# Patient Record
Sex: Male | Born: 1935
Health system: Southern US, Community
[De-identification: ages and names within clinical notes are randomized; demographics above are authoritative.]

## PROBLEM LIST (undated history)

## (undated) DIAGNOSIS — N400 Enlarged prostate without lower urinary tract symptoms: Secondary | ICD-10-CM

## (undated) DIAGNOSIS — E119 Type 2 diabetes mellitus without complications: Secondary | ICD-10-CM

## (undated) DIAGNOSIS — M75101 Unspecified rotator cuff tear or rupture of right shoulder, not specified as traumatic: Secondary | ICD-10-CM

## (undated) DIAGNOSIS — E559 Vitamin D deficiency, unspecified: Secondary | ICD-10-CM

## (undated) DIAGNOSIS — I10 Essential (primary) hypertension: Secondary | ICD-10-CM

## (undated) DIAGNOSIS — E785 Hyperlipidemia, unspecified: Secondary | ICD-10-CM

## (undated) DIAGNOSIS — K219 Gastro-esophageal reflux disease without esophagitis: Secondary | ICD-10-CM

## (undated) HISTORY — DX: Type 2 diabetes mellitus without complications: E11.9

## (undated) HISTORY — DX: Vitamin D deficiency, unspecified: E55.9

---

## 1999-02-26 ENCOUNTER — Ambulatory Visit (HOSPITAL_COMMUNITY): Admission: RE | Admit: 1999-02-26 | Discharge: 1999-02-26 | Payer: Self-pay | Admitting: Orthopedic Surgery

## 1999-02-26 ENCOUNTER — Encounter: Payer: Self-pay | Admitting: Orthopedic Surgery

## 1999-04-03 ENCOUNTER — Encounter: Payer: Self-pay | Admitting: Orthopedic Surgery

## 1999-04-06 ENCOUNTER — Encounter: Payer: Self-pay | Admitting: Orthopedic Surgery

## 1999-04-06 ENCOUNTER — Inpatient Hospital Stay (HOSPITAL_COMMUNITY): Admission: RE | Admit: 1999-04-06 | Discharge: 1999-04-07 | Payer: Self-pay | Admitting: Orthopedic Surgery

## 1999-04-06 ENCOUNTER — Encounter (INDEPENDENT_AMBULATORY_CARE_PROVIDER_SITE_OTHER): Payer: Self-pay | Admitting: *Deleted

## 1999-04-06 HISTORY — PX: LUMBAR MICRODISCECTOMY: SHX99

## 2004-04-10 ENCOUNTER — Observation Stay (HOSPITAL_COMMUNITY): Admission: RE | Admit: 2004-04-10 | Discharge: 2004-04-11 | Payer: Self-pay | Admitting: Orthopedic Surgery

## 2004-04-10 HISTORY — PX: SHOULDER ARTHROSCOPY: SHX128

## 2008-10-09 ENCOUNTER — Ambulatory Visit (HOSPITAL_COMMUNITY): Admission: RE | Admit: 2008-10-09 | Discharge: 2008-10-09 | Payer: Self-pay | Admitting: Internal Medicine

## 2010-03-09 ENCOUNTER — Ambulatory Visit
Admission: RE | Admit: 2010-03-09 | Discharge: 2010-03-10 | Payer: Self-pay | Source: Home / Self Care | Attending: Orthopedic Surgery | Admitting: Orthopedic Surgery

## 2010-03-09 HISTORY — PX: OTHER SURGICAL HISTORY: SHX169

## 2010-03-11 LAB — GLUCOSE, CAPILLARY
Glucose-Capillary: 100 mg/dL — ABNORMAL HIGH (ref 70–99)
Glucose-Capillary: 229 mg/dL — ABNORMAL HIGH (ref 70–99)
Glucose-Capillary: 198 mg/dL — ABNORMAL HIGH (ref 70–99)
Glucose-Capillary: 129 mg/dL — ABNORMAL HIGH (ref 70–99)

## 2010-03-11 LAB — POCT I-STAT 4, (NA,K, GLUC, HGB,HCT)
Glucose, Bld: 149 mg/dL — ABNORMAL HIGH (ref 70–99)
HCT: 40 % (ref 39.0–52.0)
Hemoglobin: 13.6 g/dL (ref 13.0–17.0)
Potassium: 4.4 mEq/L (ref 3.5–5.1)
Sodium: 140 mEq/L (ref 135–145)

## 2010-07-10 NOTE — Op Note (Signed)
. [facility]  Patient:    Gerald [name], Gerald [name]                     MRN: [id number] Proc. Date: 04/06/99 Adm. Date:  [id number] Attending:  Marlowe Kays Page                           Operative Report  PREOPERATIVE DIAGNOSES: 1. Herniated nucleus pulposus with free fragment L4-5 left. 2. Chronic degenerative spondylolisthesis L5-S1.  POSTOPERATIVE DIAGNOSES: 1. Herniated nucleus pulposus with free fragment L4-5 left. 2. Chronic degenerative spondylolisthesis L5-S1.  OPERATION:  Microdiskectomy L4-5 left with excision of herniated nucleus pulposus and free fragments.  SURGEON:  Illene Labrador. [place]ton, M.D.  ASSISTANT:  Philips J. Montez Morita, M.D.  ANESTHESIA:  General.  INDICATIONS FOR PROCEDURE:  He has had chronic problems with his back since childhood but this past October developed severe back and left leg pain leading to an MRI which demonstrated substantial disk herniation of L4-5 to the left with n extruded fragment caudal.  He also had a degenerative spondylolisthesis of L5 and S1 which I felt was a chronic problem and probably not the major cause for his symptoms.  The MRI also demonstrated the possibility of some spinal stenosis at  L3-4 and because of this and the L5-S1 spondylolisthesis, I had a myelogram CT can performed which demonstrated no other operable-type problems and accordingly, he is here simply for the L4-5 disk problem.  DESCRIPTION OF PROCEDURE:  Under prophylactic antibiotic and satisfactory general anesthesia, knee-chest position on the [place] frame.  Back was prepped with DuraPrep.  Draped in a sterile field.  With three spinal needles and lateral x-ray, I was tentatively able to localize the L4-5 interspace.  I made a short midline  incision at this level and tagged the two spinous processes with Kocher clamps nd took a followup lateral x-ray which demonstrated the clamps were on the spinous  processes of L4  and L5, with the superior clamp pointed straight at the L4-5 disk space and the distal clamp at about the level of the free fragment.  Based on this, I continued to dissect the soft tissue off the spinous processes of L4 and L5 and placed a self-retaining McCollough retractor.  With small ______ , I undermined  the superior portion of L5 and then with 2-, 3- and 4-mm Kerrison rongeurs, moved bone and ligamental flavum until I had sufficient working room to bring in the microscope.  Additional decompression of the L5 nerve root in the foramen was done as well as a little bit more decompression laterally.  We then identified the L5 nerve root and moved it in the dura immediately.  The L4-5 disk was identified.  Overlying it was a conservative amount of vascular tissue which I cauterized with bipolar cautery.  This disk material was opened with a #15 knife blade.  The interspace actually was slightly superior to the area I first opened with fragments of disk material being removed from the initial opening which was overlying the L5 vertebral body as depicted on the MRI.  I then began removing additional free fragment disk material overlying the L5 body and beneath the L5 nerve root and working beneath the dura.  I also used a straightened angle up by pituitaries, nerve hook, and Epstein curette to remove additional disk material from beneath the posterior longitudinal ligament and from the interspace.  We kept working until  all disk material obtainable had been removed from the interspace.  On looking through the microscope, it appeared to be empty of any appreciable disk material.  We also appeared to have thoroughly decompressed the free fragment disk material from underneath the L5 nerve root and the posterior longitudinal ligament.  I checked with a hockey stick, and the neural foramen was widely patent, and there was nothing obstructing it beneath the dura posteriorly.  I  then irrigated the wound well with sterile saline.  There was no unusual bleeding.  Gel foam was placed ver the interspace and around the L5 nerve root and then over the dura.  The self-retaining retractors were removed.  Several minimal bleeders were coagulated. The wound was dry on closure.  We closed the fascia with interrupted #1 Vicryl.  Subcutaneous tissue was infiltrated with 0.5% plain Marcaine and closed with 2-0 Vicryl and the skin with staples.  Betadine and ______ dressings were applied. He tolerated the procedure well.  At the time of this dictation, he was on the way to the recovery room in satisfactory condition with no known complications. DD:  04/06/99 TD:  04/06/99 Job: [id number]

## 2010-07-10 NOTE — Op Note (Signed)
NAMEMAHMUD, KEITHLY              ACCOUNT NO.:  [id number]   MEDICAL RECORD NO.:  [id number]          PATIENT TYPE:  OBV   LOCATION:  0098                         FACILITY:  [facility]   PHYSICIAN:  Marlowe Kays, M.D.  DATE OF BIRTH:  Jan 25, 1935   DATE OF PROCEDURE:  04/10/2004  DATE OF DISCHARGE:                                 OPERATIVE REPORT   PREOPERATIVE DIAGNOSIS:  Chronic impingement syndrome with small rotator  cuff tear, left shoulder.   POSTOPERATIVE DIAGNOSIS:  Chronic impingement syndrome with small rotator  cuff tendinopathy, left shoulder.   OPERATION:  Left shoulder arthroscopy (normal examination), arthroscopic  subacromial decompression.   SURGEON:  Marlowe Kays, M.D.   ASSISTANTDruscilla Brownie. Idolina Primer, PA-C.   ANESTHESIA:  General.   INDICATIONS FOR PROCEDURE:  Chronic progressive pain, left shoulder, with  some mild-to-moderate AC joint degenerative changes,  but he was not tender  there.  It was felt that decompression on the underneath surface would  suffice without having to resect this clavicle.  On the MRI, he was felt to  have a small full-thickness rotator cuff tear in the anterior supraspinatus  tendon, but we were unable to note this at surgery.   PROCEDURE:  Satisfactory general anesthesia, beach-chair position on the  [place] frame.  Left shoulder girdle was prepped with Duraprep and draped in  the sterile field.  The anatomy of the shoulder joint was marked out, and  the subacromial space lateral and posterior portals were infiltrated with  0.5% Marcaine with adrenaline.  Through a posterior soft spot portal, I  atraumatically entered the glenohumeral joint.  On inspection, it was normal  other than a small amount of erosion of the humeral head.  I then redirected  the scope and subacromial space and through a lateral portal placed a blunt  trocar followed by a 4.2 shaver.  He had a large amount of bursal tissue as  well as a good bit of hang  of the bursal surface of the rotator cuff.  I was  unable to find any definite full-thickness rotator cuff tear, and there was  no leakage of arthroscopic fluid from the glenohumeral joint into the  subacromial space.  After cleaning up the bursal tissue with a 4.2 shaver, I  used the ArthroCare 90-degree vaporizer to begin removing soft tissue from  the under-surface of the acromion, working around its anterior leading edge  back beneath the [facility] joint.  I then used a 4-0 oval bur to begin removing  bone from the anterior acromion and the underneath surface of the [facility] joint,  decompressing the space.  I went back and forth between the three  instruments, the bur, the vaporizer, and the 4.2 shaver until we had a wide  decompression.  At the conclusion of the case, there was no bleeding.  I had  shaved the rotator cuff, and the decompression appeared to be complete.  Patient with his arm to his side and arm abducted.  All fluid possible was  removed from the subacromial space.  I reinjected the portals and the  subacromial space with  Marcaine and adrenaline.  The two portals were closed with 4-0 nylon  followed by Betadine, Adaptic, dry sterile dressing, and shoulder  immobilizer.  He tolerated the procedure well and was taken to the recovery  room in satisfactory condition with no known complications.      JA/MEDQ  D:  04/10/2004  T:  04/10/2004  Job:  [id number]

## 2010-07-20 ENCOUNTER — Emergency Department (HOSPITAL_BASED_OUTPATIENT_CLINIC_OR_DEPARTMENT_OTHER)
Admission: EM | Admit: 2010-07-20 | Discharge: 2010-07-21 | Disposition: A | Discharge status: Home / Self Care | Payer: Medicare Other | Attending: Emergency Medicine | Admitting: Emergency Medicine

## 2010-07-20 DIAGNOSIS — E785 Hyperlipidemia, unspecified: Secondary | ICD-10-CM | POA: Insufficient documentation

## 2010-07-20 DIAGNOSIS — I10 Essential (primary) hypertension: Secondary | ICD-10-CM | POA: Insufficient documentation

## 2010-07-20 DIAGNOSIS — Z79899 Other long term (current) drug therapy: Secondary | ICD-10-CM | POA: Insufficient documentation

## 2010-07-20 DIAGNOSIS — E119 Type 2 diabetes mellitus without complications: Secondary | ICD-10-CM | POA: Insufficient documentation

## 2010-07-20 DIAGNOSIS — L02419 Cutaneous abscess of limb, unspecified: Secondary | ICD-10-CM | POA: Insufficient documentation

## 2010-07-20 LAB — BASIC METABOLIC PANEL
BUN: 28 mg/dL — ABNORMAL HIGH (ref 6–23)
CO2: 24 mEq/L (ref 19–32)
Calcium: 9.4 mg/dL (ref 8.4–10.5)
Chloride: 104 mEq/L (ref 96–112)
Creatinine, Ser: 1.1 mg/dL (ref 0.4–1.5)
GFR calc Af Amer: >60 mL/min (ref >60)
GFR calc non Af Amer: >60 mL/min (ref >60)
Glucose, Bld: 114 mg/dL — ABNORMAL HIGH (ref 70–99)
Potassium: 3.9 mEq/L (ref 3.5–5.1)
Sodium: 141 mEq/L (ref 135–145)

## 2010-07-20 LAB — DIFFERENTIAL
Basophils Absolute: 0 10*3/uL (ref 0.0–0.1)
Basophils Relative: 1 % (ref 0–1)
Eosinophils Absolute: 0.2 10*3/uL (ref 0.0–0.7)
Eosinophils Relative: 3 % (ref 0–5)
Lymphocytes Relative: 32 % (ref 12–46)
Lymphs Abs: 2.1 10*3/uL (ref 0.7–4.0)
Monocytes Absolute: 0.7 10*3/uL (ref 0.1–1.0)
Monocytes Relative: 11 % (ref 3–12)
Neutro Abs: 3.6 10*3/uL (ref 1.7–7.7)
Neutrophils Relative %: 54 % (ref 43–77)

## 2010-07-20 LAB — CBC
HCT: 33.5 % — ABNORMAL LOW (ref 39.0–52.0)
Hemoglobin: 12.2 g/dL — ABNORMAL LOW (ref 13.0–17.0)
MCH: 31.2 pg (ref 26.0–34.0)
MCHC: 36.4 g/dL — ABNORMAL HIGH (ref 30.0–36.0)
MCV: 85.7 fL (ref 78.0–100.0)
Platelets: 155 10*3/uL (ref 150–400)
RBC: 3.91 MIL/uL — ABNORMAL LOW (ref 4.22–5.81)
RDW: 12.6 % (ref 11.5–15.5)
WBC: 6.7 10*3/uL (ref 4.0–10.5)

## 2010-07-22 ENCOUNTER — Emergency Department (HOSPITAL_BASED_OUTPATIENT_CLINIC_OR_DEPARTMENT_OTHER)
Admission: EM | Admit: 2010-07-22 | Discharge: 2010-07-22 | Disposition: A | Discharge status: Home / Self Care | Payer: Medicare Other | Attending: Emergency Medicine | Admitting: Emergency Medicine

## 2010-07-22 DIAGNOSIS — Z8739 Personal history of other diseases of the musculoskeletal system and connective tissue: Secondary | ICD-10-CM | POA: Insufficient documentation

## 2010-07-22 DIAGNOSIS — I10 Essential (primary) hypertension: Secondary | ICD-10-CM | POA: Insufficient documentation

## 2010-07-22 DIAGNOSIS — E119 Type 2 diabetes mellitus without complications: Secondary | ICD-10-CM | POA: Insufficient documentation

## 2010-07-22 DIAGNOSIS — Z09 Encounter for follow-up examination after completed treatment for conditions other than malignant neoplasm: Secondary | ICD-10-CM | POA: Insufficient documentation

## 2010-07-22 DIAGNOSIS — E785 Hyperlipidemia, unspecified: Secondary | ICD-10-CM | POA: Insufficient documentation

## 2011-04-05 DIAGNOSIS — J029 Acute pharyngitis, unspecified: Secondary | ICD-10-CM | POA: Diagnosis not present

## 2011-04-16 DIAGNOSIS — J029 Acute pharyngitis, unspecified: Secondary | ICD-10-CM | POA: Diagnosis not present

## 2011-05-04 ENCOUNTER — Other Ambulatory Visit: Payer: Self-pay | Admitting: Orthopedic Surgery

## 2011-05-04 DIAGNOSIS — M25511 Pain in right shoulder: Secondary | ICD-10-CM

## 2011-05-07 ENCOUNTER — Ambulatory Visit
Admission: RE | Admit: 2011-05-07 | Discharge: 2011-05-07 | Disposition: A | Discharge status: Home / Self Care | Payer: Medicare Other | Source: Ambulatory Visit | Attending: Orthopedic Surgery | Admitting: Orthopedic Surgery

## 2011-05-07 DIAGNOSIS — S43429A Sprain of unspecified rotator cuff capsule, initial encounter: Secondary | ICD-10-CM | POA: Diagnosis not present

## 2011-05-07 DIAGNOSIS — M25519 Pain in unspecified shoulder: Secondary | ICD-10-CM | POA: Diagnosis not present

## 2011-05-07 DIAGNOSIS — M25511 Pain in right shoulder: Secondary | ICD-10-CM

## 2011-05-07 MED ORDER — IOHEXOL 300 MG/ML  SOLN
5.0000 mL | Freq: Once | INTRAMUSCULAR | Status: AC | PRN
  Administered 2011-05-07: 5 mL via INTRA_ARTICULAR

## 2011-05-17 DIAGNOSIS — E559 Vitamin D deficiency, unspecified: Secondary | ICD-10-CM | POA: Diagnosis not present

## 2011-05-17 DIAGNOSIS — E782 Mixed hyperlipidemia: Secondary | ICD-10-CM | POA: Diagnosis not present

## 2011-05-17 DIAGNOSIS — I10 Essential (primary) hypertension: Secondary | ICD-10-CM | POA: Diagnosis not present

## 2011-05-17 DIAGNOSIS — Z79899 Other long term (current) drug therapy: Secondary | ICD-10-CM | POA: Diagnosis not present

## 2011-05-17 DIAGNOSIS — E109 Type 1 diabetes mellitus without complications: Secondary | ICD-10-CM | POA: Diagnosis not present

## 2011-05-18 DIAGNOSIS — M75120 Complete rotator cuff tear or rupture of unspecified shoulder, not specified as traumatic: Secondary | ICD-10-CM | POA: Diagnosis not present

## 2011-05-20 DIAGNOSIS — M542 Cervicalgia: Secondary | ICD-10-CM | POA: Diagnosis not present

## 2011-05-20 DIAGNOSIS — M999 Biomechanical lesion, unspecified: Secondary | ICD-10-CM | POA: Diagnosis not present

## 2011-05-20 DIAGNOSIS — M9981 Other biomechanical lesions of cervical region: Secondary | ICD-10-CM | POA: Diagnosis not present

## 2011-05-27 ENCOUNTER — Other Ambulatory Visit: Payer: Self-pay | Admitting: Orthopedic Surgery

## 2011-06-03 ENCOUNTER — Encounter (HOSPITAL_BASED_OUTPATIENT_CLINIC_OR_DEPARTMENT_OTHER): Payer: Self-pay | Admitting: *Deleted

## 2011-06-04 ENCOUNTER — Encounter (HOSPITAL_BASED_OUTPATIENT_CLINIC_OR_DEPARTMENT_OTHER): Payer: Self-pay | Admitting: *Deleted

## 2011-06-04 NOTE — Progress Notes (Signed)
NPO AFTER MN. ARRIVES AT 0700. NEEDS ISTAT AND EKG. WILL TAKE LABETALOL AND LEVOTHYROID AM OF SURG W/ SIP OF WATER. NO LANTUS  AM OF SURG. 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

## 2011-06-09 ENCOUNTER — Encounter (HOSPITAL_BASED_OUTPATIENT_CLINIC_OR_DEPARTMENT_OTHER): Payer: Self-pay | Admitting: Anesthesiology

## 2011-06-09 ENCOUNTER — Encounter (HOSPITAL_COMMUNITY)
Admission: RE | Disposition: A | Discharge status: Home / Self Care | Payer: Self-pay | Source: Ambulatory Visit | Attending: Orthopedic Surgery

## 2011-06-09 ENCOUNTER — Encounter (HOSPITAL_BASED_OUTPATIENT_CLINIC_OR_DEPARTMENT_OTHER): Payer: Self-pay | Admitting: *Deleted

## 2011-06-09 ENCOUNTER — Ambulatory Visit (HOSPITAL_BASED_OUTPATIENT_CLINIC_OR_DEPARTMENT_OTHER)
Admission: RE | Admit: 2011-06-09 | Discharge: 2011-06-10 | Disposition: A | Discharge status: Home / Self Care | Payer: Medicare Other | Source: Ambulatory Visit | Attending: Orthopedic Surgery | Admitting: Orthopedic Surgery

## 2011-06-09 ENCOUNTER — Ambulatory Visit (HOSPITAL_BASED_OUTPATIENT_CLINIC_OR_DEPARTMENT_OTHER): Payer: Medicare Other | Admitting: Anesthesiology

## 2011-06-09 DIAGNOSIS — K219 Gastro-esophageal reflux disease without esophagitis: Secondary | ICD-10-CM | POA: Diagnosis not present

## 2011-06-09 DIAGNOSIS — I10 Essential (primary) hypertension: Secondary | ICD-10-CM | POA: Diagnosis not present

## 2011-06-09 DIAGNOSIS — M75100 Unspecified rotator cuff tear or rupture of unspecified shoulder, not specified as traumatic: Secondary | ICD-10-CM

## 2011-06-09 DIAGNOSIS — Z79899 Other long term (current) drug therapy: Secondary | ICD-10-CM | POA: Insufficient documentation

## 2011-06-09 DIAGNOSIS — M19019 Primary osteoarthritis, unspecified shoulder: Secondary | ICD-10-CM | POA: Insufficient documentation

## 2011-06-09 DIAGNOSIS — M67919 Unspecified disorder of synovium and tendon, unspecified shoulder: Secondary | ICD-10-CM | POA: Insufficient documentation

## 2011-06-09 DIAGNOSIS — S43499A Other sprain of unspecified shoulder joint, initial encounter: Secondary | ICD-10-CM | POA: Diagnosis not present

## 2011-06-09 DIAGNOSIS — E119 Type 2 diabetes mellitus without complications: Secondary | ICD-10-CM | POA: Insufficient documentation

## 2011-06-09 DIAGNOSIS — S46819A Strain of other muscles, fascia and tendons at shoulder and upper arm level, unspecified arm, initial encounter: Secondary | ICD-10-CM | POA: Diagnosis not present

## 2011-06-09 DIAGNOSIS — M719 Bursopathy, unspecified: Secondary | ICD-10-CM | POA: Insufficient documentation

## 2011-06-09 DIAGNOSIS — M25519 Pain in unspecified shoulder: Secondary | ICD-10-CM | POA: Diagnosis not present

## 2011-06-09 DIAGNOSIS — E785 Hyperlipidemia, unspecified: Secondary | ICD-10-CM | POA: Diagnosis not present

## 2011-06-09 DIAGNOSIS — S43429A Sprain of unspecified rotator cuff capsule, initial encounter: Secondary | ICD-10-CM | POA: Diagnosis not present

## 2011-06-09 DIAGNOSIS — G8918 Other acute postprocedural pain: Secondary | ICD-10-CM | POA: Diagnosis not present

## 2011-06-09 HISTORY — DX: Essential (primary) hypertension: I10

## 2011-06-09 HISTORY — DX: Gastro-esophageal reflux disease without esophagitis: K21.9

## 2011-06-09 HISTORY — DX: Hyperlipidemia, unspecified: E78.5

## 2011-06-09 HISTORY — DX: Unspecified rotator cuff tear or rupture of right shoulder, not specified as traumatic: M75.101

## 2011-06-09 HISTORY — PX: SHOULDER OPEN ROTATOR CUFF REPAIR: SHX2407

## 2011-06-09 HISTORY — DX: Benign prostatic hyperplasia without lower urinary tract symptoms: N40.0

## 2011-06-09 LAB — POCT I-STAT 4, (NA,K, GLUC, HGB,HCT)
Glucose, Bld: 186 mg/dL — ABNORMAL HIGH (ref 70–99)
HCT: 38 % — ABNORMAL LOW (ref 39.0–52.0)
Potassium: 4.4 mEq/L (ref 3.5–5.1)

## 2011-06-09 SURGERY — REPAIR, ROTATOR CUFF, OPEN
Anesthesia: General | Site: Shoulder | Laterality: Right | Wound class: Clean

## 2011-06-09 MED ORDER — PHENOL 1.4 % MT LIQD: 1.0000 | OROMUCOSAL | Status: DC | PRN

## 2011-06-09 MED ORDER — LIDOCAINE HCL (CARDIAC) 20 MG/ML IV SOLN
INTRAVENOUS | Status: DC | PRN
  Administered 2011-06-09: 60 mg via INTRAVENOUS

## 2011-06-09 MED ORDER — HYDROMORPHONE HCL PF 1 MG/ML IJ SOLN
1.0000 mg | INTRAMUSCULAR | Status: DC | PRN
  Administered 2011-06-09: 1 mg via INTRAVENOUS
  Filled 2011-06-09: qty 1

## 2011-06-09 MED ORDER — VITAMIN D3 25 MCG (1000 UNIT) PO TABS
1000.0000 [IU] | ORAL_TABLET | Freq: Every day | ORAL | Status: DC
  Filled 2011-06-09: qty 1

## 2011-06-09 MED ORDER — METOCLOPRAMIDE HCL 5 MG PO TABS
5.0000 mg | ORAL_TABLET | Freq: Three times a day (TID) | ORAL | Status: DC | PRN
  Filled 2011-06-09: qty 2

## 2011-06-09 MED ORDER — LEVOTHYROXINE SODIUM 150 MCG PO TABS
150.0000 ug | ORAL_TABLET | Freq: Every morning | ORAL | Status: DC
  Filled 2011-06-09: qty 1

## 2011-06-09 MED ORDER — SIMVASTATIN 40 MG PO TABS: 40.0000 mg | ORAL_TABLET | ORAL | Status: DC

## 2011-06-09 MED ORDER — LABETALOL HCL 100 MG PO TABS
100.0000 mg | ORAL_TABLET | Freq: Two times a day (BID) | ORAL | Status: DC
  Administered 2011-06-09: 100 mg via ORAL
  Filled 2011-06-09 (×3): qty 1

## 2011-06-09 MED ORDER — OXYCODONE-ACETAMINOPHEN 5-325 MG PO TABS
1.0000 | ORAL_TABLET | ORAL | Status: DC | PRN
  Administered 2011-06-09 (×2): 1 via ORAL
  Administered 2011-06-09 – 2011-06-10 (×3): 2 via ORAL
  Filled 2011-06-09 (×3): qty 2

## 2011-06-09 MED ORDER — FINASTERIDE 5 MG PO TABS
5.0000 mg | ORAL_TABLET | Freq: Every day | ORAL | Status: DC
Start: 2011-06-10 — End: 2011-06-10
  Filled 2011-06-09: qty 1

## 2011-06-09 MED ORDER — CELECOXIB 200 MG PO CAPS
200.0000 mg | ORAL_CAPSULE | Freq: Every morning | ORAL | Status: DC
  Filled 2011-06-09: qty 1

## 2011-06-09 MED ORDER — INSULIN ASPART 100 UNIT/ML ~~LOC~~ SOLN
0.0000 [IU] | SUBCUTANEOUS | Status: DC
  Administered 2011-06-09: 5 [IU] via SUBCUTANEOUS

## 2011-06-09 MED ORDER — POVIDONE-IODINE 7.5 % EX SOLN: Freq: Once | CUTANEOUS | Status: DC

## 2011-06-09 MED ORDER — MIDAZOLAM HCL 2 MG/2ML IJ SOLN
2.0000 mg | Freq: Once | INTRAMUSCULAR | Status: AC
  Administered 2011-06-09: 2 mg via INTRAVENOUS

## 2011-06-09 MED ORDER — METOCLOPRAMIDE HCL 5 MG/ML IJ SOLN: 5.0000 mg | Freq: Three times a day (TID) | INTRAMUSCULAR | Status: DC | PRN

## 2011-06-09 MED ORDER — ONDANSETRON HCL 4 MG/2ML IJ SOLN: 4.0000 mg | Freq: Four times a day (QID) | INTRAMUSCULAR | Status: DC | PRN

## 2011-06-09 MED ORDER — PROPOFOL 10 MG/ML IV EMUL
INTRAVENOUS | Status: DC | PRN
  Administered 2011-06-09: 200 mg via INTRAVENOUS

## 2011-06-09 MED ORDER — EPHEDRINE SULFATE 50 MG/ML IJ SOLN
INTRAMUSCULAR | Status: DC | PRN
  Administered 2011-06-09 (×2): 10 mg via INTRAVENOUS

## 2011-06-09 MED ORDER — ROPIVACAINE HCL 5 MG/ML IJ SOLN
INTRAMUSCULAR | Status: DC | PRN
  Administered 2011-06-09: 25 mL via EPIDURAL

## 2011-06-09 MED ORDER — OMEGA-3 FATTY ACIDS 1000 MG PO CAPS: 2.0000 g | ORAL_CAPSULE | Freq: Every day | ORAL | Status: DC

## 2011-06-09 MED ORDER — LOSARTAN POTASSIUM-HCTZ 100-25 MG PO TABS: 1.0000 | ORAL_TABLET | Freq: Every morning | ORAL | Status: DC

## 2011-06-09 MED ORDER — FENTANYL CITRATE 0.05 MG/ML IJ SOLN
25.0000 ug | INTRAMUSCULAR | Status: DC | PRN
  Administered 2011-06-09 (×4): 25 ug via INTRAVENOUS

## 2011-06-09 MED ORDER — ONE-DAILY MULTI VITAMINS PO TABS: 1.0000 | ORAL_TABLET | Freq: Every day | ORAL | Status: DC

## 2011-06-09 MED ORDER — LACTATED RINGERS IV SOLN
INTRAVENOUS | Status: DC
  Administered 2011-06-09 (×2): via INTRAVENOUS

## 2011-06-09 MED ORDER — ACETAMINOPHEN 650 MG RE SUPP: 650.0000 mg | Freq: Four times a day (QID) | RECTAL | Status: DC | PRN

## 2011-06-09 MED ORDER — FENTANYL CITRATE 0.05 MG/ML IJ SOLN
INTRAMUSCULAR | Status: DC | PRN
  Administered 2011-06-09: 25 ug via INTRAVENOUS
  Administered 2011-06-09: 50 ug via INTRAVENOUS
  Administered 2011-06-09: 25 ug via INTRAVENOUS

## 2011-06-09 MED ORDER — 0.9 % SODIUM CHLORIDE (POUR BTL) OPTIME
TOPICAL | Status: DC | PRN
  Administered 2011-06-09: 500 mL

## 2011-06-09 MED ORDER — FINASTERIDE 5 MG PO TABS
12.5000 mg | ORAL_TABLET | Freq: Every day | ORAL | Status: DC
  Filled 2011-06-09: qty 2.5

## 2011-06-09 MED ORDER — EZETIMIBE 10 MG PO TABS
10.0000 mg | ORAL_TABLET | Freq: Every morning | ORAL | Status: DC
  Filled 2011-06-09: qty 1

## 2011-06-09 MED ORDER — ACETAMINOPHEN 325 MG PO TABS: 650.0000 mg | ORAL_TABLET | Freq: Four times a day (QID) | ORAL | Status: DC | PRN

## 2011-06-09 MED ORDER — DEXTROSE-NACL 5-0.45 % IV SOLN
INTRAVENOUS | Status: DC
  Administered 2011-06-09: 21:00:00 via INTRAVENOUS

## 2011-06-09 MED ORDER — SUCCINYLCHOLINE CHLORIDE 20 MG/ML IJ SOLN
INTRAMUSCULAR | Status: DC | PRN
  Administered 2011-06-09: 100 mg via INTRAVENOUS

## 2011-06-09 MED ORDER — CEFAZOLIN SODIUM-DEXTROSE 2-3 GM-% IV SOLR: 2.0000 g | INTRAVENOUS | Status: DC

## 2011-06-09 MED ORDER — ONDANSETRON HCL 4 MG PO TABS: 4.0000 mg | ORAL_TABLET | Freq: Four times a day (QID) | ORAL | Status: DC | PRN

## 2011-06-09 MED ORDER — MENTHOL 3 MG MT LOZG: 1.0000 | LOZENGE | OROMUCOSAL | Status: DC | PRN

## 2011-06-09 MED ORDER — CEFAZOLIN SODIUM-DEXTROSE 2-3 GM-% IV SOLR
2.0000 g | INTRAVENOUS | Status: AC
  Administered 2011-06-09: 2 g via INTRAVENOUS

## 2011-06-09 MED ORDER — CEFAZOLIN SODIUM-DEXTROSE 2-3 GM-% IV SOLR
2.0000 g | Freq: Four times a day (QID) | INTRAVENOUS | Status: AC
  Administered 2011-06-09 – 2011-06-10 (×3): 2 g via INTRAVENOUS
  Filled 2011-06-09 (×3): qty 50

## 2011-06-09 MED ORDER — CEFAZOLIN SODIUM-DEXTROSE 2-3 GM-% IV SOLR
2.0000 g | Freq: Four times a day (QID) | INTRAVENOUS | Status: DC
  Filled 2011-06-09 (×2): qty 50

## 2011-06-09 MED ORDER — INSULIN GLARGINE 100 UNIT/ML ~~LOC~~ SOLN: 40.0000 [IU] | Freq: Every morning | SUBCUTANEOUS | Status: DC

## 2011-06-09 MED ORDER — PANTOPRAZOLE SODIUM 40 MG PO TBEC
40.0000 mg | DELAYED_RELEASE_TABLET | Freq: Every day | ORAL | Status: DC
  Filled 2011-06-09: qty 1

## 2011-06-09 MED ORDER — ONDANSETRON HCL 4 MG/2ML IJ SOLN
INTRAMUSCULAR | Status: DC | PRN
  Administered 2011-06-09: 4 mg via INTRAVENOUS

## 2011-06-09 MED ORDER — METFORMIN HCL 500 MG PO TABS
2000.0000 mg | ORAL_TABLET | Freq: Every evening | ORAL | Status: DC
  Administered 2011-06-09: 2000 mg via ORAL
  Filled 2011-06-09 (×2): qty 4

## 2011-06-09 SURGICAL SUPPLY — 63 items
ANCH SUT 2 5.5 BABSR ASCP (Orthopedic Implant) ×2 IMPLANT
ANCHOR PEEK ZIP 5.5 NDL NO2 (Orthopedic Implant) ×2 IMPLANT
APL SKNCLS STERI-STRIP NONHPOA (GAUZE/BANDAGES/DRESSINGS) ×2
BENZOIN TINCTURE PRP APPL 2/3 (GAUZE/BANDAGES/DRESSINGS) ×3 IMPLANT
BLADE 4.2CUDA (BLADE) ×3 IMPLANT
BLADE CUDA 4.2 (BLADE) IMPLANT
BLADE FLAT COURSE (BLADE) ×2 IMPLANT
BLADE SURG 10 STRL SS (BLADE) ×2 IMPLANT
BLADE SURG 15 STRL LF DISP TIS (BLADE) ×1 IMPLANT
BLADE SURG 15 STRL SS (BLADE) ×3
BUR OVAL 4.0 (BURR) ×1 IMPLANT
CANISTER SUCT LVC 12 LTR MEDI- (MISCELLANEOUS) ×2 IMPLANT
CANISTER SUCTION 1200CC (MISCELLANEOUS) ×3 IMPLANT
CLOTH BEACON ORANGE TIMEOUT ST (SAFETY) ×3 IMPLANT
DRAPE LG THREE QUARTER DISP (DRAPES) ×4 IMPLANT
DRAPE SHOULDER BEACH CHAIR (DRAPES) ×3 IMPLANT
DRAPE U-SHAPE 47X51 STRL (DRAPES) ×3 IMPLANT
DRSG ADAPTIC 3X8 NADH LF (GAUZE/BANDAGES/DRESSINGS) ×3 IMPLANT
DRSG PAD ABDOMINAL 8X10 ST (GAUZE/BANDAGES/DRESSINGS) ×3 IMPLANT
DURAPREP 26ML APPLICATOR (WOUND CARE) ×3 IMPLANT
GLOVE BIOGEL PI IND STRL 8 (GLOVE) ×2 IMPLANT
GLOVE BIOGEL PI INDICATOR 8 (GLOVE) ×1
GLOVE ECLIPSE 8.0 STRL XLNG CF (GLOVE) ×4 IMPLANT
GLOVE INDICATOR 8.0 STRL GRN (GLOVE) ×5 IMPLANT
GOWN PREVENTION PLUS LG XLONG (DISPOSABLE) ×5 IMPLANT
GOWN STRL REIN XL XLG (GOWN DISPOSABLE) ×4 IMPLANT
IV NS IRRIG 3000ML ARTHROMATIC (IV SOLUTION) ×6 IMPLANT
NDL HYPO 18GX1.5 BLUNT FILL (NEEDLE) ×1 IMPLANT
NDL SAFETY ECLIPSE 18X1.5 (NEEDLE) ×2 IMPLANT
NEEDLE HYPO 18GX1.5 BLUNT FILL (NEEDLE) ×3 IMPLANT
NEEDLE HYPO 18GX1.5 SHARP (NEEDLE) ×3
NEEDLE HYPO 22GX1.5 SAFETY (NEEDLE) ×3 IMPLANT
NS IRRIG 500ML POUR BTL (IV SOLUTION) ×3 IMPLANT
PACK ARTHROSCOPY DSU (CUSTOM PROCEDURE TRAY) ×3 IMPLANT
PACK BASIN DAY SURGERY FS (CUSTOM PROCEDURE TRAY) ×3 IMPLANT
PATCH TISSUE MEND 3X3CM (Orthopedic Implant) ×2 IMPLANT
PENCIL BUTTON HOLSTER BLD 10FT (ELECTRODE) ×2 IMPLANT
SET ARTHROSCOPY TUBING (MISCELLANEOUS) ×3
SET ARTHROSCOPY TUBING LN (MISCELLANEOUS) ×2 IMPLANT
SLING ARM IMMOBILIZER LRG (SOFTGOODS) ×2 IMPLANT
SPONGE GAUZE 4X4 12PLY (GAUZE/BANDAGES/DRESSINGS) ×3 IMPLANT
SPONGE LAP 4X18 X RAY DECT (DISPOSABLE) ×2 IMPLANT
SPONGE SURGIFOAM ABS GEL 100 (HEMOSTASIS) IMPLANT
STAPLER VISISTAT 35W (STAPLE) IMPLANT
STRIP CLOSURE SKIN 1/2X4 (GAUZE/BANDAGES/DRESSINGS) ×3 IMPLANT
SUCTION FRAZIER TIP 10 FR DISP (SUCTIONS) ×3 IMPLANT
SUT BONE WAX W31G (SUTURE) ×3 IMPLANT
SUT ETHIBOND GREEN BRAID 0S 4 (SUTURE) IMPLANT
SUT ETHIBOND NAB CT1 #1 30IN (SUTURE) IMPLANT
SUT ETHILON 4 0 PS 2 18 (SUTURE) ×6 IMPLANT
SUT VIC AB 0 CT1 36 (SUTURE) IMPLANT
SUT VIC AB 1 CT1 36 (SUTURE) ×6 IMPLANT
SUT VIC AB 2-0 CT1 27 (SUTURE) ×6
SUT VIC AB 2-0 CT1 TAPERPNT 27 (SUTURE) ×4 IMPLANT
SUT VIC AB 3-0 CT1 27 (SUTURE) ×3
SUT VIC AB 3-0 CT1 TAPERPNT 27 (SUTURE) ×1 IMPLANT
SUT VICRYL 4-0 PS2 18IN ABS (SUTURE) ×2 IMPLANT
SYR BULB IRRIGATION 50ML (SYRINGE) ×3 IMPLANT
SYRINGE 10CC LL (SYRINGE) ×3 IMPLANT
TAPE CLOTH SURG 6X10 WHT LF (GAUZE/BANDAGES/DRESSINGS) ×2 IMPLANT
TOWEL OR 17X24 6PK STRL BLUE (TOWEL DISPOSABLE) ×3 IMPLANT
TUBE CONNECTING 12X1/4 (SUCTIONS) ×3 IMPLANT
WAND 90 DEG TURBOVAC W/CORD (SURGICAL WAND) ×3 IMPLANT

## 2011-06-09 NOTE — OR Nursing (Signed)
Primary time out and patient check in done by Hal Hope RN and Lorrin Jackson CRNA out in the holding room prior to patient coming back for surgery.  Documented by Cherylann Parr RN

## 2011-06-09 NOTE — Anesthesia Preprocedure Evaluation (Signed)
Anesthesia Evaluation  Patient identified by MRN, date of birth, ID band Patient awake    Reviewed: Allergy & Precautions, H&P , NPO status , Patient's Chart, lab work & pertinent test results, reviewed documented beta blocker date and time   Airway Mallampati: II TM Distance: >3 FB Neck ROM: Full    Dental  (+) Teeth Intact and Dental Advisory Given   Pulmonary neg pulmonary ROS,  breath sounds clear to auscultation        Cardiovascular hypertension, Pt. on medications Rhythm:Regular Rate:Normal  Denies cardiac symptoms   Neuro/Psych negative neurological ROS  negative psych ROS   GI/Hepatic negative GI ROS, Neg liver ROS,   Endo/Other  Diabetes mellitus-, Well Controlled, Type 2, Insulin DependentHyperthyroidism No AM insulin Thyroid replacement  Renal/GU negative Renal ROS  negative genitourinary   Musculoskeletal negative musculoskeletal ROS (+)   Abdominal   Peds negative pediatric ROS (+)  Hematology negative hematology ROS (+)   Anesthesia Other Findings   Reproductive/Obstetrics negative OB ROS                           Anesthesia Physical Anesthesia Plan  ASA: III  Anesthesia Plan: General   Post-op Pain Management:    Induction: Intravenous  Airway Management Planned: Oral ETT  Additional Equipment:   Intra-op Plan:   Post-operative Plan: Extubation in OR  Informed Consent:   Dental advisory given  Plan Discussed with: CRNA and Surgeon  Anesthesia Plan Comments:         Anesthesia Quick Evaluation

## 2011-06-09 NOTE — Brief Op Note (Signed)
06/09/2011  10:26 AM  PATIENT:  Gerald [name]  77 y.o. male  PRE-OPERATIVE DIAGNOSIS:  rt shoulder recurrent rotator cuff tear  POST-OPERATIVE DIAGNOSIS:  rt shoulder recurrent rotator cuff tear  PROCEDURE:  Procedure(s) (LRB): ROTATOR CUFF REPAIR SHOULDER OPEN (Right) with anterior acromionectomy, Tissue Mend graft and rotator cuff 4 strand anchor  SURGEON:  Surgeon(s) and Role:    * Drucilla Schmidt, MD - Primary  PHYSICIAN ASSISTANT:   ASSISTANTS: nurse  ANESTHESIA:   regional and general  EBL:  Total I/O In: 1000 [I.V.:1000] Out: -   BLOOD ADMINISTERED:none  DRAINS: none   LOCAL MEDICATIONS USED:  NONE  SPECIMEN:  No Specimen  DISPOSITION OF SPECIMEN:  N/A  COUNTS:  YES  TOURNIQUET:  * No tourniquets in log *  DICTATION: .Other Dictation: Dictation Number [id number]  PLAN OF CARE: Admit for overnight observation  PATIENT DISPOSITION:  PACU - hemodynamically stable.   Delay start of Pharmacological VTE agent (>24hrs) due to surgical blood loss or risk of bleeding: not applicable

## 2011-06-09 NOTE — Anesthesia Procedure Notes (Addendum)
Anesthesia Regional Block:  Interscalene brachial plexus block  Pre-Anesthetic Checklist: ,, timeout performed, Correct Patient, Correct Site, Correct Laterality, Correct Procedure, Correct Position, site marked, Risks and benefits discussed,  Surgical consent,  Pre-op evaluation,  At surgeon's request and post-op pain management  Laterality: Right  Prep: Betadine       Needles:  Injection technique: Single-shot  Needle Type: Stimulator Needle - 40     Needle Length: 10cm 10 cm   Needle insertion depth: 2 cm   Additional Needles:  Procedures: nerve stimulator Interscalene brachial plexus block  Nerve Stimulator or Paresthesia:  Response: 0.6 mA, 2 cm  Additional Responses:   Narrative:  Start time: 06/09/2011 8:11 AM End time: 06/09/2011 8:21 AM  Performed by: Personally  Anesthesiologist: Shireen Quan  Interscalene brachial plexus block Procedure Name: Intubation Date/Time: 06/09/2011 8:58 AM Performed by: Fran Lowes Pre-anesthesia Checklist: Patient identified, Emergency Drugs available, Suction available and Patient being monitored Patient Re-evaluated:Patient Re-evaluated prior to inductionOxygen Delivery Method: Circle System Utilized Preoxygenation: Pre-oxygenation with 100% oxygen Intubation Type: IV induction Ventilation: Mask ventilation without difficulty and Oral airway inserted - appropriate to patient size Laryngoscope Size: Mac and 4 Grade View: Grade II Tube type: Oral Tube size: 8.0 mm Number of attempts: 1 Airway Equipment and Method: stylet,  oral airway and LTA kit utilized Placement Confirmation: ETT inserted through vocal cords under direct vision,  positive ETCO2 and breath sounds checked- equal and bilateral Tube secured with: Tape Dental Injury: Teeth and Oropharynx as per pre-operative assessment

## 2011-06-09 NOTE — H&P (Signed)
Gerald [name] is an 77 y.o. male.   Chief Complaintpainfulrt shoulder HPI: arthrogram demonstrates leakage of contrast into subacromial space  Past Medical History  Diagnosis Date  . Hypertension   . Hyperlipidemia   . Arthritis of shoulder BILATERAL  . GERD (gastroesophageal reflux disease)   . BPH (benign prostatic hypertrophy)   . Hyperthyroidism   . Rotator cuff tear, right RECURRENT  . Diabetes mellitus ORAL MED AND INSULIN    Past Surgical History  Procedure Date  . Right shoulder arthroscopy/ labral & biceps tendon debridement/ distal clavicle decompression/ sad/ mini open rotator cuff repair 03-09-2010  . Shoulder arthroscopy 04-10-2004    LEFT  . Lumbar microdiscectomy 04-06-1999    L4 - 5    History reviewed. No pertinent family history. Social History:  reports that he quit smoking about 21 years ago. His smoking use included Cigarettes. He has never used smokeless tobacco. He reports that he drinks alcohol. He reports that he does not use illicit drugs.  Allergies: No Known Allergies  Medications Prior to Admission  Medication Dose Route Frequency Provider Last Rate Last Dose  . ceFAZolin (ANCEF) IVPB 2 g/50 mL premix  2 g Intravenous 60 min Pre-Op Illene Labrador Kraven Calk, MD      . insulin aspart (novoLOG) injection 0-15 Units  0-15 Units Subcutaneous Q4H Jill Side, MD      . lactated ringers infusion   Intravenous Continuous Jill Side, MD 50 mL/hr at 06/09/11 [phone]    . midazolam (VERSED) injection 2 mg  2 mg Intravenous Once Jill Side, MD   2 mg at 06/09/11 9604  . povidone-iodine (BETADINE) 7.5 % scrub   Topical Once Drucilla Schmidt, MD       Medications Prior to Admission  Medication Sig Dispense Refill  . celecoxib (CELEBREX) 200 MG capsule Take 200 mg by mouth every morning.      . cholecalciferol (VITAMIN D) 1000 UNITS tablet Take 1,000 Units by mouth daily.      Marland Kitchen esomeprazole (NEXIUM) 40 MG capsule Take 40 mg by mouth every  evening.       . ezetimibe (ZETIA) 10 MG tablet Take 10 mg by mouth every morning.       . finasteride (PROSCAR) 5 MG tablet Take 12.5 mg by mouth daily.      . fish oil-omega-3 fatty acids 1000 MG capsule Take 2 g by mouth daily.      . insulin glargine (LANTUS) 100 UNIT/ML injection Inject 40 Units into the skin every morning.      . labetalol (NORMODYNE) 100 MG tablet Take 100 mg by mouth 2 (two) times daily. 100MG  IN AM / 200MG  PM      . levothyroxine (SYNTHROID, LEVOTHROID) 150 MCG tablet Take 150 mcg by mouth every morning.      Marland Kitchen losartan-hydrochlorothiazide (HYZAAR) 100-25 MG per tablet Take 1 tablet by mouth every morning.      . metFORMIN (GLUCOPHAGE) 500 MG tablet Take 2,000 mg by mouth every evening.       . Multiple Vitamin (MULTIVITAMIN) tablet Take 1 tablet by mouth daily.      . simvastatin (ZOCOR) 40 MG tablet Take 40 mg by mouth every other day.         No results found for this or any previous visit (from the past 48 hour(s)). No results found.  ROS  Blood pressure 120/85, pulse 57, temperature 97.8 F (36.6 C), temperature source Oral, resp. rate 18, height  5\' 9"  (1.753 m), weight 78.472 kg (173 lb), SpO2 100.00%. Physical Exam  Constitutional: He is oriented to person, place, and time. He appears well-developed and well-nourished.  HENT:  Head: Normocephalic and atraumatic.  Right Ear: External ear normal.  Left Ear: External ear normal.  Eyes: Conjunctivae and EOM are normal. Pupils are equal, round, and reactive to light.  Neck: Normal range of motion. Neck supple.  Cardiovascular: Normal rate, regular rhythm, normal heart sounds and intact distal pulses.   Respiratory: Effort normal and breath sounds normal.  GI: Soft. Bowel sounds are normal.  Musculoskeletal:       Has had interscalene block  Neurological: He is alert and oriented to person, place, and time. He has normal reflexes.  Skin: Skin is warm and dry.  Psychiatric: He has a normal mood and affect.  His behavior is normal. Judgment and thought content normal.     Assessment/Plan Recurrent rotator cuff tear rt shoulder Repair of recurrent rotator cuff tear rt shoulder  Shellby Schlink P 06/09/2011, 8:34 AM

## 2011-06-09 NOTE — Anesthesia Postprocedure Evaluation (Signed)
  Anesthesia Post-op Note  Patient: Gerald [name]  Procedure(s) Performed: Procedure(s) (LRB): ROTATOR CUFF REPAIR SHOULDER OPEN (Right)  Patient Location: PACU  Anesthesia Type: GA combined with regional for post-op pain  Level of Consciousness: oriented and sedated  Airway and Oxygen Therapy: Patient Spontanous Breathing and Patient connected to nasal cannula oxygen  Post-op Pain: mild  Post-op Assessment: Post-op Vital signs reviewed, Patient's Cardiovascular Status Stable, Respiratory Function Stable and Patent Airway  Post-op Vital Signs: stable  Complications: No apparent anesthesia complications

## 2011-06-09 NOTE — Transfer of Care (Signed)
Immediate Anesthesia Transfer of Care Note  Patient: Gerald [name]  Procedure(s) Performed: Procedure(s) (LRB): ROTATOR CUFF REPAIR SHOULDER OPEN (Right)  Patient Location: Patient transported to PACU with oxygen via face mask at 4 Liters / Min  Anesthesia Type: General  Level of Consciousness: awake and alert   Airway & Oxygen Therapy: Patient Spontanous Breathing and Patient connected to face mask oxygen  Post-op Assessment: Report given to PACU RN and Post -op Vital signs reviewed and stable  Post vital signs: Reviewed and stable  Dentition: Teeth and oropharynx remain in pre-op condition  Complications: No apparent anesthesia complications

## 2011-06-10 ENCOUNTER — Encounter (HOSPITAL_BASED_OUTPATIENT_CLINIC_OR_DEPARTMENT_OTHER): Payer: Self-pay | Admitting: Orthopedic Surgery

## 2011-06-10 LAB — GLUCOSE, CAPILLARY: Glucose-Capillary: 172 mg/dL — ABNORMAL HIGH (ref 70–99)

## 2011-06-10 MED ORDER — OXYCODONE-ACETAMINOPHEN 7.5-325 MG PO TABS: 1.0000 | ORAL_TABLET | ORAL | Status: AC | PRN

## 2011-06-10 MED ORDER — METHOCARBAMOL 500 MG PO TABS: 500.0000 mg | ORAL_TABLET | Freq: Four times a day (QID) | ORAL | Status: AC

## 2011-06-10 NOTE — Progress Notes (Signed)
Patient ID: Gerald [name], male   DOB: Nov 05, 1934, 77 y.o.   MRN: [id number] He had a good bit of pain last night butfeels ready to go home.  Post-op instructions given.  Return my office 4/22.  Condition at dischargr-- stable.  Drucilla Schmidt MD

## 2011-06-10 NOTE — Op Note (Signed)
[name], Gerald [name]              ACCOUNT NO.:  [id number]  MEDICAL RECORD NO.:  [id number]  LOCATION:  1507                         FACILITY:  [facility]  PHYSICIAN:  Marlowe Kays, M.D.  DATE OF BIRTH:  09-24-1934  DATE OF PROCEDURE:  06/09/2011 DATE OF DISCHARGE:                              OPERATIVE REPORT   PREOPERATIVE DIAGNOSIS:  Recurrent rotator cuff tear, right shoulder.  POSTOPERATIVE DIAGNOSIS:  Recurrent rotator cuff tear, right shoulder.  OPERATION:  Anterior acromionectomy with repair of recurrent rotator cuff tear, right shoulder, using TissueMend graft and Stryker rotator cuff anchor.  SURGEON:  Marlowe Kays, M.D.  ASSISTANT:  Nurse.  ANESTHESIA:  General preceded by interscalene block.  PATHOLOGY AND JUSTIFICATION FOR PROCEDURE:  In 1996, he had his original rotator cuff tear repaired by me following an injury.  I re-repaired it in January of last year and unfortunately he had recurrence of pain since around the end of last year with an arthrogram on 05/07/2011, demonstrating extravasation of dye through the rotator cuff into the subacromial space indicating a recurrent tear.  PROCEDURE IN DETAIL:  Prophylactic antibiotics, satisfactory interscalene block, satisfactory general anesthesia, beachchair position on the sliding frame, right shoulder girdle was prepped with DuraPrep, draped in sterile field.  Ioban employed.  Time-out performed.  I went through the old surgical incision and with subperiosteal dissection, reflected fascia off the residual anterior acromion.  I undermined the anterior acromion with a small Cobb elevator and joint fluid came forth confirming the tear.  After widening the subacromial space.  I found that it was tightly impinging on the rotator cuff.  Despite prior anterior acromionectomy.  I then removed additional bone from the subacromial space.  Using combination of micro saw and small rongeur until he had a wide decompression.   The recurrent tear was found.  It was really quite obvious just above the greater tuberosity and measured about 2.5 cm vertically with slight gapping less than 1 cm from front to back, I have looked and did not see any other tear with the remainder of the rotator cuff which looked to be pristine.  I debrided up the ragged edges of the tear and removed prior suture anchor.  I then roughened up the greater tuberosity with a small rongeur and placed another suture anchor, Stryker type with 4 strands and advanced the rotator cuff lateral-ward and also used an additional distal row of suture for additional stabilization.  I then took a 3 x 3 cm TissueMend graft and after preparing it placed it covering the defect and sutured around the perimeter with interrupted 3-0 Vicryl.  The wound was then checked for residual impingement and there was good clearance of the subacromial space.  I closed the fascia over the remainder of the acromion and small separation in the deltoid with interrupted #1 Vicryl, subcutaneous tissue with 2-0 Vicryl, staples in the skin. Betadine, Adaptic, and dry sterile dressing were applied.  He tolerated the procedure well, at the time of dictation and was taken to the recovery room in satisfactory condition with no known complications          ______________________________ Marlowe Kays, M.D.     JA/MEDQ  D:  06/09/2011  T:  06/10/2011  Job:  [id number]

## 2011-06-11 NOTE — Discharge Summary (Signed)
Gerald [name], Gerald [name]              ACCOUNT NO.:  [id number]  MEDICAL RECORD NO.:  [id number]  LOCATION:  1507                         [facility]ACILITY[facility]  PHYSICIAN:  Marlowe Kays, M.D.  DATE OF BIRTH:  03-06-1934  DATE OF ADMISSION:  06/09/2011 DATE OF DISCHARGE:  06/10/2011                              DISCHARGE SUMMARY   ADMISSION DIAGNOSIS:  Recurrent rotator cuff tear, right shoulder.  DISCHARGE DIAGNOSIS:  Recurrent rotator cuff tear, right shoulder.  OPERATION:  Anterior acromionectomy and repair of recurrent rotator cuff tear, right shoulder with TissueMend graft and rotator cuff anchor.  SUMMARY:  [name] was readmitted this time for repair of recurrent rotator cuff tear in his right shoulder.  In the past, he has had 2 prior repairs and recently he has had progressive pain in the right shoulder with an arthrogram demonstrating contrast material exiting through the rotator cuff into the subacromial space confirming a recurrent tear.  At surgery, I did find a 2.5 cm vertical tear at the level of the greater tuberosity and the prior surgery site.  I debrided out all the ragged tissue and previous nonabsorbable suture with repair as noted above.  He had an interscalene block prior to the anesthetic.  Postoperative course has been uncomplicated.  PLAN:  Plan is to discharge him today on Percocet and Robaxin as well as his regular home medicines he is instructed and use the arm.  Return to my office on June 14, 2011 for followup.          ______________________________ Marlowe Kays, M.D.     JA/MEDQ  D:  06/10/2011  T:  06/11/2011  Job:  [id number]

## 2011-07-27 DIAGNOSIS — Z1212 Encounter for screening for malignant neoplasm of rectum: Secondary | ICD-10-CM | POA: Diagnosis not present

## 2011-07-27 DIAGNOSIS — E109 Type 1 diabetes mellitus without complications: Secondary | ICD-10-CM | POA: Diagnosis not present

## 2011-07-27 DIAGNOSIS — Z111 Encounter for screening for respiratory tuberculosis: Secondary | ICD-10-CM | POA: Diagnosis not present

## 2011-07-27 DIAGNOSIS — Z125 Encounter for screening for malignant neoplasm of prostate: Secondary | ICD-10-CM | POA: Diagnosis not present

## 2011-07-27 DIAGNOSIS — Z23 Encounter for immunization: Secondary | ICD-10-CM | POA: Diagnosis not present

## 2011-07-27 DIAGNOSIS — E559 Vitamin D deficiency, unspecified: Secondary | ICD-10-CM | POA: Diagnosis not present

## 2011-07-27 DIAGNOSIS — Z79899 Other long term (current) drug therapy: Secondary | ICD-10-CM | POA: Diagnosis not present

## 2011-07-27 DIAGNOSIS — E782 Mixed hyperlipidemia: Secondary | ICD-10-CM | POA: Diagnosis not present

## 2011-07-27 DIAGNOSIS — I10 Essential (primary) hypertension: Secondary | ICD-10-CM | POA: Diagnosis not present

## 2011-09-13 DIAGNOSIS — M75120 Complete rotator cuff tear or rupture of unspecified shoulder, not specified as traumatic: Secondary | ICD-10-CM | POA: Diagnosis not present

## 2011-09-16 DIAGNOSIS — M75120 Complete rotator cuff tear or rupture of unspecified shoulder, not specified as traumatic: Secondary | ICD-10-CM | POA: Diagnosis not present

## 2011-09-21 DIAGNOSIS — M75120 Complete rotator cuff tear or rupture of unspecified shoulder, not specified as traumatic: Secondary | ICD-10-CM | POA: Diagnosis not present

## 2011-09-23 DIAGNOSIS — M75120 Complete rotator cuff tear or rupture of unspecified shoulder, not specified as traumatic: Secondary | ICD-10-CM | POA: Diagnosis not present

## 2011-09-28 DIAGNOSIS — M75120 Complete rotator cuff tear or rupture of unspecified shoulder, not specified as traumatic: Secondary | ICD-10-CM | POA: Diagnosis not present

## 2011-10-01 DIAGNOSIS — M75120 Complete rotator cuff tear or rupture of unspecified shoulder, not specified as traumatic: Secondary | ICD-10-CM | POA: Diagnosis not present

## 2011-10-05 DIAGNOSIS — M75120 Complete rotator cuff tear or rupture of unspecified shoulder, not specified as traumatic: Secondary | ICD-10-CM | POA: Diagnosis not present

## 2011-10-07 DIAGNOSIS — M75120 Complete rotator cuff tear or rupture of unspecified shoulder, not specified as traumatic: Secondary | ICD-10-CM | POA: Diagnosis not present

## 2011-11-01 DIAGNOSIS — E559 Vitamin D deficiency, unspecified: Secondary | ICD-10-CM | POA: Diagnosis not present

## 2011-11-01 DIAGNOSIS — I10 Essential (primary) hypertension: Secondary | ICD-10-CM | POA: Diagnosis not present

## 2011-11-01 DIAGNOSIS — E782 Mixed hyperlipidemia: Secondary | ICD-10-CM | POA: Diagnosis not present

## 2011-11-01 DIAGNOSIS — E109 Type 1 diabetes mellitus without complications: Secondary | ICD-10-CM | POA: Diagnosis not present

## 2011-11-01 DIAGNOSIS — Z79899 Other long term (current) drug therapy: Secondary | ICD-10-CM | POA: Diagnosis not present

## 2011-11-24 DIAGNOSIS — Z23 Encounter for immunization: Secondary | ICD-10-CM | POA: Diagnosis not present

## 2012-01-11 ENCOUNTER — Encounter: Payer: Self-pay | Admitting: Gastroenterology

## 2012-01-28 DIAGNOSIS — I10 Essential (primary) hypertension: Secondary | ICD-10-CM | POA: Diagnosis not present

## 2012-01-28 DIAGNOSIS — E119 Type 2 diabetes mellitus without complications: Secondary | ICD-10-CM | POA: Diagnosis not present

## 2012-01-28 DIAGNOSIS — E559 Vitamin D deficiency, unspecified: Secondary | ICD-10-CM | POA: Diagnosis not present

## 2012-01-28 DIAGNOSIS — Z79899 Other long term (current) drug therapy: Secondary | ICD-10-CM | POA: Diagnosis not present

## 2012-01-28 DIAGNOSIS — E109 Type 1 diabetes mellitus without complications: Secondary | ICD-10-CM | POA: Diagnosis not present

## 2012-01-28 DIAGNOSIS — E782 Mixed hyperlipidemia: Secondary | ICD-10-CM | POA: Diagnosis not present

## 2012-01-31 ENCOUNTER — Other Ambulatory Visit: Payer: Self-pay | Admitting: Internal Medicine

## 2012-01-31 DIAGNOSIS — E1139 Type 2 diabetes mellitus with other diabetic ophthalmic complication: Secondary | ICD-10-CM | POA: Diagnosis not present

## 2012-01-31 DIAGNOSIS — E041 Nontoxic single thyroid nodule: Secondary | ICD-10-CM

## 2012-02-02 ENCOUNTER — Ambulatory Visit
Admission: RE | Admit: 2012-02-02 | Discharge: 2012-02-02 | Disposition: A | Discharge status: Home / Self Care | Payer: Medicare Other | Source: Ambulatory Visit | Attending: Internal Medicine | Admitting: Internal Medicine

## 2012-02-02 DIAGNOSIS — E041 Nontoxic single thyroid nodule: Secondary | ICD-10-CM | POA: Diagnosis not present

## 2012-05-05 DIAGNOSIS — Z79899 Other long term (current) drug therapy: Secondary | ICD-10-CM | POA: Diagnosis not present

## 2012-05-05 DIAGNOSIS — E109 Type 1 diabetes mellitus without complications: Secondary | ICD-10-CM | POA: Diagnosis not present

## 2012-05-05 DIAGNOSIS — E559 Vitamin D deficiency, unspecified: Secondary | ICD-10-CM | POA: Diagnosis not present

## 2012-05-05 DIAGNOSIS — I10 Essential (primary) hypertension: Secondary | ICD-10-CM | POA: Diagnosis not present

## 2012-05-05 DIAGNOSIS — E782 Mixed hyperlipidemia: Secondary | ICD-10-CM | POA: Diagnosis not present

## 2012-05-29 DIAGNOSIS — E1139 Type 2 diabetes mellitus with other diabetic ophthalmic complication: Secondary | ICD-10-CM | POA: Diagnosis not present

## 2012-07-31 DIAGNOSIS — Z125 Encounter for screening for malignant neoplasm of prostate: Secondary | ICD-10-CM | POA: Diagnosis not present

## 2012-07-31 DIAGNOSIS — Z1212 Encounter for screening for malignant neoplasm of rectum: Secondary | ICD-10-CM | POA: Diagnosis not present

## 2012-07-31 DIAGNOSIS — E559 Vitamin D deficiency, unspecified: Secondary | ICD-10-CM | POA: Diagnosis not present

## 2012-07-31 DIAGNOSIS — N529 Male erectile dysfunction, unspecified: Secondary | ICD-10-CM | POA: Diagnosis not present

## 2012-07-31 DIAGNOSIS — Z79899 Other long term (current) drug therapy: Secondary | ICD-10-CM | POA: Diagnosis not present

## 2012-07-31 DIAGNOSIS — E119 Type 2 diabetes mellitus without complications: Secondary | ICD-10-CM | POA: Diagnosis not present

## 2012-07-31 DIAGNOSIS — I10 Essential (primary) hypertension: Secondary | ICD-10-CM | POA: Diagnosis not present

## 2012-07-31 DIAGNOSIS — E782 Mixed hyperlipidemia: Secondary | ICD-10-CM | POA: Diagnosis not present

## 2012-09-06 DIAGNOSIS — M542 Cervicalgia: Secondary | ICD-10-CM | POA: Diagnosis not present

## 2012-09-06 DIAGNOSIS — M9981 Other biomechanical lesions of cervical region: Secondary | ICD-10-CM | POA: Diagnosis not present

## 2012-09-08 DIAGNOSIS — M542 Cervicalgia: Secondary | ICD-10-CM | POA: Diagnosis not present

## 2012-09-08 DIAGNOSIS — M9981 Other biomechanical lesions of cervical region: Secondary | ICD-10-CM | POA: Diagnosis not present

## 2012-09-19 ENCOUNTER — Encounter: Payer: Self-pay | Admitting: Gastroenterology

## 2012-10-17 DIAGNOSIS — M999 Biomechanical lesion, unspecified: Secondary | ICD-10-CM | POA: Diagnosis not present

## 2012-10-17 DIAGNOSIS — M545 Low back pain: Secondary | ICD-10-CM | POA: Diagnosis not present

## 2012-10-17 DIAGNOSIS — M542 Cervicalgia: Secondary | ICD-10-CM | POA: Diagnosis not present

## 2012-10-17 DIAGNOSIS — M9981 Other biomechanical lesions of cervical region: Secondary | ICD-10-CM | POA: Diagnosis not present

## 2012-10-18 DIAGNOSIS — M999 Biomechanical lesion, unspecified: Secondary | ICD-10-CM | POA: Diagnosis not present

## 2012-10-18 DIAGNOSIS — M9981 Other biomechanical lesions of cervical region: Secondary | ICD-10-CM | POA: Diagnosis not present

## 2012-10-18 DIAGNOSIS — M542 Cervicalgia: Secondary | ICD-10-CM | POA: Diagnosis not present

## 2012-10-18 DIAGNOSIS — M545 Low back pain: Secondary | ICD-10-CM | POA: Diagnosis not present

## 2012-10-20 DIAGNOSIS — M999 Biomechanical lesion, unspecified: Secondary | ICD-10-CM | POA: Diagnosis not present

## 2012-10-20 DIAGNOSIS — M542 Cervicalgia: Secondary | ICD-10-CM | POA: Diagnosis not present

## 2012-10-20 DIAGNOSIS — M545 Low back pain: Secondary | ICD-10-CM | POA: Diagnosis not present

## 2012-10-20 DIAGNOSIS — M9981 Other biomechanical lesions of cervical region: Secondary | ICD-10-CM | POA: Diagnosis not present

## 2012-10-24 DIAGNOSIS — M545 Low back pain: Secondary | ICD-10-CM | POA: Diagnosis not present

## 2012-10-24 DIAGNOSIS — M999 Biomechanical lesion, unspecified: Secondary | ICD-10-CM | POA: Diagnosis not present

## 2012-10-24 DIAGNOSIS — M9981 Other biomechanical lesions of cervical region: Secondary | ICD-10-CM | POA: Diagnosis not present

## 2012-10-24 DIAGNOSIS — M542 Cervicalgia: Secondary | ICD-10-CM | POA: Diagnosis not present

## 2012-10-27 DIAGNOSIS — M545 Low back pain: Secondary | ICD-10-CM | POA: Diagnosis not present

## 2012-10-27 DIAGNOSIS — M999 Biomechanical lesion, unspecified: Secondary | ICD-10-CM | POA: Diagnosis not present

## 2012-10-27 DIAGNOSIS — M9981 Other biomechanical lesions of cervical region: Secondary | ICD-10-CM | POA: Diagnosis not present

## 2012-10-27 DIAGNOSIS — M542 Cervicalgia: Secondary | ICD-10-CM | POA: Diagnosis not present

## 2012-11-10 DIAGNOSIS — I10 Essential (primary) hypertension: Secondary | ICD-10-CM | POA: Diagnosis not present

## 2012-11-10 DIAGNOSIS — E109 Type 1 diabetes mellitus without complications: Secondary | ICD-10-CM | POA: Diagnosis not present

## 2012-11-10 DIAGNOSIS — E559 Vitamin D deficiency, unspecified: Secondary | ICD-10-CM | POA: Diagnosis not present

## 2012-11-10 DIAGNOSIS — Z79899 Other long term (current) drug therapy: Secondary | ICD-10-CM | POA: Diagnosis not present

## 2012-11-10 DIAGNOSIS — E782 Mixed hyperlipidemia: Secondary | ICD-10-CM | POA: Diagnosis not present

## 2012-11-27 DIAGNOSIS — M9981 Other biomechanical lesions of cervical region: Secondary | ICD-10-CM | POA: Diagnosis not present

## 2012-11-27 DIAGNOSIS — M999 Biomechanical lesion, unspecified: Secondary | ICD-10-CM | POA: Diagnosis not present

## 2012-11-27 DIAGNOSIS — M542 Cervicalgia: Secondary | ICD-10-CM | POA: Diagnosis not present

## 2012-11-27 DIAGNOSIS — E119 Type 2 diabetes mellitus without complications: Secondary | ICD-10-CM | POA: Diagnosis not present

## 2012-11-27 DIAGNOSIS — H3530 Unspecified macular degeneration: Secondary | ICD-10-CM | POA: Diagnosis not present

## 2012-11-27 DIAGNOSIS — M545 Low back pain: Secondary | ICD-10-CM | POA: Diagnosis not present

## 2013-01-22 ENCOUNTER — Other Ambulatory Visit: Payer: Self-pay | Admitting: Physician Assistant

## 2013-01-22 MED ORDER — LEVOTHYROXINE SODIUM 150 MCG PO TABS: 150.0000 ug | ORAL_TABLET | Freq: Every morning | ORAL | Status: DC

## 2013-01-22 MED ORDER — INSULIN PEN NEEDLE 31G X 5 MM MISC: Status: DC

## 2013-01-30 ENCOUNTER — Encounter: Payer: Self-pay | Admitting: Internal Medicine

## 2013-02-05 ENCOUNTER — Encounter: Payer: Self-pay | Admitting: Internal Medicine

## 2013-02-05 ENCOUNTER — Ambulatory Visit (INDEPENDENT_AMBULATORY_CARE_PROVIDER_SITE_OTHER): Payer: Medicare Other | Admitting: Internal Medicine

## 2013-02-05 VITALS — BP 132/68 | HR 64 | Temp 98.1°F | Resp 16 | Wt 167.2 lb

## 2013-02-05 DIAGNOSIS — E559 Vitamin D deficiency, unspecified: Secondary | ICD-10-CM | POA: Diagnosis not present

## 2013-02-05 DIAGNOSIS — E119 Type 2 diabetes mellitus without complications: Secondary | ICD-10-CM

## 2013-02-05 DIAGNOSIS — E782 Mixed hyperlipidemia: Secondary | ICD-10-CM | POA: Diagnosis not present

## 2013-02-05 DIAGNOSIS — I10 Essential (primary) hypertension: Secondary | ICD-10-CM | POA: Diagnosis not present

## 2013-02-05 DIAGNOSIS — Z79899 Other long term (current) drug therapy: Secondary | ICD-10-CM

## 2013-02-05 DIAGNOSIS — E1122 Type 2 diabetes mellitus with diabetic chronic kidney disease: Secondary | ICD-10-CM | POA: Insufficient documentation

## 2013-02-05 LAB — CBC WITH DIFFERENTIAL/PLATELET
Eosinophils Absolute: 0.2 10*3/uL (ref 0.0–0.7)
Lymphocytes Relative: 29 % (ref 12–46)
Lymphs Abs: 1.3 10*3/uL (ref 0.7–4.0)
MCH: 30.8 pg (ref 26.0–34.0)
Neutrophils Relative %: 58 % (ref 43–77)
Platelets: 149 10*3/uL — ABNORMAL LOW (ref 150–400)
RBC: 4.38 MIL/uL (ref 4.22–5.81)
WBC: 4.5 10*3/uL (ref 4.0–10.5)

## 2013-02-05 LAB — LIPID PANEL
Cholesterol: 124 mg/dL (ref 0–200)
HDL: 39 mg/dL — ABNORMAL LOW (ref >39)
Total CHOL/HDL Ratio: 3.2 Ratio
Triglycerides: 139 mg/dL (ref <150)

## 2013-02-05 LAB — MAGNESIUM: Magnesium: 1.6 mg/dL (ref 1.5–2.5)

## 2013-02-05 LAB — BASIC METABOLIC PANEL WITH GFR
CO2: 27 mEq/L (ref 19–32)
Calcium: 9.2 mg/dL (ref 8.4–10.5)
GFR, Est Non African American: 65 mL/min
Sodium: 140 mEq/L (ref 135–145)

## 2013-02-05 LAB — TSH: TSH: 2.252 u[IU]/mL (ref 0.350–4.500)

## 2013-02-05 LAB — HEPATIC FUNCTION PANEL
AST: 23 U/L (ref 0–37)
Bilirubin, Direct: 0.1 mg/dL (ref 0.0–0.3)
Total Bilirubin: 0.5 mg/dL (ref 0.3–1.2)

## 2013-02-05 NOTE — Patient Instructions (Signed)

## 2013-02-05 NOTE — Progress Notes (Signed)
Patient ID: Gerald [name], male   DOB: 15-Mar-1934, 78 y.o.   MRN: [id number]   This very nice 78 yo MWM presents for 3 month follow up with Hypertension, Hyperlipidemia, T1 Diabetes and Vitamin D Deficiency.    HTN predates from 1989BP has been controlled at home. Today's BP is 132/68. Patient denies any cardiac type chest pain, palpitations, dyspnea/orthopnea/PND, dizziness, claudication, or dependent edema.   Hyperlipidemia is controlled with diet & meds. Last Cholesterol was  125, Triglycerides were 105, HDL 40 and LDL 64. Patient denies myalgias or other med SE's.    Also, the patient has history of T1 Diabetes predating from 76 with last A1c of 6.7 % in September.  FBG's  usu range 100-130 mg % . Patient denies any symptoms of reactive hypoglycemia, diabetic polys, paresthesias or visual blurring.   Further, Patient has history of Vitamin D Deficiency with last vitamin D of 90 in September. Patient supplements vitamin D without any suspected side-effects.  Medication Sig Dispense Refill  . celecoxib (CELEBREX) 200 MG capsule Take 200 mg by mouth every morning.      . cholecalciferol (VITAMIN D) 1000 UNITS tablet Take 1,000 Units by mouth daily.      Marland Kitchen esomeprazole (NEXIUM) 40 MG capsule Take 40 mg by mouth every evening.       . ezetimibe (ZETIA) 10 MG tablet Take 10 mg by mouth every morning.       . finasteride (PROSCAR) 5 MG tablet Take 3.75 mg by mouth daily. Verified with patient. Pt states that he take 2/3 of the tablet and that the doctor helps him cut it      . fish oil-omega-3 fatty acids 1000 MG capsule Take 2 g by mouth daily.      . insulin glargine (LANTUS) 100 UNIT/ML injection Inject 40 Units into the skin every morning.      . Insulin Pen Needle 31G X 5 MM MISC Check blood sugar 3 times daily for fluctuating blood sugars  100 each  5  . labetalol (NORMODYNE) 100 MG tablet Take 100 mg by mouth 2 (two) times daily. 100MG  IN AM / 200MG  PM      . levothyroxine (SYNTHROID,  LEVOTHROID) 150 MCG tablet Take 1 tablet (150 mcg total) by mouth every morning.  90 tablet  0  . losartan-hydrochlorothiazide (HYZAAR) 100-25 MG per tablet Take 1 tablet by mouth every morning.      . metFORMIN (GLUCOPHAGE) 500 MG tablet Take 2,000 mg by mouth every evening.       . Multiple Vitamin (MULTIVITAMIN) tablet Take 1 tablet by mouth daily.      . simvastatin (ZOCOR) 40 MG tablet Take 40 mg by mouth every other day.           Allergies  Allergen Reactions  . Ace Inhibitors   . Beta Adrenergic Blockers   . Nsaids     PMHx:   Past Medical History  Diagnosis Date  . Hypertension   . Hyperlipidemia   . Arthritis of shoulder BILATERAL  . GERD (gastroesophageal reflux disease)   . BPH (benign prostatic hypertrophy)   . Hyperthyroidism   . Rotator cuff tear, right RECURRENT  . Diabetes mellitus ORAL MED AND INSULIN  . Vitamin D deficiency     FHx:    Reviewed / unchanged  SHx:    Reviewed / unchanged  Systems Review: Constitutional: Denies fever, chills, wt changes, headaches, insomnia, fatigue, night sweats, change in appetite. Eyes: Denies redness, blurred  vision, diplopia, discharge, itchy, watery eyes.  ENT: Denies discharge, congestion, post nasal drip, epistaxis, sore throat, earache, hearing loss, dental pain, tinnitus, vertigo, sinus pain, snoring.  CV: Denies chest pain, palpitations, irregular heartbeat, syncope, dyspnea, diaphoresis, orthopnea, PND, claudication, edema. Respiratory: denies cough, dyspnea, DOE, pleurisy, hoarseness, laryngitis, wheezing.  Gastrointestinal: Denies dysphagia, odynophagia, heartburn, reflux, water brash, abdominal pain or cramps, nausea, vomiting, bloating, diarrhea, constipation, hematemesis, melena, hematochezia,  or hemorrhoids. Genitourinary: Denies dysuria, frequency, urgency, nocturia, hesitancy, discharge, hematuria, flank pain. Musculoskeletal: Denies  myalgias, stiffness, jt. swelling, pain, limp, strain/sprain.  Today c/o  arthralgias dorsum Left foot and advised to see his orthopedist. Skin: Denies pruritus, rash, hives, warts, acne, eczema, change in skin lesion(s). Neuro: No weakness, tremor, incoordination, spasms, paresthesia, or pain. Psychiatric: Denies confusion, memory loss, or sensory loss. Endo: Denies change in weight, skin, hair change.  Heme/Lymph: No excessive bleeding, bruising, orenlarged lymph nodes.  Filed Vitals:   02/05/13 0857  BP: 132/68  Pulse: 64  Temp: 98.1 F (36.7 C)  Resp: 16    Estimated body mass index is 23.99 kg/(m^2) as calculated from the following:   Height as of 06/09/11: 5\' 10"  (1.778 m).   Weight as of this encounter: 167 lb 3.2 oz (75.841 kg).  On Exam: Appears well nourished - in no distress. Eyes: PERRLA, EOMs, conjunctiva no swelling or erythema. Sinuses: No frontal/maxillary tenderness ENT/Mouth: EAC's clear, TM's nl w/o erythema, bulging. Nares clear w/o erythema, swelling, exudates. Oropharynx clear without erythema or exudates. Oral hygiene is good. Tongue normal, non obstructing. Hearing intact.  Neck: Supple. Thyroid nl. Car 2+/2+ without bruits, nodes or JVD. Chest: Respirations nl with BS clear & equal w/o rales, rhonchi, wheezing or stridor.  Cor: Heart sounds normal w/ regular rate and rhythm without sig. murmurs, gallops, clicks, or rubs. Peripheral pulses normal and equal  without edema.  Abdomen: Soft & bowel sounds normal. Non-tender w/o guarding, rebound, hernias, masses, or organomegaly.  Lymphatics: Unremarkable.  Musculoskeletal: Full ROM all peripheral extremities, joint stability, 5/5 strength, and normal gait.  Skin: Warm, dry without exposed rashes, lesions, ecchymosis apparent.  Neuro: Cranial nerves intact, reflexes equal bilaterally. Sensory-motor testing grossly intact. Tendon reflexes grossly intact.  Pysch: Alert & oriented x 3. Insight and judgement nl & appropriate. No ideations.  Assessment and Plan:  1. Hypertension -  Continue monitor blood pressure at home. Continue diet/meds same.  2. Hyperlipidemia - Continue diet/meds, exercise,& lifestyle modifications. Continue monitor periodic cholesterol/liver & renal functions   3.T1 Diabetes - continue recommend prudent low glycemic diet, weight control, regular exercise, diabetic monitoring and periodic eye exams.  4. Vitamin D Deficiency - Continue supplementation.  Recommended regular exercise, BP monitoring, weight control, and discussed med and SE's. Recommended labs to assess and monitor clinical status. Further disposition pending results of labs.

## 2013-02-06 LAB — VITAMIN D 25 HYDROXY (VIT D DEFICIENCY, FRACTURES): Vit D, 25-Hydroxy: 89 ng/mL (ref 30–89)

## 2013-02-16 ENCOUNTER — Other Ambulatory Visit: Payer: Self-pay | Admitting: Internal Medicine

## 2013-02-19 ENCOUNTER — Other Ambulatory Visit: Payer: Self-pay | Admitting: Internal Medicine

## 2013-03-08 DIAGNOSIS — M19079 Primary osteoarthritis, unspecified ankle and foot: Secondary | ICD-10-CM | POA: Diagnosis not present

## 2013-03-12 ENCOUNTER — Other Ambulatory Visit: Payer: Self-pay | Admitting: Physician Assistant

## 2013-03-30 DIAGNOSIS — M6530 Trigger finger, unspecified finger: Secondary | ICD-10-CM | POA: Diagnosis not present

## 2013-03-31 ENCOUNTER — Other Ambulatory Visit: Payer: Self-pay | Admitting: Internal Medicine

## 2013-04-02 ENCOUNTER — Other Ambulatory Visit: Payer: Self-pay | Admitting: Emergency Medicine

## 2013-04-20 ENCOUNTER — Other Ambulatory Visit: Payer: Self-pay | Admitting: Internal Medicine

## 2013-04-20 ENCOUNTER — Other Ambulatory Visit: Payer: Self-pay | Admitting: Physician Assistant

## 2013-04-24 DIAGNOSIS — M19079 Primary osteoarthritis, unspecified ankle and foot: Secondary | ICD-10-CM | POA: Diagnosis not present

## 2013-04-24 DIAGNOSIS — M2040 Other hammer toe(s) (acquired), unspecified foot: Secondary | ICD-10-CM | POA: Diagnosis not present

## 2013-05-11 ENCOUNTER — Other Ambulatory Visit: Payer: Self-pay | Admitting: Internal Medicine

## 2013-05-11 ENCOUNTER — Encounter: Payer: Self-pay | Admitting: Physician Assistant

## 2013-05-11 ENCOUNTER — Ambulatory Visit (INDEPENDENT_AMBULATORY_CARE_PROVIDER_SITE_OTHER): Payer: Medicare Other | Admitting: Physician Assistant

## 2013-05-11 VITALS — BP 138/78 | HR 56 | Temp 97.7°F | Resp 16 | Wt 166.0 lb

## 2013-05-11 DIAGNOSIS — I10 Essential (primary) hypertension: Secondary | ICD-10-CM | POA: Diagnosis not present

## 2013-05-11 DIAGNOSIS — Z79899 Other long term (current) drug therapy: Secondary | ICD-10-CM | POA: Diagnosis not present

## 2013-05-11 DIAGNOSIS — E559 Vitamin D deficiency, unspecified: Secondary | ICD-10-CM

## 2013-05-11 DIAGNOSIS — E782 Mixed hyperlipidemia: Secondary | ICD-10-CM

## 2013-05-11 DIAGNOSIS — F329 Major depressive disorder, single episode, unspecified: Secondary | ICD-10-CM

## 2013-05-11 DIAGNOSIS — Z1331 Encounter for screening for depression: Secondary | ICD-10-CM

## 2013-05-11 DIAGNOSIS — E119 Type 2 diabetes mellitus without complications: Secondary | ICD-10-CM | POA: Diagnosis not present

## 2013-05-11 DIAGNOSIS — F3289 Other specified depressive episodes: Secondary | ICD-10-CM | POA: Diagnosis not present

## 2013-05-11 DIAGNOSIS — Z0000 Encounter for general adult medical examination without abnormal findings: Secondary | ICD-10-CM

## 2013-05-11 LAB — TSH: TSH: 1.501 u[IU]/mL (ref 0.350–4.500)

## 2013-05-11 LAB — LIPID PANEL
Cholesterol: 130 mg/dL (ref 0–200)
HDL: 44 mg/dL (ref >39)
LDL CALC: 60 mg/dL (ref 0–99)
Total CHOL/HDL Ratio: 3 Ratio
Triglycerides: 129 mg/dL (ref <150)
VLDL: 26 mg/dL (ref 0–40)

## 2013-05-11 LAB — CBC WITH DIFFERENTIAL/PLATELET
BASOS ABS: 0 10*3/uL (ref 0.0–0.1)
BASOS PCT: 1 % (ref 0–1)
EOS ABS: 0.1 10*3/uL (ref 0.0–0.7)
EOS PCT: 3 % (ref 0–5)
HCT: 40 % (ref 39.0–52.0)
Hemoglobin: 13.8 g/dL (ref 13.0–17.0)
LYMPHS ABS: 1.4 10*3/uL (ref 0.7–4.0)
Lymphocytes Relative: 30 % (ref 12–46)
MCH: 30.7 pg (ref 26.0–34.0)
MCHC: 34.5 g/dL (ref 30.0–36.0)
MCV: 88.9 fL (ref 78.0–100.0)
Monocytes Absolute: 0.4 10*3/uL (ref 0.1–1.0)
Monocytes Relative: 9 % (ref 3–12)
Neutro Abs: 2.7 10*3/uL (ref 1.7–7.7)
Neutrophils Relative %: 57 % (ref 43–77)
PLATELETS: 147 10*3/uL — AB (ref 150–400)
RBC: 4.5 MIL/uL (ref 4.22–5.81)
RDW: 14 % (ref 11.5–15.5)
WBC: 4.7 10*3/uL (ref 4.0–10.5)

## 2013-05-11 LAB — BASIC METABOLIC PANEL WITH GFR
BUN: 22 mg/dL (ref 6–23)
CALCIUM: 9.4 mg/dL (ref 8.4–10.5)
CO2: 26 meq/L (ref 19–32)
CREATININE: 1.13 mg/dL (ref 0.50–1.35)
Chloride: 105 mEq/L (ref 96–112)
GFR, EST AFRICAN AMERICAN: 73 mL/min
GFR, Est Non African American: 63 mL/min
Glucose, Bld: 150 mg/dL — ABNORMAL HIGH (ref 70–99)
Potassium: 4.8 mEq/L (ref 3.5–5.3)
Sodium: 140 mEq/L (ref 135–145)

## 2013-05-11 LAB — HEPATIC FUNCTION PANEL
ALBUMIN: 4.5 g/dL (ref 3.5–5.2)
ALT: 27 U/L (ref 0–53)
AST: 29 U/L (ref 0–37)
Alkaline Phosphatase: 46 U/L (ref 39–117)
BILIRUBIN TOTAL: 0.5 mg/dL (ref 0.2–1.2)
Bilirubin, Direct: 0.1 mg/dL (ref 0.0–0.3)
Indirect Bilirubin: 0.4 mg/dL (ref 0.2–1.2)
Total Protein: 6.3 g/dL (ref 6.0–8.3)

## 2013-05-11 LAB — HEMOGLOBIN A1C
HEMOGLOBIN A1C: 7.1 % — AB (ref <5.7)
MEAN PLASMA GLUCOSE: 157 mg/dL — AB (ref <117)

## 2013-05-11 LAB — MAGNESIUM: Magnesium: 1.7 mg/dL (ref 1.5–2.5)

## 2013-05-11 NOTE — Patient Instructions (Signed)

## 2013-05-11 NOTE — Progress Notes (Signed)
Subjective:  Gerald [name] is a 79 y.o. male who presents for Medicare Annual Wellness Visit and 3 month follow up for HTN, hyperlipidemia, diabetes, and vitamin D Def.  Date of last medicare wellness visit was is unknown.  His blood pressure has been controlled at home, today their BP is BP: 138/78 mmHg He does not workout right now due to left foot pain, otherwise he normally walks. He denies chest pain, shortness of breath, dizziness.  He is on cholesterol medication and denies myalgias. His cholesterol is at goal. The cholesterol last visit was:   Lab Results  Component Value Date   CHOL 124 02/05/2013   HDL 39* 02/05/2013   LDLCALC 57 02/05/2013   TRIG 139 02/05/2013   CHOLHDL 3.2 02/05/2013   He has been working on diet and exercise for diabetes, and denies foot ulcerations, paresthesia of the feet, polydipsia, polyuria and visual disturbances. Last A1C in the office was:  Lab Results  Component Value Date   HGBA1C 6.9* 02/05/2013   Patient is on Vitamin D supplement.   He is seeing a orthopedic for a left hammer toe that he may need to have surgery.    Names of Other Physician/Practitioners you currently use: 1. [facility] here for primary care 2. Dr. Emily Filbert, eye doctor, last visit 11/27/2012, no retinopathy, early macular- see NOTE 3. Dr. Herb Grays, dentist, last visit 05/05/2013, abscess tooth 4. Dr. Ezzard Standing 5. Dr. Arlyce Dice Patient Care Team: Lucky Cowboy, MD as PCP - General (Internal Medicine)  Medication Review: Current Outpatient Prescriptions on File Prior to Visit  Medication Sig Dispense Refill  . celecoxib (CELEBREX) 200 MG capsule TAKE 1 CAPSULE BY MOUTH EVERY DAY FOR PAIN AND INFLAMMATION  90 capsule  0  . cholecalciferol (VITAMIN D) 1000 UNITS tablet Take 1,000 Units by mouth daily.      Marland Kitchen esomeprazole (NEXIUM) 40 MG capsule Take 40 mg by mouth every evening.       . finasteride (PROSCAR) 5 MG tablet TAKE ONE TABLET  BY MOUTH ONE TIME DAILY  30 tablet  3  . fish oil-omega-3 fatty acids 1000 MG capsule Take 2 g by mouth daily.      . Insulin Pen Needle 31G X 5 MM MISC Check blood sugar 3 times daily for fluctuating blood sugars  100 each  5  . labetalol (NORMODYNE) 100 MG tablet Take 100 mg by mouth 2 (two) times daily. 100MG  IN AM / 200MG  PM      . LANTUS SOLOSTAR 100 UNIT/ML SOPN USE AS DIRECTED  15 mL  3  . levothyroxine (SYNTHROID, LEVOTHROID) 150 MCG tablet TAKE 1 TABLET BY MOUTH EVERY MORNING  90 tablet  0  . losartan-hydrochlorothiazide (HYZAAR) 100-25 MG per tablet Take 1 tablet by mouth every morning.      . metFORMIN (GLUCOPHAGE) 500 MG tablet Take 2,000 mg by mouth every evening.       . minocycline (MINOCIN,DYNACIN) 100 MG capsule TAKE 1 CAPSULE BY MOUTH EVERY DAY  90 capsule  0  . Multiple Vitamin (MULTIVITAMIN) tablet Take 1 tablet by mouth daily.      . simvastatin (ZOCOR) 40 MG tablet Take 40 mg by mouth every other day.       Marland Kitchen ZETIA 10 MG tablet TAKE ONE TABLET BY MOUTH ONE TIME DAILY  30 tablet  2   No current facility-administered medications on file prior to visit.    Current Problems (verified) Patient Active Problem List  Diagnosis Date Noted  . Unspecified essential hypertension 02/05/2013  . Mixed hyperlipidemia 02/05/2013  . Type II or unspecified type diabetes mellitus without mention of complication, not stated as uncontrolled 02/05/2013  . Unspecified vitamin D deficiency 02/05/2013    Screening Tests Health Maintenance  Topic Date Due  . Colonoscopy  12/08/1986  . Influenza Vaccine  09/22/2013  . Tetanus/tdap  02/22/2014  . Pneumococcal Polysaccharide Vaccine Age 39 And Over  Completed  . Zostavax  Completed    Immunization History  Administered Date(s) Administered  . Influenza Whole 11/10/2012  . Pneumococcal Polysaccharide-23 07/27/2011  . Tdap 02/23/2004  . Zoster 07/15/2005    Preventative care: Last colonoscopy: 01/2012  Prior vaccinations: TD or  Tdap: 2006  Influenza: 2014  Pneumococcal: 2013 Shingles/Zostavax: 2007  History reviewed: allergies, current medications, past family history, past medical history, past social history, past surgical history and problem list   Risk Factors: Tobacco History  Substance Use Topics  . Smoking status: Former Smoker    Types: Cigarettes    Quit date: 06/03/1990  . Smokeless tobacco: Never Used  . Alcohol Use: Yes     Comment: OCCASIONAL , WINE   He does not smoke.  Patient is a former smoker. Are there smokers in your home (other than you)?  No  Alcohol Current alcohol use: social drinker  Caffeine Current caffeine use: coffee 1-2 /day  Exercise Current exercise habits: works out in the yard  Current exercise: gardening and yard work  Nutrition/Diet Current diet: in general, a "healthy" diet    Cardiac risk factors: advanced age (older than 43 for men, 30 for women), diabetes mellitus, dyslipidemia, hypertension, male gender and sedentary lifestyle.  Depression Screen Nurse depression screen reviewed.  (Note: if answer to either of the following is "Yes", a more complete depression screening is indicated)   Q1: Over the past two weeks, have you felt down, depressed or hopeless? No  Q2: Over the past two weeks, have you felt little interest or pleasure in doing things? Yes  Have you lost interest or pleasure in daily life? No  Do you often feel hopeless? No  Do you cry easily over simple problems? No  Activities of Daily Living Nurse ADLs screen reviewed.  In your present state of health, do you have any difficulty performing the following activities?:  Driving? No Managing money?  No Feeding yourself? No Getting from bed to chair? No Climbing a flight of stairs? No Preparing food and eating?: No Bathing or showering? No Getting dressed: No Getting to the toilet? No Using the toilet:No Moving around from place to place: No In the past year have you fallen or  had a near fall?:No   Are you sexually active?  No  Do you have more than one partner?  No  Vision Difficulties: Yes  Hearing Difficulties: No Do you often ask people to speak up or repeat themselves? Yes Do you experience ringing or noises in your ears? No Do you have difficulty understanding soft or whispered voices? No  Cognition  Do you feel that you have a problem with memory?Yes  Do you often misplace items? No  Do you feel safe at home?  Yes  Advanced directives Does patient have a Health Care Power of Attorney? Yes Does patient have a Living Will? Yes   Objective:     Vision and hearing screens reviewed.   Blood pressure 138/78, pulse 56, temperature 97.7 F (36.5 C), resp. rate 16, weight 166 lb (75.297  kg). Body mass index is 23.82 kg/(m^2).  General appearance: alert, no distress, WD/WN, male Cognitive Testing  Alert? Yes  Normal Appearance?Yes  Oriented to person? Yes  Place? Yes   Time? Yes  Recall of three objects?  Yes  Can perform simple calculations? Yes  Displays appropriate judgment?Yes  Can read the correct time from a watch face?Yes  HEENT: normocephalic, sclerae anicteric, TMs pearly, nares patent, no discharge or erythema, pharynx normal Oral cavity: MMM, no lesions Neck: supple, no lymphadenopathy, no thyromegaly, no masses Heart: RRR, normal S1, S2, no murmurs Lungs: CTA bilaterally, no wheezes, rhonchi, or rales Abdomen: +bs, soft, non tender, non distended, no masses, no hepatomegaly, no splenomegaly Musculoskeletal: nontender, no swelling, no obvious deformity Extremities: no edema, no cyanosis, no clubbing, His left 2nd toe does have some erythema at PIP, good pulses, sensation intact.  Pulses: 2+ symmetric, upper and lower extremities, normal cap refill Neurological: alert, oriented x 3, CN2-12 intact, strength normal upper extremities and lower extremities, sensation normal throughout, DTRs 2+ throughout, no cerebellar signs, gait  normal Psychiatric: normal affect, behavior normal, pleasant   Assessment:   1. Unspecified essential hypertension - CBC with Differential - BASIC METABOLIC PANEL WITH GFR - Hepatic function panel - TSH  2. Type II or unspecified type diabetes mellitus without mention of complication, not stated as uncontrolled Discussed general issues about diabetes pathophysiology and management., Educational material distributed., Suggested low cholesterol diet., Encouraged aerobic exercise., Discussed foot care., Reminded to get yearly retinal exam. - Hemoglobin A1c - Insulin, fasting - HM DIABETES FOOT EXAM  3. Mixed hyperlipidemia - Lipid panel  4. Unspecified vitamin D deficiency  5. Encounter for long-term (current) use of other medications - Magnesium   Plan:   During the course of the visit the patient was educated and counseled about appropriate screening and preventive services including:    Influenza vaccine  Td vaccine  Screening electrocardiogram  Bone densitometry screening  Colorectal cancer screening  Diabetes screening  Glaucoma screening  Nutrition counseling   Screening recommendations, referrals: Vaccinations: Tdap vaccine not done  Influenza vaccine not done Pneumococcal vaccine not done Shingles vaccine not done Hep B vaccine not done  Nutrition assessed and recommended  Colonoscopy no Recommended yearly ophthalmology/optometry visit for glaucoma screening and checkup Recommended yearly dental visit for hygiene and checkup Advanced directives - no  Conditions/risks identified: BMI: Discussed weight loss, diet, and increase physical activity.  Increase physical activity: AHA recommends 150 minutes of physical activity a week.  Medications reviewed Diabetes is not at goal, ACE/ARB therapy: Yes. Urinary Incontinence is not an issue: discussed non pharmacology and pharmacology options.  Fall risk: moderate- discussed PT, home fall assessment,  medications.    Medicare Attestation I have personally reviewed: The patient's medical and social history Their use of alcohol, tobacco or illicit drugs Their current medications and supplements The patient's functional ability including ADLs,fall risks, home safety risks, cognitive, and hearing and visual impairment Diet and physical activities Evidence for depression or mood disorders  The patient's weight, height, BMI, and visual acuity have been recorded in the chart.  I have made referrals, counseling, and provided education to the patient based on review of the above and I have provided the patient with a written personalized care plan for preventive services.     Quentin MullingCollier, Regnald Bowens, PA-C   05/11/2013

## 2013-05-12 LAB — INSULIN, FASTING: Insulin fasting, serum: 24 u[IU]/mL (ref 3–28)

## 2013-05-17 ENCOUNTER — Encounter: Payer: Self-pay | Admitting: Physician Assistant

## 2013-05-17 ENCOUNTER — Ambulatory Visit (INDEPENDENT_AMBULATORY_CARE_PROVIDER_SITE_OTHER): Payer: Medicare Other | Admitting: Physician Assistant

## 2013-05-17 VITALS — BP 128/60 | HR 56 | Temp 97.9°F | Resp 16 | Wt 166.0 lb

## 2013-05-17 DIAGNOSIS — J029 Acute pharyngitis, unspecified: Secondary | ICD-10-CM

## 2013-05-17 MED ORDER — PREDNISONE 20 MG PO TABS: ORAL_TABLET | ORAL | Status: DC

## 2013-05-17 MED ORDER — AZITHROMYCIN 250 MG PO TABS: 250.0000 mg | ORAL_TABLET | Freq: Every day | ORAL | Status: DC

## 2013-05-17 NOTE — Patient Instructions (Signed)
Please take the prednisone to help decrease inflammation and therefore decrease symptoms. Take it it with food to avoid GI upset. It can cause increased energy but on the other hand it can make it hard to sleep at night so please take it in the morning.  It is not an antibiotic so you can stop it early if you are feeling better.  If you are diabetic it will increase your sugars.   Pharyngitis Pharyngitis is redness, pain, and swelling (inflammation) of your pharynx.  CAUSES  Pharyngitis is usually caused by infection. Most of the time, these infections are from viruses (viral) and are part of a cold. However, sometimes pharyngitis is caused by bacteria (bacterial). Pharyngitis can also be caused by allergies. Viral pharyngitis may be spread from person to person by coughing, sneezing, and personal items or utensils (cups, forks, spoons, toothbrushes). Bacterial pharyngitis may be spread from person to person by more intimate contact, such as kissing.  SIGNS AND SYMPTOMS  Symptoms of pharyngitis include:   Sore throat.   Tiredness (fatigue).   Low-grade fever.   Headache.  Joint pain and muscle aches.  Skin rashes.  Swollen lymph nodes.  Plaque-like film on throat or tonsils (often seen with bacterial pharyngitis). DIAGNOSIS  Your health care provider will ask you questions about your illness and your symptoms. Your medical history, along with a physical exam, is often all that is needed to diagnose pharyngitis. Sometimes, a rapid strep test is done. Other lab tests may also be done, depending on the suspected cause.  TREATMENT  Viral pharyngitis will usually get better in 3 4 days without the use of medicine. Bacterial pharyngitis is treated with medicines that kill germs (antibiotics).  HOME CARE INSTRUCTIONS   Drink enough water and fluids to keep your urine clear or pale yellow.   Only take over-the-counter or prescription medicines as directed by your health care provider:    If you are prescribed antibiotics, make sure you finish them even if you start to feel better.   Do not take aspirin.   Get lots of rest.   Gargle with 8 oz of salt water ( tsp of salt per 1 qt of water) as often as every 1 2 hours to soothe your throat.   Throat lozenges (if you are not at risk for choking) or sprays may be used to soothe your throat. SEEK MEDICAL CARE IF:   You have large, tender lumps in your neck.  You have a rash.  You cough up green, yellow-brown, or bloody spit. SEEK IMMEDIATE MEDICAL CARE IF:   Your neck becomes stiff.  You drool or are unable to swallow liquids.  You vomit or are unable to keep medicines or liquids down.  You have severe pain that does not go away with the use of recommended medicines.  You have trouble breathing (not caused by a stuffy nose). MAKE SURE YOU:   Understand these instructions.  Will watch your condition.  Will get help right away if you are not doing well or get worse. Document Released: 02/08/2005 Document Revised: 11/29/2012 Document Reviewed: 10/16/2012 [facility] Patient Information 2014 [place], [place].

## 2013-05-17 NOTE — Progress Notes (Signed)
   Subjective:    Patient ID: Gerald [name], male    DOB: Jun 22, 1934, 79 y.o.   MRN: [id number]  Sore Throat  This is a new problem. Episode onset: Monday. The problem has been gradually worsening. The maximum temperature recorded prior to his arrival was 100 - 100.9 F. The fever has been present for 1 to 2 days. Associated symptoms include congestion, coughing, a hoarse voice and swollen glands. Pertinent negatives include no abdominal pain, diarrhea, drooling, ear discharge, ear pain, headaches, plugged ear sensation, neck pain, shortness of breath, stridor, trouble swallowing or vomiting. He has tried acetaminophen for the symptoms. The treatment provided no relief.    Review of Systems  Constitutional: Positive for fever and chills. Negative for fatigue.  HENT: Positive for congestion, hoarse voice, rhinorrhea and sore throat. Negative for drooling, ear discharge, ear pain, mouth sores, nosebleeds, postnasal drip, sinus pressure and trouble swallowing.   Eyes: Negative.   Respiratory: Positive for cough. Negative for chest tightness, shortness of breath and stridor.   Cardiovascular: Negative.   Gastrointestinal: Negative.  Negative for vomiting, abdominal pain and diarrhea.  Genitourinary: Negative.   Musculoskeletal: Negative for neck pain.  Neurological: Negative.  Negative for headaches.       Objective:   Physical Exam  Constitutional: He appears well-developed and well-nourished.  HENT:  Head: Normocephalic and atraumatic.  Right Ear: External ear normal.  Left Ear: External ear normal.  Mouth/Throat: Uvula is midline and mucous membranes are normal. Posterior oropharyngeal edema and posterior oropharyngeal erythema present.  Eyes: Conjunctivae and EOM are normal. Pupils are equal, round, and reactive to light.  Neck: Normal range of motion. Neck supple.  Cardiovascular: Normal rate, regular rhythm and normal heart sounds.   Pulmonary/Chest: Effort normal and breath  sounds normal.  Abdominal: Soft. Bowel sounds are normal.  Lymphadenopathy:    He has cervical adenopathy.  Skin: Skin is warm and dry.      Assessment & Plan:  Acute pharyngitis - Plan: azithromycin (ZITHROMAX) 250 MG tablet, predniSONE (DELTASONE) 20 MG tablet

## 2013-06-01 DIAGNOSIS — T1510XA Foreign body in conjunctival sac, unspecified eye, initial encounter: Secondary | ICD-10-CM | POA: Diagnosis not present

## 2013-06-01 DIAGNOSIS — H3530 Unspecified macular degeneration: Secondary | ICD-10-CM | POA: Diagnosis not present

## 2013-06-01 DIAGNOSIS — E119 Type 2 diabetes mellitus without complications: Secondary | ICD-10-CM | POA: Diagnosis not present

## 2013-06-07 ENCOUNTER — Other Ambulatory Visit: Payer: Self-pay | Admitting: Physician Assistant

## 2013-06-12 DIAGNOSIS — M9981 Other biomechanical lesions of cervical region: Secondary | ICD-10-CM | POA: Diagnosis not present

## 2013-06-12 DIAGNOSIS — M999 Biomechanical lesion, unspecified: Secondary | ICD-10-CM | POA: Diagnosis not present

## 2013-06-12 DIAGNOSIS — M546 Pain in thoracic spine: Secondary | ICD-10-CM | POA: Diagnosis not present

## 2013-06-12 DIAGNOSIS — M542 Cervicalgia: Secondary | ICD-10-CM | POA: Diagnosis not present

## 2013-06-28 DIAGNOSIS — M999 Biomechanical lesion, unspecified: Secondary | ICD-10-CM | POA: Diagnosis not present

## 2013-06-28 DIAGNOSIS — M9981 Other biomechanical lesions of cervical region: Secondary | ICD-10-CM | POA: Diagnosis not present

## 2013-06-28 DIAGNOSIS — M546 Pain in thoracic spine: Secondary | ICD-10-CM | POA: Diagnosis not present

## 2013-06-28 DIAGNOSIS — M542 Cervicalgia: Secondary | ICD-10-CM | POA: Diagnosis not present

## 2013-07-02 DIAGNOSIS — M999 Biomechanical lesion, unspecified: Secondary | ICD-10-CM | POA: Diagnosis not present

## 2013-07-02 DIAGNOSIS — M546 Pain in thoracic spine: Secondary | ICD-10-CM | POA: Diagnosis not present

## 2013-07-02 DIAGNOSIS — M9981 Other biomechanical lesions of cervical region: Secondary | ICD-10-CM | POA: Diagnosis not present

## 2013-07-02 DIAGNOSIS — M542 Cervicalgia: Secondary | ICD-10-CM | POA: Diagnosis not present

## 2013-07-11 ENCOUNTER — Other Ambulatory Visit: Payer: Self-pay | Admitting: Internal Medicine

## 2013-07-11 ENCOUNTER — Other Ambulatory Visit: Payer: Self-pay | Admitting: Emergency Medicine

## 2013-07-17 ENCOUNTER — Other Ambulatory Visit: Payer: Self-pay | Admitting: Internal Medicine

## 2013-07-18 DIAGNOSIS — M999 Biomechanical lesion, unspecified: Secondary | ICD-10-CM | POA: Diagnosis not present

## 2013-07-18 DIAGNOSIS — M545 Low back pain, unspecified: Secondary | ICD-10-CM | POA: Diagnosis not present

## 2013-07-23 DIAGNOSIS — M545 Low back pain, unspecified: Secondary | ICD-10-CM | POA: Diagnosis not present

## 2013-07-23 DIAGNOSIS — M999 Biomechanical lesion, unspecified: Secondary | ICD-10-CM | POA: Diagnosis not present

## 2013-08-08 ENCOUNTER — Other Ambulatory Visit: Payer: Self-pay | Admitting: Physician Assistant

## 2013-08-08 ENCOUNTER — Encounter: Payer: Self-pay | Admitting: Internal Medicine

## 2013-08-09 ENCOUNTER — Other Ambulatory Visit: Payer: Self-pay | Admitting: Emergency Medicine

## 2013-08-15 ENCOUNTER — Encounter: Payer: Self-pay | Admitting: Internal Medicine

## 2013-08-15 ENCOUNTER — Other Ambulatory Visit: Payer: Self-pay | Admitting: Internal Medicine

## 2013-08-15 ENCOUNTER — Ambulatory Visit (INDEPENDENT_AMBULATORY_CARE_PROVIDER_SITE_OTHER): Payer: Medicare Other | Admitting: Internal Medicine

## 2013-08-15 VITALS — BP 134/74 | HR 56 | Temp 97.9°F | Resp 16 | Ht 69.25 in | Wt 165.8 lb

## 2013-08-15 DIAGNOSIS — E1029 Type 1 diabetes mellitus with other diabetic kidney complication: Secondary | ICD-10-CM

## 2013-08-15 DIAGNOSIS — I10 Essential (primary) hypertension: Secondary | ICD-10-CM | POA: Diagnosis not present

## 2013-08-15 DIAGNOSIS — Z789 Other specified health status: Secondary | ICD-10-CM

## 2013-08-15 DIAGNOSIS — E039 Hypothyroidism, unspecified: Secondary | ICD-10-CM | POA: Insufficient documentation

## 2013-08-15 DIAGNOSIS — Z79899 Other long term (current) drug therapy: Secondary | ICD-10-CM | POA: Diagnosis not present

## 2013-08-15 DIAGNOSIS — Z1212 Encounter for screening for malignant neoplasm of rectum: Secondary | ICD-10-CM

## 2013-08-15 DIAGNOSIS — E782 Mixed hyperlipidemia: Secondary | ICD-10-CM

## 2013-08-15 DIAGNOSIS — Z1331 Encounter for screening for depression: Secondary | ICD-10-CM | POA: Diagnosis not present

## 2013-08-15 DIAGNOSIS — Z125 Encounter for screening for malignant neoplasm of prostate: Secondary | ICD-10-CM

## 2013-08-15 DIAGNOSIS — E559 Vitamin D deficiency, unspecified: Secondary | ICD-10-CM | POA: Diagnosis not present

## 2013-08-15 DIAGNOSIS — Z1211 Encounter for screening for malignant neoplasm of colon: Secondary | ICD-10-CM

## 2013-08-15 LAB — CBC WITH DIFFERENTIAL/PLATELET
BASOS PCT: 1 % (ref 0–1)
Basophils Absolute: 0.1 10*3/uL (ref 0.0–0.1)
Eosinophils Absolute: 0.2 10*3/uL (ref 0.0–0.7)
Eosinophils Relative: 4 % (ref 0–5)
HCT: 39.4 % (ref 39.0–52.0)
Hemoglobin: 13.6 g/dL (ref 13.0–17.0)
LYMPHS PCT: 30 % (ref 12–46)
Lymphs Abs: 1.6 10*3/uL (ref 0.7–4.0)
MCH: 30.8 pg (ref 26.0–34.0)
MCHC: 34.5 g/dL (ref 30.0–36.0)
MCV: 89.3 fL (ref 78.0–100.0)
Monocytes Absolute: 0.4 10*3/uL (ref 0.1–1.0)
Monocytes Relative: 7 % (ref 3–12)
NEUTROS ABS: 3.1 10*3/uL (ref 1.7–7.7)
Neutrophils Relative %: 58 % (ref 43–77)
Platelets: 141 10*3/uL — ABNORMAL LOW (ref 150–400)
RBC: 4.41 MIL/uL (ref 4.22–5.81)
RDW: 14.4 % (ref 11.5–15.5)
WBC: 5.3 10*3/uL (ref 4.0–10.5)

## 2013-08-15 LAB — HEMOGLOBIN A1C
Hgb A1c MFr Bld: 6.8 % — ABNORMAL HIGH (ref <5.7)
MEAN PLASMA GLUCOSE: 148 mg/dL — AB (ref <117)

## 2013-08-15 MED ORDER — VALACYCLOVIR HCL 500 MG PO TABS: 500.0000 mg | ORAL_TABLET | Freq: Two times a day (BID) | ORAL | Status: AC

## 2013-08-15 MED ORDER — VALACYCLOVIR HCL 500 MG PO TABS: 500.0000 mg | ORAL_TABLET | Freq: Two times a day (BID) | ORAL | Status: DC

## 2013-08-15 MED ORDER — BACLOFEN 10 MG PO TABS: ORAL_TABLET | ORAL | Status: DC

## 2013-08-15 NOTE — Progress Notes (Signed)
Patient ID: Gerald [name], male   DOB: 04-19-1934, 79 y.o.   MRN: [id number]  Annual  Comprehensive Examination  This very nice 79 y.o.MWM presents for complete physical.  Patient has been followed for HTN, Hypothyroidism, T1_IDDM w/ Stage 2 CKD, Hyperlipidemia, and Vitamin D Deficiency.   HTN predates since 1989. Patient's BP has been controlled at home.Today's BP: 134/74 mmHg. Patient denies any cardiac symptoms as chest pain, palpitations, shortness of breath, dizziness or ankle swelling.   Patient's hyperlipidemia is controlled with diet and medications. Patient denies myalgias or other medication SE's. Last lipids as below in Mar 2015 are at goal.   Lab Results  Component Value Date   CHOL 130 05/11/2013   HDL 44 05/11/2013   LDLCALC 60 05/11/2013   TRIG 129 05/11/2013   CHOLHDL 3.0 05/11/2013    Patient has T1_IDDM  since 1999 and also has Stage 2 CKD (GFR 63 ml/min) and last A1c was 7.1% in Mar 2015. Patient reports CBG's range between 60-130 mg% with out extreme highs & lows. Patient denies reactive hypoglycemic symptoms, visual blurring, diabetic polys or paresthesias.    Finally, patient has history of Vitamin D Deficiency and last vitamin D was 5389 in Dec 2014.   Medication Sig  . celecoxib  200 MG capsule TAKE 1 CAPSULE BY MOUTH EVERY DAY  . finasteride  5 MG tablet TAKE ONE TABLET BY MOUTH ONE TIME DAILY  . fish oil- 1000 MG capsule Take 2 g by mouth daily.  . Insulin Pen Needle 31G X 5 MM  Check blood sugar 3 times daily for fluctuating blood sugars  . Labetalol 100 MG ta Take 100 mg by mouth 2 (two) times daily. 100MG AM / 200MG  PM  . LANTUS SOLOSTAR 100 UNIT/ML  USE AS DIRECTED  . levothyroxine  150 MCG tablet TAKE 1 TABLET BY MOUTH EVERY MORNING  . losartan-hctz  100-25  Take 1 tablet by mouth every morning.  . metFORMIN XR)500 MG 24 hr TAKE 4 TABLETS BY MOUTH EVERY DAY FOR DIABETES  . minocycline  100 MG capsule TAKE 1 CAPSULE BY MOUTH EVERY DAY  . MULTIVITAMIN tab Take 1  tablet by mouth daily.  Marland Kitchen. NEXIUM 40 MG cap TAKE 1 CAPSULE BY MOUTH EVERY DAY  . simvastatin  40 MG tablet Take 40 mg by mouth every other day.   Marland Kitchen. ZETIA 10 MG tablet TAKE 1 TABLET BY MOUTH DAILY   Allergies  Allergen Reactions  . Ace Inhibitors   . Beta Adrenergic Blockers    Past Medical History  Diagnosis Date  . Hypertension   . Hyperlipidemia   . Arthritis of shoulder BILATERAL  . GERD (gastroesophageal reflux disease)   . BPH (benign prostatic hypertrophy)   . Hyperthyroidism   . Rotator cuff tear, right RECURRENT  . Diabetes mellitus ORAL MED AND INSULIN  . Vitamin D deficiency    Past Surgical History  Procedure Laterality Date  . Right shoulder arthroscopy/ labral & biceps tendon debridement/ distal clavicle decompression/ sad/ mini open rotator cuff repair  03-09-2010  . Shoulder arthroscopy  04-10-2004    LEFT  . Lumbar microdiscectomy  04-06-1999    L4 - 5  . Shoulder open rotator cuff repair  06/09/2011    Procedure: ROTATOR CUFF REPAIR SHOULDER OPEN;  Surgeon: Drucilla SchmidtJames P Aplington, MD;  [facility];  Service: Orthopedics;  Laterality: Right;  anterior acromionectomy repair right rotator cuff with tissue mend     Family History  Problem Relation  Age of Onset  . Heart disease Mother   . Heart disease Father   . Stroke Father   . Cancer Father   . Cancer Sister   . Diabetes Maternal Grandmother   . Hypertension Sister    History   Social History  . Marital Status: Married    Spouse Name: N/A    Number of Children: N/A  . Years of Education: N/A   Occupational History  . Not on file.   Social History Main Topics  . Smoking status: Former Smoker    Types: Cigarettes    Quit date: 06/03/1990  . Smokeless tobacco: Never Used  . Alcohol Use: Yes     Comment: OCCASIONAL , WINE  . Drug Use: No  . Sexual Activity: No    ROS Constitutional: Denies fever, chills, weight loss/gain, headaches, insomnia, fatigue, night sweats or  change in appetite. Eyes: Denies redness, blurred vision, diplopia, discharge, itchy or watery eyes.  ENT: Denies discharge, congestion, post nasal drip, epistaxis, sore throat, earache, hearing loss, dental pain, Tinnitus, Vertigo, Sinus pain or snoring.  Cardio: Denies chest pain, palpitations, irregular heartbeat, syncope, dyspnea, diaphoresis, orthopnea, PND, claudication or edema Respiratory: denies cough, dyspnea, DOE, pleurisy, hoarseness, laryngitis or wheezing.  Gastrointestinal: Denies dysphagia, heartburn, reflux, water brash, pain, cramps, nausea, vomiting, bloating, diarrhea, constipation, hematemesis, melena, hematochezia, jaundice or hemorrhoids Genitourinary: Denies dysuria, frequency, urgency, nocturia, hesitancy, discharge, hematuria or flank pain Musculoskeletal: Denies arthralgia, myalgia, stiffness, Jt. Swelling, pain, limp or strain/sprain. Skin: Denies puritis, rash, hives, warts, acne, eczema or change in skin lesion Neuro: No weakness, tremor, incoordination, spasms, paresthesia or pain Psychiatric: Denies confusion, memory loss or sensory loss Endocrine: Denies change in weight, skin, hair change, nocturia, and paresthesia, diabetic polys, visual blurring or hyper / hypo glycemic episodes.  Heme/Lymph: No excessive bleeding, bruising or enlarged lymph nodes.  Physical Exam  BP 134/74  P56  T 97.9 F   R 16  Ht 5' 9.25"   Wt 165 lb 12.8 oz   BMI 24.31 kg/m2  General Appearance: Well nourished, in no apparent distress. Eyes: PERRLA, EOMs, conjunctiva no swelling or erythema, normal fundi and vessels. Sinuses: No frontal/maxillary tenderness ENT/Mouth: EACs patent / TMs  nl. Nares clear without erythema, swelling, mucoid exudates. Oral hygiene is good. No erythema, swelling, or exudate. Tongue normal, non-obstructing. Tonsils not swollen or erythematous. Hearing normal.  Neck: Supple, thyroid normal. No bruits, nodes or JVD. Respiratory: Respiratory effort normal.   BS equal and clear bilateral without rales, rhonci, wheezing or stridor. Cardio: Heart sounds are normal with regular rate and rhythm and no murmurs, rubs or gallops. Peripheral pulses are normal and equal bilaterally without edema. No aortic or femoral bruits. Chest: symmetric with normal excursions and percussion.  Abdomen: Flat, soft, with bowl sounds. Nontender, no guarding, rebound, hernias, masses, or organomegaly.  Lymphatics: Non tender without lymphadenopathy.  Genitourinary: No hernias.Testes nl. DRE - prostate nl for age - smooth & firm w/o nodules. Musculoskeletal: Full ROM all peripheral extremities, joint stability, 5/5 strength, and normal gait. Denies falls. Skin: Warm and dry without rashes, lesions, cyanosis, clubbing or  ecchymosis.  Neuro: Cranial nerves intact, reflexes equal bilaterally. Normal muscle tone, no cerebellar symptoms. Sensation intact.  Pysch: Awake and oriented X 3with normal affect, insight and judgment appropriate. Denies Depression.  Assessment and Plan  1. Annual Comprehensive  Examination 2. Hypertension  3. Hyperlipidemia 4. T1_IDDM w/Stage 2 CKD 5. Vitamin D Deficiency  Continue prudent diet as discussed, weight control, BP  monitoring, regular exercise, and medications as discussed.  Discussed med effects and SE's. Routine screening labs and tests as requested with regular follow-up as recommended.

## 2013-08-15 NOTE — Patient Instructions (Signed)
Recommend the book "the END of DIETING" by Dr Joel Furman   and the  Book "The END of DIABETES " by Dr Joel Fuhrman  At [employer] - get book & Audio CD's      Being diabetic has a  300% increased risk for heart attack, stroke, cancer, and alzheimer- type vascular dementia. It is very important that you work harder with diet by avoiding all foods that are white except chicken & fish. Avoid white rice (brown & wild rice is OK), white potatoes (sweetpotatoes in moderation is OK), White bread or wheat bread or anything made out of white flour like bagels, donuts, rolls, buns, biscuits, cakes, pastries, cookies, pizza crust, and pasta (made from white flour & egg whites) - vegetarian pasta or spinach or wheat pasta is OK. Multigrain breads like Arnold's or Pepper[employer], or multigrain sandwich thins or flatbreads.  Diet, exercise and weight loss can reverse and cure diabetes in the early stages.  Diet, exercise and weight loss is very important in the control and prevention of complications of diabetes which affects every system in your body, ie. Brain - dementia/stroke, eyes - glaucoma/blindness, heart - heart attack/heart failure, kidneys - dialysis, stomach - gastric paralysis, intestines - malabsorption, nerves - severe painful neuritis, circulation - gangrene & loss of a leg(s), and finally cancer and Alzheimers.    I recommend avoid fried & greasy foods,  sweets/candy, white rice (brown or wild rice or Quinoa is OK), white potatoes (sweet potatoes are OK) - anything made from white flour - bagels, doughnuts, rolls, buns, biscuits,white and wheat breads, pizza crust and traditional pasta made of white flour & egg white(vegetarian pasta or spinach or wheat pasta is OK).  Multi-grain bread is OK - like multi-grain flat bread or sandwich thins. Avoid alcohol in excess. Exercise is also important.    Eat all the vegetables you want - avoid meat, especially red meat and dairy - especially cheese.  Cheese is  the most concentrated form of trans-fats which is the worst thing to clog up our arteries. Veggie cheese is OK which can be found in the fresh produce section at [employer] or [employer] or [employer]   Preventative Care for Adults, Male       REGULAR HEALTH EXAMS:  A routine yearly physical is a good way to check in with your primary care Luisalberto Beegle about your health and preventive screening. It is also an opportunity to share updates about your health and any concerns you have, and receive a thorough all-over exam.   Most health insurance companies pay for at least some preventative services.  Check with your health plan for specific coverages.  WHAT PREVENTATIVE SERVICES DO MEN NEED?  Adult men should have their weight and blood pressure checked regularly.   Men age 35 and older should have their cholesterol levels checked regularly.  Beginning at age 50 and continuing to age 75, men should be screened for colorectal cancer.  Certain people should may need continued testing until age 85.  Other cancer screening may include exams for testicular and prostate cancer.  Updating vaccinations is part of preventative care.  Vaccinations help protect against diseases such as the flu.  Lab tests are generally done as part of preventative care to screen for anemia and blood disorders, to screen for problems with the kidneys and liver, to screen for bladder problems, to check blood sugar, and to check your cholesterol level.  Preventative services generally include counseling about diet, exercise, avoiding   tobacco, drugs, excessive alcohol consumption, and sexually transmitted infections.    GENERAL RECOMMENDATIONS FOR GOOD HEALTH:  Healthy diet:  Eat a variety of foods, including fruit, vegetables, animal or vegetable protein, such as meat, fish, chicken, and eggs, or beans, lentils, tofu, and grains, such as rice.  Drink plenty of water daily.  Decrease saturated fat in the diet, avoid  lots of red meat, processed foods, sweets, fast foods, and fried foods.  Exercise:  Aerobic exercise helps maintain good heart health. At least 30-40 minutes of moderate-intensity exercise is recommended. For example, a brisk walk that increases your heart rate and breathing. This should be done on most days of the week.   Find a type of exercise or a variety of exercises that you enjoy so that it becomes a part of your daily life.  Examples are running, walking, swimming, water aerobics, and biking.  For motivation and support, explore group exercise such as aerobic class, spin class, Zumba, Yoga,or  martial arts, etc.    Set exercise goals for yourself, such as a certain weight goal, walk or run in a race such as a 5k walk/run.  Speak to your primary care Riata Ikeda about exercise goals.  Disease prevention:  If you smoke or chew tobacco, find out from your caregiver how to quit. It can literally save your life, no matter how long you have been a tobacco user. If you do not use tobacco, never begin.   Maintain a healthy diet and normal weight. Increased weight leads to problems with blood pressure and diabetes.   The Body Mass Index or BMI is a way of measuring how much of your body is fat. Having a BMI above 27 increases the risk of heart disease, diabetes, hypertension, stroke and other problems related to obesity. Your caregiver can help determine your BMI and based on it develop an exercise and dietary program to help you achieve or maintain this important measurement at a healthful level.  High blood pressure causes heart and blood vessel problems.  Persistent high blood pressure should be treated with medicine if weight loss and exercise do not work.   Fat and cholesterol leaves deposits in your arteries that can block them. This causes heart disease and vessel disease elsewhere in your body.  If your cholesterol is found to be high, or if you have heart disease or certain other medical  conditions, then you may need to have your cholesterol monitored frequently and be treated with medication.   Ask if you should have a stress test if your history suggests this. A stress test is a test done on a treadmill that looks for heart disease. This test can find disease prior to there being a problem.  Avoid drinking alcohol in excess (more than two drinks per day).  Avoid use of street drugs. Do not share needles with anyone. Ask for professional help if you need assistance or instructions on stopping the use of alcohol, cigarettes, and/or drugs.  Brush your teeth twice a day with fluoride toothpaste, and floss once a day. Good oral hygiene prevents tooth decay and gum disease. The problems can be painful, unattractive, and can cause other health problems. Visit your dentist for a routine oral and dental check up and preventive care every 6-12 months.   Look at your skin regularly.  Use a mirror to look at your back. Notify your caregivers of changes in moles, especially if there are changes in shapes, colors, a size larger than a   pencil eraser, an irregular border, or development of new moles.  Safety:  Use seatbelts 100% of the time, whether driving or as a passenger.  Use safety devices such as hearing protection if you work in environments with loud noise or significant background noise.  Use safety glasses when doing any work that could send debris in to the eyes.  Use a helmet if you ride a bike or motorcycle.  Use appropriate safety gear for contact sports.  Talk to your caregiver about gun safety.  Use sunscreen with a SPF (or skin protection factor) of 15 or greater.  Lighter skinned people are at a greater risk of skin cancer. Don't forget to also wear sunglasses in order to protect your eyes from too much damaging sunlight. Damaging sunlight can accelerate cataract formation.   Practice safe sex. Use condoms. Condoms are used for birth control and to help reduce the spread of  sexually transmitted infections (or STIs).  Some of the STIs are gonorrhea (the clap), chlamydia, syphilis, trichomonas, herpes, HPV (human papilloma virus) and HIV (human immunodeficiency virus) which causes AIDS. The herpes, HIV and HPV are viral illnesses that have no cure. These can result in disability, cancer and death.   Keep carbon monoxide and smoke detectors in your home functioning at all times. Change the batteries every 6 months or use a model that plugs into the wall.   Vaccinations:  Stay up to date with your tetanus shots and other required immunizations. You should have a booster for tetanus every 10 years. Be sure to get your flu shot every year, since 5%-20% of the [place] population comes down with the flu. The flu vaccine changes each year, so being vaccinated once is not enough. Get your shot in the fall, before the flu season peaks.   Other vaccines to consider:  Pneumococcal vaccine to protect against certain types of pneumonia.  This is normally recommended for adults age 65 or older.  However, adults younger than 77 years old with certain underlying conditions such as diabetes, heart or lung disease should also receive the vaccine.  Shingles vaccine to protect against Varicella Zoster if you are older than age 60, or younger than 77 years old with certain underlying illness.  Hepatitis A vaccine to protect against a form of infection of the liver by a virus acquired from food.  Hepatitis B vaccine to protect against a form of infection of the liver by a virus acquired from blood or body fluids, particularly if you work in health care.  If you plan to travel internationally, check with your local health department for specific vaccination recommendations.  Cancer Screening:  Most routine colon cancer screening begins at the age of 50. On a yearly basis, doctors may provide special easy to use take-home tests to check for hidden blood in the stool. Sigmoidoscopy or  colonoscopy can detect the earliest forms of colon cancer and is life saving. These tests use a small camera at the end of a tube to directly examine the colon. Speak to your caregiver about this at age 50, when routine screening begins (and is repeated every 5 years unless early forms of pre-cancerous polyps or small growths are found).   At the age of 50 men usually start screening for prostate cancer every year. Screening may begin at a younger age for those with higher risk. Those at higher risk include African-Americans or having a family history of prostate cancer. There are two types of tests for prostate   cancer:   Prostate-specific antigen (PSA) testing. Recent studies raise questions about prostate cancer using PSA and you should discuss this with your caregiver.   Digital rectal exam (in which your doctor's lubricated and gloved finger feels for enlargement of the prostate through the anus).   Screening for testicular cancer.  Do a monthly exam of your testicles. Gently roll each testicle between your thumb and fingers, feeling for any abnormal lumps. The best time to do this is after a hot shower or bath when the tissues are looser. Notify your caregivers of any lumps, tenderness or changes in size or shape immediately.     

## 2013-08-16 ENCOUNTER — Encounter: Payer: Self-pay | Admitting: Gastroenterology

## 2013-08-16 LAB — VITAMIN D 25 HYDROXY (VIT D DEFICIENCY, FRACTURES): VIT D 25 HYDROXY: 94 ng/mL — AB (ref 30–89)

## 2013-08-16 LAB — TSH: TSH: 0.987 u[IU]/mL (ref 0.350–4.500)

## 2013-08-16 LAB — HEPATIC FUNCTION PANEL
ALBUMIN: 4.4 g/dL (ref 3.5–5.2)
ALK PHOS: 46 U/L (ref 39–117)
ALT: 25 U/L (ref 0–53)
AST: 26 U/L (ref 0–37)
BILIRUBIN TOTAL: 0.6 mg/dL (ref 0.2–1.2)
Bilirubin, Direct: 0.1 mg/dL (ref 0.0–0.3)
Indirect Bilirubin: 0.5 mg/dL (ref 0.2–1.2)
Total Protein: 6.3 g/dL (ref 6.0–8.3)

## 2013-08-16 LAB — URINALYSIS, MICROSCOPIC ONLY
Bacteria, UA: NONE SEEN
Casts: NONE SEEN
Crystals: NONE SEEN
SQUAMOUS EPITHELIAL / LPF: NONE SEEN

## 2013-08-16 LAB — BASIC METABOLIC PANEL WITH GFR
BUN: 27 mg/dL — ABNORMAL HIGH (ref 6–23)
CHLORIDE: 99 meq/L (ref 96–112)
CO2: 24 mEq/L (ref 19–32)
CREATININE: 1.04 mg/dL (ref 0.50–1.35)
Calcium: 9.4 mg/dL (ref 8.4–10.5)
GFR, EST AFRICAN AMERICAN: 80 mL/min
GFR, EST NON AFRICAN AMERICAN: 69 mL/min
Glucose, Bld: 152 mg/dL — ABNORMAL HIGH (ref 70–99)
Potassium: 4.8 mEq/L (ref 3.5–5.3)
Sodium: 138 mEq/L (ref 135–145)

## 2013-08-16 LAB — INSULIN, FASTING: Insulin fasting, serum: 40 u[IU]/mL — ABNORMAL HIGH (ref 3–28)

## 2013-08-16 LAB — LIPID PANEL
CHOLESTEROL: 143 mg/dL (ref 0–200)
HDL: 46 mg/dL (ref >39)
LDL CALC: 70 mg/dL (ref 0–99)
TRIGLYCERIDES: 133 mg/dL (ref <150)
Total CHOL/HDL Ratio: 3.1 Ratio
VLDL: 27 mg/dL (ref 0–40)

## 2013-08-16 LAB — MICROALBUMIN / CREATININE URINE RATIO
CREATININE, URINE: 184.8 mg/dL
Microalb Creat Ratio: 3.6 mg/g (ref 0.0–30.0)
Microalb, Ur: 0.66 mg/dL (ref 0.00–1.89)

## 2013-08-16 LAB — MAGNESIUM: MAGNESIUM: 1.7 mg/dL (ref 1.5–2.5)

## 2013-08-16 LAB — PSA: PSA: 0.05 ng/mL (ref <=4.00)

## 2013-08-18 ENCOUNTER — Other Ambulatory Visit: Payer: Self-pay | Admitting: Internal Medicine

## 2013-08-19 ENCOUNTER — Other Ambulatory Visit: Payer: Self-pay | Admitting: Internal Medicine

## 2013-08-29 ENCOUNTER — Other Ambulatory Visit (INDEPENDENT_AMBULATORY_CARE_PROVIDER_SITE_OTHER): Payer: Medicare Other

## 2013-08-29 DIAGNOSIS — Z1212 Encounter for screening for malignant neoplasm of rectum: Secondary | ICD-10-CM

## 2013-08-29 LAB — POC HEMOCCULT BLD/STL (HOME/3-CARD/SCREEN)
Card #2 Fecal Occult Blod, POC: NEGATIVE
Card #3 Fecal Occult Blood, POC: NEGATIVE
FECAL OCCULT BLD: NEGATIVE

## 2013-09-05 ENCOUNTER — Other Ambulatory Visit: Payer: Self-pay | Admitting: Internal Medicine

## 2013-09-05 ENCOUNTER — Other Ambulatory Visit: Payer: Self-pay | Admitting: Physician Assistant

## 2013-10-11 ENCOUNTER — Other Ambulatory Visit: Payer: Self-pay | Admitting: Physician Assistant

## 2013-10-17 ENCOUNTER — Ambulatory Visit (AMBULATORY_SURGERY_CENTER): Payer: Self-pay

## 2013-10-17 VITALS — Ht 69.0 in | Wt 169.0 lb

## 2013-10-17 DIAGNOSIS — Z860100 Personal history of colon polyps, unspecified: Secondary | ICD-10-CM

## 2013-10-17 MED ORDER — MOVIPREP 100 G PO SOLR: 1.0000 | Freq: Once | ORAL | Status: DC

## 2013-10-17 NOTE — Progress Notes (Signed)
No allergies to eggs or soy No home oxygen No diet/weight loss meds No past problems with anesthesia  Has email  Emmi instructions given for colonoscopy 

## 2013-10-23 ENCOUNTER — Other Ambulatory Visit: Payer: Self-pay | Admitting: Emergency Medicine

## 2013-10-31 ENCOUNTER — Encounter: Payer: Self-pay | Admitting: Gastroenterology

## 2013-10-31 ENCOUNTER — Ambulatory Visit (AMBULATORY_SURGERY_CENTER): Payer: Medicare Other | Admitting: Gastroenterology

## 2013-10-31 VITALS — BP 161/64 | HR 59 | Temp 97.3°F | Resp 14 | Ht 69.0 in | Wt 169.0 lb

## 2013-10-31 DIAGNOSIS — I10 Essential (primary) hypertension: Secondary | ICD-10-CM | POA: Diagnosis not present

## 2013-10-31 DIAGNOSIS — Z1211 Encounter for screening for malignant neoplasm of colon: Secondary | ICD-10-CM

## 2013-10-31 DIAGNOSIS — Z86010 Personal history of colonic polyps: Secondary | ICD-10-CM

## 2013-10-31 DIAGNOSIS — E119 Type 2 diabetes mellitus without complications: Secondary | ICD-10-CM | POA: Diagnosis not present

## 2013-10-31 LAB — GLUCOSE, CAPILLARY
GLUCOSE-CAPILLARY: 139 mg/dL — AB (ref 70–99)
Glucose-Capillary: 123 mg/dL — ABNORMAL HIGH (ref 70–99)

## 2013-10-31 MED ORDER — SODIUM CHLORIDE 0.9 % IV SOLN: 500.0000 mL | INTRAVENOUS | Status: DC

## 2013-10-31 NOTE — Progress Notes (Signed)
Report to PACU, RN, vss, BBS= Clear.  

## 2013-10-31 NOTE — Patient Instructions (Signed)
YOU HAD AN ENDOSCOPIC PROCEDURE TODAY AT THE [facility]: Refer to the procedure report that was given to you for any specific questions about what was found during the examination.  If the procedure report does not answer your questions, please call your gastroenterologist to clarify.  If you requested that your care partner not be given the details of your procedure findings, then the procedure report has been included in a sealed envelope for you to review at your convenience later.  YOU SHOULD EXPECT: Some feelings of bloating in the abdomen. Passage of more gas than usual.  Walking can help get rid of the air that was put into your GI tract during the procedure and reduce the bloating. If you had a lower endoscopy (such as a colonoscopy or flexible sigmoidoscopy) you may notice spotting of blood in your stool or on the toilet paper. If you underwent a bowel prep for your procedure, then you may not have a normal bowel movement for a few days.  DIET: Your first meal following the procedure should be a light meal and then it is ok to progress to your normal diet.  A half-sandwich or bowl of soup is an example of a good first meal.  Heavy or fried foods are harder to digest and may make you feel nauseous or bloated.  Likewise meals heavy in dairy and vegetables can cause extra gas to form and this can also increase the bloating.  Drink plenty of fluids but you should avoid alcoholic beverages for 24 hours.  ACTIVITY: Your care partner should take you home directly after the procedure.  You should plan to take it easy, moving slowly for the rest of the day.  You can resume normal activity the day after the procedure however you should NOT DRIVE or use heavy machinery for 24 hours (because of the sedation medicines used during the test).    SYMPTOMS TO REPORT IMMEDIATELY: A gastroenterologist can be reached at any hour.  During normal business hours, 8:30 AM to 5:00 PM Monday through Friday,  call [phone].  After hours and on weekends, please call the GI answering service at [phone] who will take a message and have the physician on call contact you.   Following lower endoscopy (colonoscopy or flexible sigmoidoscopy):  Excessive amounts of blood in the stool  Significant tenderness or worsening of abdominal pains  Swelling of the abdomen that is new, acute  Fever of 100F or higher    FOLLOW UP: If any biopsies were taken you will be contacted by phone or by letter within the next 1-3 weeks.  Call your gastroenterologist if you have not heard about the biopsies in 3 weeks.  Our staff will call the home number listed on your records the next business day following your procedure to check on you and address any questions or concerns that you may have at that time regarding the information given to you following your procedure. This is a courtesy call and so if there is no answer at the home number and we have not heard from you through the emergency physician on call, we will assume that you have returned to your regular daily activities without incident.  SIGNATURES/CONFIDENTIALITY: You and/or your care partner have signed paperwork which will be entered into your electronic medical record.  These signatures attest to the fact that that the information above on your After Visit Summary has been reviewed and is understood.  Full responsibility of the confidentiality   of this discharge information lies with you and/or your care-partner.     

## 2013-10-31 NOTE — Op Note (Signed)
 [facility] [address] [place], [address]   COLONOSCOPY PROCEDURE REPORT  PATIENT: Gerald [name], Gerald [name]  MR#: [id number] BIRTHDATE: 01/16/1935 , 78  yrs. old GENDER: Male ENDOSCOPIST: Louis Meckel, MD REFERRED [id number] Oneta Rack, M.D. PROCEDURE DATE:  10/31/2013 PROCEDURE:   Colonoscopy, diagnostic First Screening Colonoscopy - Avg.  risk and is 50 yrs.  old or older - No.  Prior Negative Screening - Now for repeat screening. 10 or more years since last screening  History of Adenoma - Now for follow-up colonoscopy & has been > or = to 3 yrs.  N/A  Polyps Removed Today? No.  Recommend repeat exam, <10 yrs? No. ASA CLASS:   Class II INDICATIONS:Average risk patient for colon cancer. MEDICATIONS: MAC sedation, administered by CRNA and propofol (Diprivan)  IV  DESCRIPTION OF PROCEDURE:   After the risks benefits and alternatives of the procedure were thoroughly explained, informed consent was obtained.  A digital rectal exam revealed no abnormalities of the rectum.   The LB [id number]  endoscope was introduced through the anus and advanced to the cecum, which was identified by both the appendix and ileocecal valve. No adverse events experienced.   The quality of the prep was excellent using Suprep  The instrument was then slowly withdrawn as the colon was fully examined.      COLON FINDINGS: A normal appearing cecum, ileocecal valve, and appendiceal orifice were identified.  The ascending, hepatic flexure, transverse, splenic flexure, descending, sigmoid colon and rectum appeared unremarkable.  No polyps or cancers were seen. Retroflexed views revealed no abnormalities. The time to cecum=6 minutes 99 seconds.  Withdrawal time=6 minutes 05 seconds.  The scope was withdrawn and the procedure completed. COMPLICATIONS: There were no complications.  ENDOSCOPIC IMPRESSION: Normal colon  RECOMMENDATIONS: Given your age, you will not need another  colonoscopy for colon cancer screening or polyp surveillance.  These types of tests usually stop around the age 40.   eSigned:  Louis Meckel, MD 10/31/2013 8:59 AM   cc:

## 2013-10-31 NOTE — Progress Notes (Signed)
Patient denies any allergies to eggs or soy. Patient denies any problems with anesthesia/sedation. Patient denies any oxygen use at home and does not take any diet/weight loss medications.  

## 2013-11-01 ENCOUNTER — Telehealth: Payer: Self-pay | Admitting: *Deleted

## 2013-11-01 NOTE — Telephone Encounter (Signed)
  Follow up Call-  Call back number 10/31/2013  Post procedure Call Back phone  # [phone]  Permission to leave phone message Yes     Patient questions:  Do you have a fever, pain , or abdominal swelling? No. Pain Score  0 *  Have you tolerated food without any problems? Yes.    Have you been able to return to your normal activities? Yes.    Do you have any questions about your discharge instructions: Diet   No. Medications  No. Follow up visit  No.  Do you have questions or concerns about your Care? No.  Actions: * If pain score is 4 or above: No action needed, pain <4.

## 2013-11-09 ENCOUNTER — Other Ambulatory Visit: Payer: Self-pay | Admitting: Physician Assistant

## 2013-11-17 ENCOUNTER — Other Ambulatory Visit: Payer: Self-pay | Admitting: Internal Medicine

## 2013-11-28 ENCOUNTER — Other Ambulatory Visit: Payer: Self-pay | Admitting: Emergency Medicine

## 2013-11-29 DIAGNOSIS — M85811 Other specified disorders of bone density and structure, right shoulder: Secondary | ICD-10-CM | POA: Diagnosis not present

## 2013-11-29 DIAGNOSIS — M75121 Complete rotator cuff tear or rupture of right shoulder, not specified as traumatic: Secondary | ICD-10-CM | POA: Diagnosis not present

## 2013-11-29 DIAGNOSIS — M75122 Complete rotator cuff tear or rupture of left shoulder, not specified as traumatic: Secondary | ICD-10-CM | POA: Insufficient documentation

## 2013-11-29 DIAGNOSIS — M75101 Unspecified rotator cuff tear or rupture of right shoulder, not specified as traumatic: Secondary | ICD-10-CM | POA: Diagnosis not present

## 2013-11-29 DIAGNOSIS — M25512 Pain in left shoulder: Secondary | ICD-10-CM | POA: Diagnosis not present

## 2013-11-29 DIAGNOSIS — M19011 Primary osteoarthritis, right shoulder: Secondary | ICD-10-CM | POA: Diagnosis not present

## 2013-11-29 DIAGNOSIS — M25511 Pain in right shoulder: Secondary | ICD-10-CM | POA: Diagnosis not present

## 2013-11-29 DIAGNOSIS — M65332 Trigger finger, left middle finger: Secondary | ICD-10-CM | POA: Diagnosis not present

## 2013-11-29 DIAGNOSIS — M6530 Trigger finger, unspecified finger: Secondary | ICD-10-CM | POA: Insufficient documentation

## 2013-12-03 ENCOUNTER — Ambulatory Visit (INDEPENDENT_AMBULATORY_CARE_PROVIDER_SITE_OTHER): Payer: Medicare Other | Admitting: Physician Assistant

## 2013-12-03 ENCOUNTER — Encounter: Payer: Self-pay | Admitting: Physician Assistant

## 2013-12-03 VITALS — BP 140/70 | HR 60 | Temp 97.9°F | Resp 16 | Ht 69.25 in | Wt 165.0 lb

## 2013-12-03 DIAGNOSIS — Z23 Encounter for immunization: Secondary | ICD-10-CM

## 2013-12-03 DIAGNOSIS — E782 Mixed hyperlipidemia: Secondary | ICD-10-CM

## 2013-12-03 DIAGNOSIS — E038 Other specified hypothyroidism: Secondary | ICD-10-CM | POA: Diagnosis not present

## 2013-12-03 DIAGNOSIS — Z716 Tobacco abuse counseling: Secondary | ICD-10-CM | POA: Diagnosis not present

## 2013-12-03 DIAGNOSIS — F17210 Nicotine dependence, cigarettes, uncomplicated: Secondary | ICD-10-CM | POA: Diagnosis not present

## 2013-12-03 DIAGNOSIS — N182 Chronic kidney disease, stage 2 (mild): Secondary | ICD-10-CM

## 2013-12-03 DIAGNOSIS — E559 Vitamin D deficiency, unspecified: Secondary | ICD-10-CM

## 2013-12-03 DIAGNOSIS — E1122 Type 2 diabetes mellitus with diabetic chronic kidney disease: Secondary | ICD-10-CM | POA: Diagnosis not present

## 2013-12-03 DIAGNOSIS — I10 Essential (primary) hypertension: Secondary | ICD-10-CM

## 2013-12-03 DIAGNOSIS — Z79899 Other long term (current) drug therapy: Secondary | ICD-10-CM

## 2013-12-03 DIAGNOSIS — E1129 Type 2 diabetes mellitus with other diabetic kidney complication: Secondary | ICD-10-CM | POA: Diagnosis not present

## 2013-12-03 LAB — HEPATIC FUNCTION PANEL
ALK PHOS: 47 U/L (ref 39–117)
ALT: 30 U/L (ref 0–53)
AST: 24 U/L (ref 0–37)
Albumin: 4.4 g/dL (ref 3.5–5.2)
Bilirubin, Direct: 0.1 mg/dL (ref 0.0–0.3)
Indirect Bilirubin: 0.4 mg/dL (ref 0.2–1.2)
TOTAL PROTEIN: 6.2 g/dL (ref 6.0–8.3)
Total Bilirubin: 0.5 mg/dL (ref 0.2–1.2)

## 2013-12-03 LAB — BASIC METABOLIC PANEL WITH GFR
BUN: 20 mg/dL (ref 6–23)
CHLORIDE: 107 meq/L (ref 96–112)
CO2: 24 meq/L (ref 19–32)
CREATININE: 1.07 mg/dL (ref 0.50–1.35)
Calcium: 9.4 mg/dL (ref 8.4–10.5)
GFR, Est African American: 78 mL/min
GFR, Est Non African American: 67 mL/min
GLUCOSE: 102 mg/dL — AB (ref 70–99)
Potassium: 4.2 mEq/L (ref 3.5–5.3)
Sodium: 140 mEq/L (ref 135–145)

## 2013-12-03 LAB — CBC WITH DIFFERENTIAL/PLATELET
Basophils Absolute: 0.1 10*3/uL (ref 0.0–0.1)
Basophils Relative: 1 % (ref 0–1)
EOS PCT: 1 % (ref 0–5)
Eosinophils Absolute: 0.1 10*3/uL (ref 0.0–0.7)
HEMATOCRIT: 37.9 % — AB (ref 39.0–52.0)
Hemoglobin: 13.1 g/dL (ref 13.0–17.0)
LYMPHS ABS: 1.5 10*3/uL (ref 0.7–4.0)
Lymphocytes Relative: 27 % (ref 12–46)
MCH: 30.8 pg (ref 26.0–34.0)
MCHC: 34.6 g/dL (ref 30.0–36.0)
MCV: 89.2 fL (ref 78.0–100.0)
MONO ABS: 0.6 10*3/uL (ref 0.1–1.0)
Monocytes Relative: 10 % (ref 3–12)
Neutro Abs: 3.4 10*3/uL (ref 1.7–7.7)
Neutrophils Relative %: 61 % (ref 43–77)
Platelets: 168 10*3/uL (ref 150–400)
RBC: 4.25 MIL/uL (ref 4.22–5.81)
RDW: 13.5 % (ref 11.5–15.5)
WBC: 5.6 10*3/uL (ref 4.0–10.5)

## 2013-12-03 LAB — LIPID PANEL
CHOL/HDL RATIO: 2.3 ratio
Cholesterol: 119 mg/dL (ref 0–200)
HDL: 52 mg/dL (ref >39)
LDL Cholesterol: 44 mg/dL (ref 0–99)
Triglycerides: 116 mg/dL (ref <150)
VLDL: 23 mg/dL (ref 0–40)

## 2013-12-03 LAB — HEMOGLOBIN A1C
HEMOGLOBIN A1C: 7 % — AB (ref <5.7)
Mean Plasma Glucose: 154 mg/dL — ABNORMAL HIGH (ref <117)

## 2013-12-03 LAB — MAGNESIUM: MAGNESIUM: 1.8 mg/dL (ref 1.5–2.5)

## 2013-12-03 LAB — TSH: TSH: 1.759 u[IU]/mL (ref 0.350–4.500)

## 2013-12-03 NOTE — Progress Notes (Signed)
Assessment and Plan:  Hypertension: Continue medication, monitor blood pressure at home. Continue DASH diet. Cholesterol: Continue diet and exercise. Check cholesterol.  Diabetes with CKD- Recent steroid injection, Continue diet and exercise. Check A1C Vitamin D Def- check level and continue medications.   Continue diet and meds as discussed. Further disposition pending results of labs. Discussed med's effects and SE's.    HPI 79 y.o. male  presents for 3 month follow up with hypertension, hyperlipidemia, diabetes and vitamin D. His blood pressure has been controlled at home, today their BP is BP: 140/70 mmHg He does workout, walks, golfs, and works out in the yard. He denies chest pain, shortness of breath, dizziness.  He is on cholesterol medication and denies myalgias. His cholesterol is at goal. The cholesterol last visit was:   Lab Results  Component Value Date   CHOL 143 08/15/2013   HDL 46 08/15/2013   LDLCALC 70 08/15/2013   TRIG 133 08/15/2013   CHOLHDL 3.1 08/15/2013   He has been working on diet and exercise for Diabetes, he has CKD stage II due to DM, he is insulin dependent on lantus 40 units daily and MF and on an ARB, he states his sugars have been uncontrolled to do steroid shot in his right shoulder, the highest he saw was 375,  and denies polydipsia, polyuria and visual disturbances. Last A1C in the office was:  Lab Results  Component Value Date   HGBA1C 6.8* 08/15/2013   Patient is on Vitamin D supplement. Lab Results  Component Value Date   VD25OH 3894* 08/15/2013     He is on thyroid medication. His medication was not changed last visit. Patient denies nervousness, palpitations and weight changes.  Lab Results  Component Value Date   TSH 0.987 08/15/2013  .  His only complaint is his right shoulder, he has had his rotator cuff repaired 3 separate times. [facility] and they states there is nothing else to be done, he got a steroid  shot, which increased his sugars.   Current Medications:  Current Outpatient Prescriptions on File Prior to Visit  Medication Sig Dispense Refill  . baclofen (LIORESAL) 10 MG tablet TAKE 1/2-1 TABLET BY MOUTH 2-3 TIMES A DAY AS NEEDED FOR MUSCLE SPASMS  60 tablet  1  . celecoxib (CELEBREX) 200 MG capsule TAKE 1 CAPSULE BY MOUTH EVERY DAY FOR PAIN AND INFLAMMATION  90 capsule  99  . Cholecalciferol (VITAMIN D PO) Take 5,000 Units by mouth daily.      . finasteride (PROSCAR) 5 MG tablet TAKE 1 TABLET BY MOUTH EVERY DAY  90 tablet  5  . fish oil-omega-3 fatty acids 1000 MG capsule Take 2 g by mouth daily.      . Insulin Pen Needle 31G X 5 MM MISC Check blood sugar 3 times daily for fluctuating blood sugars  100 each  5  . labetalol (NORMODYNE) 100 MG tablet Take 100 mg by mouth 2 (two) times daily. 100MG  IN AM / 200MG  PM      . LANTUS SOLOSTAR 100 UNIT/ML Solostar Pen USE AS DIRECTED  15 mL  0  . levothyroxine (SYNTHROID, LEVOTHROID) 150 MCG tablet TAKE 1 TABLET BY MOUTH EVERY MORNING  90 tablet  0  . losartan-hydrochlorothiazide (HYZAAR) 100-25 MG per tablet TAKE 1 TABLET BY MOUTH EVERY DAY FOR HIGH BLOOD PRESSURE  30 tablet  PRN  . metFORMIN (GLUCOPHAGE-XR) 500 MG 24 hr tablet TAKE 4 TABLETS BY MOUTH  EVERY DAY FOR DIABETES  360 tablet  0  . minocycline (MINOCIN,DYNACIN) 100 MG capsule TAKE ONE CAPSULE BY MOUTH DAILY  90 capsule  3  . Multiple Vitamin (MULTIVITAMIN) tablet Take 1 tablet by mouth daily.      Marland Kitchen NEXIUM 40 MG capsule TAKE 1 CAPSULE BY MOUTH EVERY DAY  90 capsule  3  . simvastatin (ZOCOR) 40 MG tablet TAKE 1 TABLET BY MOUTH EVERY NIGHT AT BEDTIME FOR CHOLESTEROL  90 tablet  PRN  . valACYclovir (VALTREX) 500 MG tablet Take 1 tablet (500 mg total) by mouth 2 (two) times daily. For Fever Blisters  60 tablet  99  . ZETIA 10 MG tablet TAKE 1 TABLET BY MOUTH DAILY  30 tablet  6   No current facility-administered medications on file prior to visit.   Medical History:  Past Medical  History  Diagnosis Date  . Hypertension   . Hyperlipidemia   . Arthritis of shoulder BILATERAL  . GERD (gastroesophageal reflux disease)   . BPH (benign prostatic hypertrophy)   . Hyperthyroidism   . Rotator cuff tear, right RECURRENT  . Diabetes mellitus ORAL MED AND INSULIN  . Vitamin D deficiency    Allergies:  Allergies  Allergen Reactions  . Ace Inhibitors   . Beta Adrenergic Blockers      Review of Systems: [X]  = complains of  [ ]  = denies  General: Fatigue [ ]  Fever [ ]  Chills [ ]  Weakness [ ]   Insomnia [ ]  Eyes: Redness [ ]  Blurred vision [ ]  Diplopia [ ]   ENT: Congestion [ ]  Sinus Pain [ ]  Post Nasal Drip [ ]  Sore Throat [ ]  Earache [ ]   Cardiac: Chest pain/pressure [ ]  SOB [ ]  Orthopnea [ ]   Palpitations [ ]   Paroxysmal nocturnal dyspnea[ ]  Claudication [ ]  Edema [ ]   Pulmonary: Cough [ ]  Wheezing[ ]   SOB [ ]   Snoring [ ]   GI: Nausea [ ]  Vomiting[ ]  Dysphagia[ ]  Heartburn[ ]  Abdominal pain [ ]  Constipation [ ] ; Diarrhea [ ] ; BRBPR [ ]  Melena[ ]  GU: Hematuria[ ]  Dysuria [ ]  Nocturia[ ]  Urgency [ ]   Hesitancy [ ]  Discharge [ ]  Neuro: Headaches[ ]  Vertigo[ ]  Paresthesias[ ]  Spasm [ ]  Speech changes [ ]  Incoordination [ ]   Ortho: Arthritis [x]  Joint pain [x ] Muscle pain [ ]  Joint swelling [ ]  Back Pain [ ]  Skin:  Rash [ ]   Pruritis [ ]  Change in skin lesion [ ]   Psych: Depression[ ]  Anxiety[ ]  Confusion [ ]  Memory loss [ ]   Heme/Lypmh: Bleeding [ ]  Bruising [ ]  Enlarged lymph nodes [ ]   Endocrine: Visual blurring [ ]  Paresthesia [ ]  Polyuria [ ]  Polydypsea [ ]    Heat/cold intolerance [ ]  Hypoglycemia [ ]   Family history- Review and unchanged Social history- Review and unchanged Physical Exam: BP 140/70  Pulse 60  Temp(Src) 97.9 F (36.6 C)  Resp 16  Ht 5' 9.25" (1.759 m)  Wt 165 lb (74.844 kg)  BMI 24.19 kg/m2 Wt Readings from Last 3 Encounters:  12/03/13 165 lb (74.844 kg)  10/31/13 169 lb (76.658 kg)  10/17/13 169 lb (76.658 kg)   General Appearance: Well  nourished, in no apparent distress. Eyes: PERRLA, EOMs, conjunctiva no swelling or erythema Sinuses: No Frontal/maxillary tenderness ENT/Mouth: Ext aud canals clear, TMs without erythema, bulging. No erythema, swelling, or exudate on post pharynx.  Tonsils not swollen or erythematous. Hearing normal.  Neck: Supple, thyroid normal.  Respiratory: Respiratory  effort normal, BS equal bilaterally without rales, rhonchi, wheezing or stridor.  Cardio: RRR with no MRGs. Brisk peripheral pulses without edema.  Abdomen: Soft, + BS.  Non tender, no guarding, rebound, hernias, masses. Lymphatics: Non tender without lymphadenopathy.  Musculoskeletal: Full ROM, 5/5 strength, normal gait.  Skin: Warm, dry without rashes, lesions, ecchymosis.  Neuro: Cranial nerves intact. No cerebellar symptoms. Sensation intact.  Psych: Awake and oriented X 3, normal affect, Insight and Judgment appropriate.    Quentin Mullingollier, Gerrianne Aydelott 8:46 AM

## 2013-12-05 DIAGNOSIS — H3531 Nonexudative age-related macular degeneration: Secondary | ICD-10-CM | POA: Diagnosis not present

## 2013-12-21 ENCOUNTER — Other Ambulatory Visit: Payer: Self-pay | Admitting: Internal Medicine

## 2014-01-03 ENCOUNTER — Ambulatory Visit (INDEPENDENT_AMBULATORY_CARE_PROVIDER_SITE_OTHER): Payer: Medicare Other | Admitting: Physician Assistant

## 2014-01-03 ENCOUNTER — Encounter: Payer: Self-pay | Admitting: Physician Assistant

## 2014-01-03 VITALS — BP 138/70 | HR 56 | Temp 97.7°F | Resp 16 | Ht 69.25 in | Wt 164.0 lb

## 2014-01-03 DIAGNOSIS — R350 Frequency of micturition: Secondary | ICD-10-CM | POA: Diagnosis not present

## 2014-01-03 DIAGNOSIS — E1122 Type 2 diabetes mellitus with diabetic chronic kidney disease: Secondary | ICD-10-CM | POA: Diagnosis not present

## 2014-01-03 DIAGNOSIS — M545 Low back pain, unspecified: Secondary | ICD-10-CM

## 2014-01-03 DIAGNOSIS — N182 Chronic kidney disease, stage 2 (mild): Secondary | ICD-10-CM | POA: Diagnosis not present

## 2014-01-03 DIAGNOSIS — E1129 Type 2 diabetes mellitus with other diabetic kidney complication: Secondary | ICD-10-CM | POA: Diagnosis not present

## 2014-01-03 LAB — CBC WITH DIFFERENTIAL/PLATELET
BASOS ABS: 0 10*3/uL (ref 0.0–0.1)
BASOS PCT: 1 % (ref 0–1)
EOS ABS: 0.1 10*3/uL (ref 0.0–0.7)
Eosinophils Relative: 3 % (ref 0–5)
HCT: 40.8 % (ref 39.0–52.0)
HEMOGLOBIN: 13.7 g/dL (ref 13.0–17.0)
Lymphocytes Relative: 34 % (ref 12–46)
Lymphs Abs: 1.5 10*3/uL (ref 0.7–4.0)
MCH: 31.1 pg (ref 26.0–34.0)
MCHC: 33.6 g/dL (ref 30.0–36.0)
MCV: 92.5 fL (ref 78.0–100.0)
MONOS PCT: 8 % (ref 3–12)
Monocytes Absolute: 0.3 10*3/uL (ref 0.1–1.0)
NEUTROS ABS: 2.3 10*3/uL (ref 1.7–7.7)
Neutrophils Relative %: 54 % (ref 43–77)
Platelets: 132 10*3/uL — ABNORMAL LOW (ref 150–400)
RBC: 4.41 MIL/uL (ref 4.22–5.81)
RDW: 14.2 % (ref 11.5–15.5)
WBC: 4.3 10*3/uL (ref 4.0–10.5)

## 2014-01-03 LAB — BASIC METABOLIC PANEL WITH GFR
BUN: 27 mg/dL — AB (ref 6–23)
CALCIUM: 9.3 mg/dL (ref 8.4–10.5)
CO2: 25 mEq/L (ref 19–32)
Chloride: 102 mEq/L (ref 96–112)
Creat: 1.16 mg/dL (ref 0.50–1.35)
GFR, EST AFRICAN AMERICAN: 70 mL/min
GFR, EST NON AFRICAN AMERICAN: 60 mL/min
GLUCOSE: 289 mg/dL — AB (ref 70–99)
Potassium: 4.8 mEq/L (ref 3.5–5.3)
Sodium: 135 mEq/L (ref 135–145)

## 2014-01-03 NOTE — Progress Notes (Signed)
Subjective:    Patient ID: Gerald [name], male    DOB: 11/06/1934, 79 y.o.   MRN: [id number]  HPI 79 y.o. male with remote history of smoking 15 years presents with urinary frequency. For the last 3-4 weeks he has been having urinary frequency every 30-10160mins, with urgency, with left lower back pain denies fever, chills, dribbling, hesitancy, incontinence, hematuria, dysuria, nausea, sweating. He feels it is improving. He is on finasteride.  Lab Results  Component Value Date   PSA 0.05 08/15/2013   Lab Results  Component Value Date   CREATININE 1.07 12/03/2013   BUN 20 12/03/2013   NA 140 12/03/2013   K 4.2 12/03/2013   CL 107 12/03/2013   CO2 24 12/03/2013     Medication List       This list is accurate as of: 01/03/14 11:00 AM.  Always use your most recent med list.               aspirin 81 MG tablet  Take 81 mg by mouth daily.     baclofen 10 MG tablet  Commonly known as:  LIORESAL  TAKE 1/2-1 TABLET BY MOUTH 2-3 TIMES A DAY AS NEEDED FOR MUSCLE SPASMS     celecoxib 200 MG capsule  Commonly known as:  CELEBREX  TAKE 1 CAPSULE BY MOUTH EVERY DAY FOR PAIN AND INFLAMMATION     finasteride 5 MG tablet  Commonly known as:  PROSCAR  TAKE 1 TABLET BY MOUTH EVERY DAY     fish oil-omega-3 fatty acids 1000 MG capsule  Take 2 g by mouth daily.     Insulin Pen Needle 31G X 5 MM Misc  Check blood sugar 3 times daily for fluctuating blood sugars     labetalol 100 MG tablet  Commonly known as:  NORMODYNE  Take 100 mg by mouth 2 (two) times daily. 100MG  IN AM / 200MG  PM     LANTUS SOLOSTAR 100 UNIT/ML Solostar Pen  Generic drug:  Insulin Glargine  USE AS DIRECTED     levothyroxine 150 MCG tablet  Commonly known as:  SYNTHROID, LEVOTHROID  TAKE 1 TABLET BY MOUTH EVERY MORNING     losartan-hydrochlorothiazide 100-25 MG per tablet  Commonly known as:  HYZAAR  TAKE 1 TABLET BY MOUTH EVERY DAY FOR HIGH BLOOD PRESSURE     metFORMIN 500 MG 24 hr tablet  Commonly  known as:  GLUCOPHAGE-XR  TAKE 4 TABLETS BY MOUTH EVERY DAY FOR DIABETES     minocycline 100 MG capsule  Commonly known as:  MINOCIN,DYNACIN  TAKE ONE CAPSULE BY MOUTH DAILY     multivitamin tablet  Take 1 tablet by mouth daily.     NEXIUM 40 MG capsule  Generic drug:  esomeprazole  TAKE 1 CAPSULE BY MOUTH EVERY DAY     simvastatin 40 MG tablet  Commonly known as:  ZOCOR  TAKE 1 TABLET BY MOUTH EVERY NIGHT AT BEDTIME FOR CHOLESTEROL     valACYclovir 500 MG tablet  Commonly known as:  VALTREX  Take 1 tablet (500 mg total) by mouth 2 (two) times daily. For Fever Blisters     VITAMIN D PO  Take 5,000 Units by mouth daily.     ZETIA 10 MG tablet  Generic drug:  ezetimibe  TAKE 1 TABLET BY MOUTH DAILY       Past Medical History  Diagnosis Date  . Hypertension   . Hyperlipidemia   . Arthritis of shoulder BILATERAL  . GERD (  gastroesophageal reflux disease)   . BPH (benign prostatic hypertrophy)   . Hyperthyroidism   . Rotator cuff tear, right RECURRENT  . Diabetes mellitus ORAL MED AND INSULIN  . Vitamin D deficiency     Review of Systems  Constitutional: Negative.  Negative for fever.  HENT: Negative.   Respiratory: Negative.   Cardiovascular: Negative.  Negative for chest pain.  Gastrointestinal: Negative.  Negative for abdominal pain.  Genitourinary: Positive for urgency, frequency and flank pain. Negative for dysuria, hematuria, decreased urine volume, discharge, penile swelling, scrotal swelling, enuresis, difficulty urinating, genital sores, penile pain and testicular pain.  Musculoskeletal: Positive for back pain (left lower back, close to SI). Negative for myalgias, joint swelling, arthralgias, gait problem, neck pain and neck stiffness.  Skin: Negative.   Neurological: Negative.  Negative for weakness, numbness and headaches.   Blood pressure 138/70, pulse 56, temperature 97.7 F (36.5 C), resp. rate 16, height 5' 9.25" (1.759 m), weight 164 lb (74.39  kg).     Objective:   Physical Exam  Constitutional: He is oriented to person, place, and time. He appears well-developed and well-nourished.  HENT:  Head: Normocephalic and atraumatic.  Right Ear: External ear normal.  Left Ear: External ear normal.  Mouth/Throat: Oropharynx is clear and moist.  Eyes: Conjunctivae and EOM are normal. Pupils are equal, round, and reactive to light.  Neck: Normal range of motion. Neck supple.  Cardiovascular: Normal rate, regular rhythm and normal heart sounds.   Pulmonary/Chest: Effort normal and breath sounds normal.  Abdominal: Soft. Bowel sounds are normal.  Musculoskeletal: Normal range of motion.  Neurological: He is alert and oriented to person, place, and time. No cranial nerve deficit.  Skin: Skin is warm and dry.  Psychiatric: He has a normal mood and affect. His behavior is normal.      Assessment & Plan:  Urinary frequency- UTI, prostatitis, OAB- check urine, CBC/BMP, declines meds at this time, declines prostate exam.  Lower left back pain- non tender to touch, RICE.

## 2014-01-03 NOTE — Patient Instructions (Addendum)
Urinary Frequency The number of times a normal person urinates depends upon how much liquid they take in and how much liquid they are losing. If the temperature is hot and there is high humidity, then the person will sweat more and usually breathe a little more frequently. These factors decrease the amount of frequency of urination that would be considered normal. The amount you drink is easily determined, but the amount of fluid lost is sometimes more difficult to calculate.  Fluid is lost in two ways:  Sensible fluid loss is usually measured by the amount of urine that you get rid of. Losses of fluid can also occur with diarrhea.  Insensible fluid loss is more difficult to measure. It is caused by evaporation. Insensible loss of fluid occurs through breathing and sweating. It usually ranges from a little less than a quart to a little more than a quart of fluid a day. In normal temperatures and activity levels, the average person may urinate 4 to 7 times in a 24-hour period. Needing to urinate more often than that could indicate a problem. If one urinates 4 to 7 times in 24 hours and has large volumes each time, that could indicate a different problem from one who urinates 4 to 7 times a day and has small volumes. The time of urinating is also important. Most urinating should be done during the waking hours. Getting up at night to urinate frequently can indicate some problems. CAUSES  The bladder is the organ in your lower abdomen that holds urine. Like a balloon, it swells some as it fills up. Your nerves sense this and tell you it is time to head for the bathroom. There are a number of reasons that you might feel the need to urinate more often than usual. They include:  Urinary tract infection. This is usually associated with other signs such as burning when you urinate.  In men, problems with the prostate (a walnut-size gland that is located near the tube that carries urine out of your body). There  are two reasons why the prostate can cause an increased frequency of urination:  An enlarged prostate that does not let the bladder empty well. If the bladder only half empties when you urinate, then it only has half the capacity to fill before you have to urinate again.  The nerves in the bladder become more hypersensitive with an increased size of the prostate even if the bladder empties completely.  Pregnancy.  Obesity. Excess weight is more likely to cause a problem for women than for men.  Bladder stones or other bladder problems.  Caffeine.  Alcohol.  Medications. For example, drugs that help the body get rid of extra fluid (diuretics) increase urine production. Some other medicines must be taken with lots of fluids.  Muscle or nerve weakness. This might be the result of a spinal cord injury, a stroke, multiple sclerosis, or Parkinson disease.  Long-standing diabetes can decrease the sensation of the bladder. This loss of sensation makes it harder to sense the bladder needs to be emptied. Over a period of years, the bladder is stretched out by constant overfilling. This weakens the bladder muscles so that the bladder does not empty well and has less capacity to fill with new urine.  Interstitial cystitis (also called painful bladder syndrome). This condition develops because the tissues that line the inside of the bladder are inflamed (inflammation is the body's way of reacting to injury or infection). It causes pain and frequent   urination. It occurs in women more often than in men. DIAGNOSIS   To decide what might be causing your urinary frequency, your health care provider will probably:  Ask about symptoms you have noticed.  Ask about your overall health. This will include questions about any medications you are taking.  Do a physical examination.  Order some tests. These might include:  A blood test to check for diabetes or other health issues that could be contributing  to the problem.  Urine testing. This could measure the flow of urine and the pressure on the bladder.  A test of your neurological system (the brain, spinal cord, and nerves). This is the system that senses the need to urinate.  A bladder test to check whether it is emptying completely when you urinate.  Cystoscopy. This test uses a thin tube with a tiny camera on it. It offers a look inside your urethra and bladder to see if there are problems.  Imaging tests. You might be given a contrast dye and then asked to urinate. X-rays are taken to see how your bladder is working. TREATMENT  It is important for you to be evaluated to determine if the amount or frequency that you have is unusual or abnormal. If it is found to be abnormal, the cause should be determined and this can usually be found out easily. Depending upon the cause, treatment could include medication, stimulation of the nerves, or surgery. There are not too many things that you can do as an individual to change your urinary frequency. It is important that you balance the amount of fluid intake needed to compensate for your activity and the temperature. Medical problems will be diagnosed and taken care of by your physician. There is no particular bladder training such as Kegel exercises that you can do to help urinary frequency. This is an exercise that is usually recommended for people who have leaking of urine when they laugh, cough, or sneeze. HOME CARE INSTRUCTIONS   Take any medications your health care provider prescribed or suggested. Follow the directions carefully.  Practice any lifestyle changes that are recommended. These might include:  Drinking less fluid or drinking at different times of the day. If you need to urinate often during the night, for example, you may need to stop drinking fluids early in the evening.  Cutting down on caffeine or alcohol. They both can make you need to urinate more often than normal. Caffeine  is found in coffee, tea, and sodas.  Losing weight, if that is recommended.  Keep a journal or a log. You might be asked to record how much you drink and when and where you feel the need to urinate. This will also help evaluate how well the treatment provided by your physician is working. SEEK MEDICAL CARE IF:   Your need to urinate often gets worse.  You feel increased pain or irritation when you urinate.  You notice blood in your urine.  You have questions about any medications that your health care provider recommended.  You notice blood, pus, or swelling at the site of any test or treatment procedure.  You develop a fever of more than 100.5F (38.1C). SEEK IMMEDIATE MEDICAL CARE IF:  You develop a fever of more than 102.0F (38.9C). Document Released: 12/05/2008 Document Revised: 06/25/2013 Document Reviewed: 12/05/2008 [employer] Patient Information 2015 [employer]. This information is not intended to replace advice given to you by your health care provider. Make sure you discuss any questions you   have with your health care provider. Benign Prostatic Hypertrophy The prostate gland is part of the reproductive system of men. A normal prostate is about the size and shape of a walnut. The prostate gland produces a fluid that is mixed with sperm to make semen. This gland surrounds the urethra and is located in front of the rectum and just below the bladder. The bladder is where urine is stored. The urethra is the tube through which urine passes from the bladder to get out of the body. The prostate grows as a man ages. An enlarged prostate not caused by cancer is called benign prostatic hypertrophy (BPH). An enlarged prostate can press on the urethra. This can make it harder to pass urine. In the early stages of enlargement, the bladder can get by with a narrowed urethra by forcing the urine through. If the problem gets worse, medical or surgical treatment may be required.  This  condition should be followed by your health care provider. The accumulation of urine in the bladder can cause infection. Back pressure and infection can progress to bladder damage and kidney (renal) failure. If needed, your health care provider may refer you to a specialist in kidney and prostate disease (urologist). CAUSES  BPH is a common health problem in men older than 50 years. This condition is a normal part of aging. However, not all men will develop problems from this condition. If the enlargement grows away from the urethra, then there will not be any compression of the urethra and resistance to urine flow.If the growth is toward the urethra and compresses it, you will experience difficulty urinating.  SYMPTOMS   Not able to completely empty your bladder.  Getting up often during the night to urinate.  Need to urinate frequently during the day.  Difficultly starting urine flow.  Decrease in size and strength of your urine stream.  Dribbling after urination.  Pain on urination (more common with infection).  Inability to pass urine. This needs immediate treatment.  The development of a urinary tract infection. DIAGNOSIS  These tests will help your health care provider understand your problem:  A thorough history and physical examination.  A urination history, with the number of times you urinate, the amounts of urine, the strength of the urine stream, and the feeling of emptiness or fullness after urinating.  A postvoid bladder scan that measures any amount of urine that may remain in your bladder after you finish urinating.  Digital rectal exam. In a rectal exam, your health care provider checks your prostate by putting a gloved, lubricated finger into your rectum to feel the back of your prostate gland. This exam detects the size of your gland and abnormal lumps or growths.  Exam of your urine (urinalysis).  Prostate specific antigen (PSA) screening. This is a blood test  used to screen for prostate cancer.  Rectal ultrasonography. This test uses sound waves to electronically produce a picture of your prostate gland. TREATMENT  Once symptoms begin, your health care provider will monitor your condition. Of the men with this condition, one third will have symptoms that stabilize, one third will have symptoms that improve, and one third will have symptoms that progress in the first year. Mild symptoms may not need treatment. Simple observation and yearly exams may be all that is required. Medicines and surgery are options for more severe problems. Your health care provider can help you make an informed decision for what is best. Two classes of medicines are available  for relief of prostate symptoms:  Medicines that shrink the prostate. This helps relieve symptoms. These medicines take time to work, and it may be months before any improvement is seen.  Uncommon side effects include problems with sexual function.  Medicines to relax the muscle of the prostate. This also relieves the obstruction by reducing any compression on the urethra.This group of medicines work much faster than those that reduce the size of the prostate gland. Usually, one can experience improvement in days to weeks..  Side effects can include dizziness, fatigue, lightheadedness, and retrograde ejaculation (diminished volume of ejaculate). Several types of surgical treatments are available for relief of prostate symptoms:  Transurethral resection of the prostate (TURP)--In this treatment, an instrument is inserted through opening at the tip of the penis. It is used to cut away pieces of the inner core of the prostate. The pieces are removed through the same opening of the penis. This removes the obstruction and helps get rid of the symptoms.  Transurethral incision (TUIP)--In this procedure, small cuts are made in the prostate. This lessens the prostates pressure on the urethra.  Transurethral  microwave thermotherapy (TUMT)--This procedure uses microwaves to create heat. The heat destroys and removes a small amount of prostate tissue.  Transurethral needle ablation (TUNA)--This is a procedure that uses radio frequencies to do the same as TUMT.  Interstitial laser coagulation (ILC)--This is a procedure that uses a laser to do the same as TUMT and TUNA.  Transurethral electrovaporization (TUVP)--This is a procedure that uses electrodes to do the same as the procedures listed above. SEEK MEDICAL CARE IF:   You develop a fever.  There is unexplained back pain.  Symptoms are not helped by medicines prescribed.  You develop side effects from the medicine you are taking.  Your urine becomes very dark or has a bad smell.  Your lower abdomen becomes distended and you have difficulty passing your urine. SEEK IMMEDIATE MEDICAL CARE IF:   You are suddenly unable to urinate. This is an emergency. You should be seen immediately.  There are large amounts of blood or clots in the urine.  Your urinary problems become unmanageable.  You develop lightheadedness, severe dizziness, or you feel faint.  You develop moderate to severe low back or flank pain.  You develop chills or fever. Document Released: 02/08/2005 Document Revised: 02/13/2013 Document Reviewed: 08/24/2012 [facility] Patient Information 2015 [place], [place]. This information is not intended to replace advice given to you by your health care provider. Make sure you discuss any questions you have with your health care provider.

## 2014-01-04 LAB — URINALYSIS, ROUTINE W REFLEX MICROSCOPIC
BILIRUBIN URINE: NEGATIVE
Glucose, UA: >1000 mg/dL — AB
HGB URINE DIPSTICK: NEGATIVE
Ketones, ur: NEGATIVE mg/dL
Leukocytes, UA: NEGATIVE
NITRITE: NEGATIVE
PROTEIN: NEGATIVE mg/dL
Specific Gravity, Urine: 1.025 (ref 1.005–1.030)
UROBILINOGEN UA: 0.2 mg/dL (ref 0.0–1.0)
pH: 5 (ref 5.0–8.0)

## 2014-01-04 LAB — URINALYSIS, MICROSCOPIC ONLY
Bacteria, UA: NONE SEEN
CASTS: NONE SEEN
Crystals: NONE SEEN
Squamous Epithelial / LPF: NONE SEEN

## 2014-01-05 LAB — URINE CULTURE
Colony Count: NO GROWTH
Organism ID, Bacteria: NO GROWTH

## 2014-01-09 ENCOUNTER — Other Ambulatory Visit: Payer: Self-pay

## 2014-01-09 MED ORDER — METFORMIN HCL ER 500 MG PO TB24: ORAL_TABLET | ORAL | Status: DC

## 2014-01-09 MED ORDER — LEVOTHYROXINE SODIUM 150 MCG PO TABS: 150.0000 ug | ORAL_TABLET | Freq: Every morning | ORAL | Status: DC

## 2014-01-09 MED ORDER — LABETALOL HCL 100 MG PO TABS: 100.0000 mg | ORAL_TABLET | Freq: Two times a day (BID) | ORAL | Status: DC

## 2014-01-21 DIAGNOSIS — M5406 Panniculitis affecting regions of neck and back, lumbar region: Secondary | ICD-10-CM | POA: Diagnosis not present

## 2014-01-21 DIAGNOSIS — M545 Low back pain: Secondary | ICD-10-CM | POA: Diagnosis not present

## 2014-01-21 DIAGNOSIS — M9903 Segmental and somatic dysfunction of lumbar region: Secondary | ICD-10-CM | POA: Diagnosis not present

## 2014-01-21 DIAGNOSIS — M9901 Segmental and somatic dysfunction of cervical region: Secondary | ICD-10-CM | POA: Diagnosis not present

## 2014-01-21 DIAGNOSIS — M9902 Segmental and somatic dysfunction of thoracic region: Secondary | ICD-10-CM | POA: Diagnosis not present

## 2014-01-21 DIAGNOSIS — M542 Cervicalgia: Secondary | ICD-10-CM | POA: Diagnosis not present

## 2014-01-23 DIAGNOSIS — M542 Cervicalgia: Secondary | ICD-10-CM | POA: Diagnosis not present

## 2014-01-23 DIAGNOSIS — M9903 Segmental and somatic dysfunction of lumbar region: Secondary | ICD-10-CM | POA: Diagnosis not present

## 2014-01-23 DIAGNOSIS — M5406 Panniculitis affecting regions of neck and back, lumbar region: Secondary | ICD-10-CM | POA: Diagnosis not present

## 2014-01-23 DIAGNOSIS — M545 Low back pain: Secondary | ICD-10-CM | POA: Diagnosis not present

## 2014-01-23 DIAGNOSIS — M9902 Segmental and somatic dysfunction of thoracic region: Secondary | ICD-10-CM | POA: Diagnosis not present

## 2014-01-23 DIAGNOSIS — M9901 Segmental and somatic dysfunction of cervical region: Secondary | ICD-10-CM | POA: Diagnosis not present

## 2014-01-25 ENCOUNTER — Other Ambulatory Visit: Payer: Self-pay | Admitting: Physician Assistant

## 2014-01-25 DIAGNOSIS — M542 Cervicalgia: Secondary | ICD-10-CM | POA: Diagnosis not present

## 2014-01-25 DIAGNOSIS — M9902 Segmental and somatic dysfunction of thoracic region: Secondary | ICD-10-CM | POA: Diagnosis not present

## 2014-01-25 DIAGNOSIS — M5406 Panniculitis affecting regions of neck and back, lumbar region: Secondary | ICD-10-CM | POA: Diagnosis not present

## 2014-01-25 DIAGNOSIS — M9903 Segmental and somatic dysfunction of lumbar region: Secondary | ICD-10-CM | POA: Diagnosis not present

## 2014-01-25 DIAGNOSIS — M9901 Segmental and somatic dysfunction of cervical region: Secondary | ICD-10-CM | POA: Diagnosis not present

## 2014-01-25 DIAGNOSIS — M545 Low back pain: Secondary | ICD-10-CM | POA: Diagnosis not present

## 2014-01-30 DIAGNOSIS — M9902 Segmental and somatic dysfunction of thoracic region: Secondary | ICD-10-CM | POA: Diagnosis not present

## 2014-01-30 DIAGNOSIS — M545 Low back pain: Secondary | ICD-10-CM | POA: Diagnosis not present

## 2014-01-30 DIAGNOSIS — M5406 Panniculitis affecting regions of neck and back, lumbar region: Secondary | ICD-10-CM | POA: Diagnosis not present

## 2014-01-30 DIAGNOSIS — M9903 Segmental and somatic dysfunction of lumbar region: Secondary | ICD-10-CM | POA: Diagnosis not present

## 2014-01-30 DIAGNOSIS — M9901 Segmental and somatic dysfunction of cervical region: Secondary | ICD-10-CM | POA: Diagnosis not present

## 2014-01-30 DIAGNOSIS — M542 Cervicalgia: Secondary | ICD-10-CM | POA: Diagnosis not present

## 2014-01-31 DIAGNOSIS — M75121 Complete rotator cuff tear or rupture of right shoulder, not specified as traumatic: Secondary | ICD-10-CM | POA: Diagnosis not present

## 2014-02-19 ENCOUNTER — Other Ambulatory Visit: Payer: Self-pay | Admitting: Internal Medicine

## 2014-02-19 ENCOUNTER — Other Ambulatory Visit: Payer: Self-pay

## 2014-02-19 MED ORDER — BACLOFEN 10 MG PO TABS: ORAL_TABLET | ORAL | Status: DC

## 2014-02-19 NOTE — Telephone Encounter (Signed)
Spoke with patient and he is okay at leaving the baclofen as prescribed at 1/2 to 1 tablet 2-3 times daily, states he does use them as they do help but only when he needs them last RX was filled in June

## 2014-03-06 ENCOUNTER — Ambulatory Visit (INDEPENDENT_AMBULATORY_CARE_PROVIDER_SITE_OTHER): Payer: Medicare Other | Admitting: Internal Medicine

## 2014-03-06 ENCOUNTER — Encounter: Payer: Self-pay | Admitting: Internal Medicine

## 2014-03-06 VITALS — BP 142/76 | HR 60 | Temp 97.2°F | Resp 16 | Ht 69.5 in | Wt 167.2 lb

## 2014-03-06 DIAGNOSIS — E039 Hypothyroidism, unspecified: Secondary | ICD-10-CM

## 2014-03-06 DIAGNOSIS — E559 Vitamin D deficiency, unspecified: Secondary | ICD-10-CM | POA: Diagnosis not present

## 2014-03-06 DIAGNOSIS — I10 Essential (primary) hypertension: Secondary | ICD-10-CM

## 2014-03-06 DIAGNOSIS — Z79899 Other long term (current) drug therapy: Secondary | ICD-10-CM | POA: Diagnosis not present

## 2014-03-06 DIAGNOSIS — E782 Mixed hyperlipidemia: Secondary | ICD-10-CM

## 2014-03-06 DIAGNOSIS — E1029 Type 1 diabetes mellitus with other diabetic kidney complication: Secondary | ICD-10-CM | POA: Diagnosis not present

## 2014-03-06 LAB — HEPATIC FUNCTION PANEL
ALK PHOS: 45 U/L (ref 39–117)
ALT: 30 U/L (ref 0–53)
AST: 33 U/L (ref 0–37)
Albumin: 4.4 g/dL (ref 3.5–5.2)
BILIRUBIN TOTAL: 0.7 mg/dL (ref 0.2–1.2)
Bilirubin, Direct: 0.1 mg/dL (ref 0.0–0.3)
Indirect Bilirubin: 0.6 mg/dL (ref 0.2–1.2)
Total Protein: 6.6 g/dL (ref 6.0–8.3)

## 2014-03-06 LAB — CBC WITH DIFFERENTIAL/PLATELET
BASOS PCT: 1 % (ref 0–1)
Basophils Absolute: 0.1 10*3/uL (ref 0.0–0.1)
Eosinophils Absolute: 0.2 10*3/uL (ref 0.0–0.7)
Eosinophils Relative: 3 % (ref 0–5)
HCT: 42.9 % (ref 39.0–52.0)
Hemoglobin: 14.8 g/dL (ref 13.0–17.0)
Lymphocytes Relative: 35 % (ref 12–46)
Lymphs Abs: 1.8 10*3/uL (ref 0.7–4.0)
MCH: 31.4 pg (ref 26.0–34.0)
MCHC: 34.5 g/dL (ref 30.0–36.0)
MCV: 91.1 fL (ref 78.0–100.0)
MONO ABS: 0.4 10*3/uL (ref 0.1–1.0)
MPV: 8.7 fL (ref 8.6–12.4)
Monocytes Relative: 8 % (ref 3–12)
Neutro Abs: 2.7 10*3/uL (ref 1.7–7.7)
Neutrophils Relative %: 53 % (ref 43–77)
Platelets: 154 10*3/uL (ref 150–400)
RBC: 4.71 MIL/uL (ref 4.22–5.81)
RDW: 13.8 % (ref 11.5–15.5)
WBC: 5 10*3/uL (ref 4.0–10.5)

## 2014-03-06 LAB — LIPID PANEL
Cholesterol: 137 mg/dL (ref 0–200)
HDL: 41 mg/dL (ref >39)
LDL Cholesterol: 58 mg/dL (ref 0–99)
Total CHOL/HDL Ratio: 3.3 Ratio
Triglycerides: 192 mg/dL — ABNORMAL HIGH (ref <150)
VLDL: 38 mg/dL (ref 0–40)

## 2014-03-06 LAB — TSH: TSH: 2.333 u[IU]/mL (ref 0.350–4.500)

## 2014-03-06 LAB — MAGNESIUM: Magnesium: 1.8 mg/dL (ref 1.5–2.5)

## 2014-03-06 LAB — BASIC METABOLIC PANEL WITH GFR
BUN: 27 mg/dL — ABNORMAL HIGH (ref 6–23)
CO2: 28 mEq/L (ref 19–32)
Calcium: 9.8 mg/dL (ref 8.4–10.5)
Chloride: 101 mEq/L (ref 96–112)
Creat: 1.24 mg/dL (ref 0.50–1.35)
GFR, EST AFRICAN AMERICAN: 64 mL/min
GFR, EST NON AFRICAN AMERICAN: 56 mL/min — AB
GLUCOSE: 168 mg/dL — AB (ref 70–99)
POTASSIUM: 4.7 meq/L (ref 3.5–5.3)
SODIUM: 137 meq/L (ref 135–145)

## 2014-03-06 NOTE — Progress Notes (Signed)
Patient ID: Gerald [name], male   DOB: December 20, 1934, 80 y.o.   MRN: [id number]   This very nice 80 y.o.MWM presents for 3 month follow up with Hypertension, Hyperlipidemia, Pre-Diabetes and Vitamin D Deficiency.    Patient is treated for HTN & BP has been controlled at home. Today's BP: (!) 142/76 mmHg. Patient has had no complaints of any cardiac type chest pain, palpitations, dyspnea/orthopnea/PND, dizziness, claudication, or dependent edema.   Hyperlipidemia is controlled with diet & meds. Patient denies myalgias or other med SE's. Last Lipids were Total Chol 119; HDL 52; LDL  44; Trig 116 on 12/03/2013.   Also, the patient has history of T2_NIDDM w/CKD and has had no symptoms of reactive hypoglycemia, diabetic polys, paresthesias or visual blurring. FBG's usu < 130's.  Last A1c was7.0% on  12/03/2013.    Further, the patient also has history of Vitamin D Deficiency and supplements vitamin D without any suspected side-effects. Last vitamin D was  94 on  08/15/2013.    Medication List   B-D UF III MINI PEN NEEDLES 31G X 5 MM Misc  Generic drug:  Insulin Pen Needle  CHECK BLOOD SUGAR THREE TIMES DAILY FOR FLUCUATING BLOOD SUGARS     baclofen 10 MG tablet  Commonly known as:  LIORESAL  TAKE 1/2-1 TABLET BY MOUTH 2-3 TIMES A DAY AS NEEDED FOR MUSCLE SPASMS     celecoxib 200 MG capsule  Commonly known as:  CELEBREX  TAKE 1 CAPSULE BY MOUTH EVERY DAY FOR PAIN AND INFLAMMATION     finasteride 5 MG tablet  Commonly known as:  PROSCAR  TAKE 1 TABLET BY MOUTH EVERY DAY     fish oil-omega-3 fatty acids 1000 MG capsule  Take 2 g by mouth daily.     labetalol 100 MG tablet  Commonly known as:  NORMODYNE  Take 1 tablet (100 mg total) by mouth 2 (two) times daily.     LANTUS SOLOSTAR 100 UNIT/ML Solostar Pen  Generic drug:  Insulin Glargine  USE AS DIRECTED     levothyroxine 150 MCG tablet  Commonly known as:  SYNTHROID, LEVOTHROID  Take 1 tablet (150 mcg total) by mouth every morning.      losartan-hydrochlorothiazide 100-25 MG per tablet  Commonly known as:  HYZAAR  TAKE 1 TABLET BY MOUTH EVERY DAY FOR HIGH BLOOD PRESSURE     metFORMIN 500 MG 24 hr tablet  Commonly known as:  GLUCOPHAGE-XR  TAKE 4 TABLETS BY MOUTH EVERY DAY FOR DIABETES     minocycline 100 MG capsule  Commonly known as:  MINOCIN,DYNACIN  TAKE ONE CAPSULE BY MOUTH DAILY     multivitamin tablet  Take 1 tablet by mouth daily.     NEXIUM 40 MG capsule  Generic drug:  esomeprazole  TAKE 1 CAPSULE BY MOUTH EVERY DAY     simvastatin 40 MG tablet  Commonly known as:  ZOCOR  TAKE 1 TABLET BY MOUTH EVERY NIGHT AT BEDTIME FOR CHOLESTEROL     valACYclovir 500 MG tablet  Commonly known as:  VALTREX  Take 1 tablet (500 mg total) by mouth 2 (two) times daily. For Fever Blisters     VITAMIN D PO  Take 5,000 Units by mouth daily.     ZETIA 10 MG tablet  Generic drug:  ezetimibe  TAKE 1 TABLET BY MOUTH DAILY     Allergies  Allergen Reactions  . Ace Inhibitors   . Beta Adrenergic Blockers    PMHx:   Past Medical  History  Diagnosis Date  . Hypertension   . Hyperlipidemia   . Arthritis of shoulder BILATERAL  . GERD (gastroesophageal reflux disease)   . BPH (benign prostatic hypertrophy)   . Hyperthyroidism   . Rotator cuff tear, right RECURRENT  . Diabetes mellitus ORAL MED AND INSULIN  . Vitamin D deficiency    Immunization History  Administered Date(s) Administered  . Influenza Whole 11/10/2012  . Influenza, High Dose Seasonal PF 12/03/2013  . Pneumococcal Conjugate-13 12/03/2013  . Pneumococcal Polysaccharide-23 07/27/2011  . Tdap 02/23/2004  . Zoster 07/15/2005   Past Surgical History  Procedure Laterality Date  . Right shoulder arthroscopy/ labral & biceps tendon debridement/ distal clavicle decompression/ sad/ mini open rotator cuff repair  03-09-2010  . Shoulder arthroscopy  04-10-2004    LEFT  . Lumbar microdiscectomy  04-06-1999    L4 - 5  . Shoulder open rotator cuff  repair  06/09/2011    Procedure: ROTATOR CUFF REPAIR SHOULDER OPEN;  Surgeon: Drucilla Schmidt, MD;  Location: [facility] ;  Service: Orthopedics;  Laterality: Right;  anterior acromionectomy repair right rotator cuff with tissue mend    FHx:    Reviewed / unchanged  SHx:    Reviewed / unchanged  Systems Review:  Constitutional: Denies fever, chills, wt changes, headaches, insomnia, fatigue, night sweats, change in appetite. Eyes: Denies redness, blurred vision, diplopia, discharge, itchy, watery eyes.  ENT: Denies discharge, congestion, post nasal drip, epistaxis, sore throat, earache, hearing loss, dental pain, tinnitus, vertigo, sinus pain, snoring.  CV: Denies chest pain, palpitations, irregular heartbeat, syncope, dyspnea, diaphoresis, orthopnea, PND, claudication or edema. Respiratory: denies cough, dyspnea, DOE, pleurisy, hoarseness, laryngitis, wheezing.  Gastrointestinal: Denies dysphagia, odynophagia, heartburn, reflux, water brash, abdominal pain or cramps, nausea, vomiting, bloating, diarrhea, constipation, hematemesis, melena, hematochezia  or hemorrhoids. Genitourinary: Denies dysuria, frequency, urgency, nocturia, hesitancy, discharge, hematuria or flank pain. Musculoskeletal: Denies arthralgias, myalgias, stiffness, jt. swelling, pain, limping or strain/sprain.  Skin: Denies pruritus, rash, hives, warts, acne, eczema or change in skin lesion(s). Neuro: No weakness, tremor, incoordination, spasms, paresthesia or pain. Psychiatric: Denies confusion, memory loss or sensory loss. Endo: Denies change in weight, skin or hair change.  Heme/Lymph: No excessive bleeding, bruising or enlarged lymph nodes.  Physical Exam  BP 142/76   Pulse 60  Temp 97.2 F   Resp 16  Ht 5' 9.5"  Wt 167 lb 3.2 oz    BMI 24.35   Appears well nourished and in no distress. Eyes: PERRLA, EOMs, conjunctiva no swelling or erythema. Sinuses: No frontal/maxillary tenderness ENT/Mouth:  EAC's clear, TM's nl w/o erythema, bulging. Nares clear w/o erythema, swelling, exudates. Oropharynx clear without erythema or exudates. Oral hygiene is good. Tongue normal, non obstructing. Hearing intact.  Neck: Supple. Thyroid nl. Car 2+/2+ without bruits, nodes or JVD. Chest: Respirations nl with BS clear & equal w/o rales, rhonchi, wheezing or stridor.  Cor: Heart sounds normal w/ regular rate and rhythm without sig. murmurs, gallops, clicks, or rubs. Peripheral pulses normal and equal  without edema.  Abdomen: Soft & bowel sounds normal. Non-tender w/o guarding, rebound, hernias, masses, or organomegaly.  Lymphatics: Unremarkable.  Musculoskeletal: Full ROM all peripheral extremities, joint stability, 5/5 strength, and normal gait.  Skin: Warm, dry without exposed rashes, lesions or ecchymosis apparent.  Neuro: Cranial nerves intact, reflexes equal bilaterally. Sensory-motor testing grossly intact. Tendon reflexes grossly intact.  Pysch: Alert & oriented x 3.  Insight and judgement nl & appropriate. No ideations.  Assessment and Plan:  1. Hypertension - Continue monitor blood pressure at home. Continue diet/meds same.  2. Hyperlipidemia - Continue diet/meds, exercise,& lifestyle modifications. Continue monitor periodic cholesterol/liver & renal functions   3. T1_IDDM w/CKD - Continue diet, exercise, lifestyle modifications. Monitor appropriate labs.  4. Vitamin D Deficiency - Continue supplementation.   Recommended regular exercise, BP monitoring, weight control, and discussed med and SE's. Recommended labs to assess and monitor clinical status. Further disposition pending results of labs.

## 2014-03-06 NOTE — Patient Instructions (Signed)

## 2014-03-07 LAB — HEMOGLOBIN A1C
Hgb A1c MFr Bld: 6.8 % — ABNORMAL HIGH (ref <5.7)
Mean Plasma Glucose: 148 mg/dL — ABNORMAL HIGH (ref <117)

## 2014-03-07 LAB — VITAMIN D 25 HYDROXY (VIT D DEFICIENCY, FRACTURES): Vit D, 25-Hydroxy: 75 ng/mL (ref 30–100)

## 2014-03-19 ENCOUNTER — Other Ambulatory Visit: Payer: Self-pay | Admitting: Internal Medicine

## 2014-03-26 ENCOUNTER — Other Ambulatory Visit: Payer: Self-pay | Admitting: Physician Assistant

## 2014-04-14 ENCOUNTER — Other Ambulatory Visit: Payer: Self-pay | Admitting: Internal Medicine

## 2014-04-25 ENCOUNTER — Other Ambulatory Visit: Payer: Self-pay | Admitting: Internal Medicine

## 2014-05-01 ENCOUNTER — Encounter: Payer: Self-pay | Admitting: *Deleted

## 2014-05-09 ENCOUNTER — Other Ambulatory Visit: Payer: Self-pay | Admitting: Internal Medicine

## 2014-05-16 ENCOUNTER — Other Ambulatory Visit: Payer: Self-pay | Admitting: *Deleted

## 2014-05-16 ENCOUNTER — Other Ambulatory Visit: Payer: Self-pay | Admitting: Internal Medicine

## 2014-05-16 MED ORDER — NEXIUM 40 MG PO CPDR: DELAYED_RELEASE_CAPSULE | ORAL | Status: DC

## 2014-05-22 ENCOUNTER — Other Ambulatory Visit: Payer: Self-pay | Admitting: *Deleted

## 2014-05-22 MED ORDER — NEXIUM 40 MG PO CPDR: DELAYED_RELEASE_CAPSULE | ORAL | Status: DC

## 2014-06-10 ENCOUNTER — Other Ambulatory Visit: Payer: Self-pay | Admitting: *Deleted

## 2014-06-10 MED ORDER — PANTOPRAZOLE SODIUM 40 MG PO TBEC: 40.0000 mg | DELAYED_RELEASE_TABLET | Freq: Every day | ORAL | Status: DC

## 2014-06-10 NOTE — Telephone Encounter (Signed)
Nexium rx will no longer be covered by patient's insurance.  Per Dr. Kathryne SharperMcKeown's orders, rx was changed to Protonix 40 mg QD.

## 2014-06-17 DIAGNOSIS — H3531 Nonexudative age-related macular degeneration: Secondary | ICD-10-CM | POA: Diagnosis not present

## 2014-06-17 DIAGNOSIS — E119 Type 2 diabetes mellitus without complications: Secondary | ICD-10-CM | POA: Diagnosis not present

## 2014-06-17 DIAGNOSIS — H2513 Age-related nuclear cataract, bilateral: Secondary | ICD-10-CM | POA: Diagnosis not present

## 2014-07-01 ENCOUNTER — Ambulatory Visit: Payer: Self-pay | Admitting: Physician Assistant

## 2014-07-02 ENCOUNTER — Ambulatory Visit: Payer: Self-pay | Admitting: Internal Medicine

## 2014-07-03 ENCOUNTER — Encounter: Payer: Self-pay | Admitting: Internal Medicine

## 2014-07-03 ENCOUNTER — Ambulatory Visit (INDEPENDENT_AMBULATORY_CARE_PROVIDER_SITE_OTHER): Payer: Medicare Other | Admitting: Internal Medicine

## 2014-07-03 VITALS — BP 128/68 | HR 56 | Temp 97.7°F | Resp 16 | Ht 69.5 in | Wt 164.0 lb

## 2014-07-03 DIAGNOSIS — E1129 Type 2 diabetes mellitus with other diabetic kidney complication: Secondary | ICD-10-CM | POA: Diagnosis not present

## 2014-07-03 DIAGNOSIS — E1122 Type 2 diabetes mellitus with diabetic chronic kidney disease: Secondary | ICD-10-CM

## 2014-07-03 DIAGNOSIS — E039 Hypothyroidism, unspecified: Secondary | ICD-10-CM

## 2014-07-03 DIAGNOSIS — N182 Chronic kidney disease, stage 2 (mild): Secondary | ICD-10-CM

## 2014-07-03 DIAGNOSIS — Z79899 Other long term (current) drug therapy: Secondary | ICD-10-CM | POA: Diagnosis not present

## 2014-07-03 DIAGNOSIS — E559 Vitamin D deficiency, unspecified: Secondary | ICD-10-CM

## 2014-07-03 DIAGNOSIS — I10 Essential (primary) hypertension: Secondary | ICD-10-CM

## 2014-07-03 DIAGNOSIS — E1029 Type 1 diabetes mellitus with other diabetic kidney complication: Secondary | ICD-10-CM | POA: Diagnosis not present

## 2014-07-03 DIAGNOSIS — E782 Mixed hyperlipidemia: Secondary | ICD-10-CM | POA: Diagnosis not present

## 2014-07-03 LAB — HEPATIC FUNCTION PANEL
ALT: 28 U/L (ref 0–53)
AST: 33 U/L (ref 0–37)
Albumin: 4.2 g/dL (ref 3.5–5.2)
Alkaline Phosphatase: 39 U/L (ref 39–117)
BILIRUBIN DIRECT: 0.2 mg/dL (ref 0.0–0.3)
Indirect Bilirubin: 0.5 mg/dL (ref 0.2–1.2)
TOTAL PROTEIN: 6.1 g/dL (ref 6.0–8.3)
Total Bilirubin: 0.7 mg/dL (ref 0.2–1.2)

## 2014-07-03 LAB — LIPID PANEL
Cholesterol: 129 mg/dL (ref 0–200)
HDL: 49 mg/dL (ref >=40)
LDL CALC: 63 mg/dL (ref 0–99)
Total CHOL/HDL Ratio: 2.6 Ratio
Triglycerides: 84 mg/dL (ref <150)
VLDL: 17 mg/dL (ref 0–40)

## 2014-07-03 LAB — BASIC METABOLIC PANEL WITH GFR
BUN: 24 mg/dL — ABNORMAL HIGH (ref 6–23)
CO2: 24 mEq/L (ref 19–32)
Calcium: 9.2 mg/dL (ref 8.4–10.5)
Chloride: 107 mEq/L (ref 96–112)
Creat: 1.13 mg/dL (ref 0.50–1.35)
GFR, EST AFRICAN AMERICAN: 72 mL/min
GFR, EST NON AFRICAN AMERICAN: 62 mL/min
Glucose, Bld: 82 mg/dL (ref 70–99)
Potassium: 4.4 mEq/L (ref 3.5–5.3)
SODIUM: 139 meq/L (ref 135–145)

## 2014-07-03 LAB — CBC WITH DIFFERENTIAL/PLATELET
BASOS ABS: 0.1 10*3/uL (ref 0.0–0.1)
BASOS PCT: 1 % (ref 0–1)
EOS ABS: 0.3 10*3/uL (ref 0.0–0.7)
EOS PCT: 5 % (ref 0–5)
HCT: 39.5 % (ref 39.0–52.0)
Hemoglobin: 13.4 g/dL (ref 13.0–17.0)
Lymphocytes Relative: 31 % (ref 12–46)
Lymphs Abs: 1.7 10*3/uL (ref 0.7–4.0)
MCH: 30.7 pg (ref 26.0–34.0)
MCHC: 33.9 g/dL (ref 30.0–36.0)
MCV: 90.6 fL (ref 78.0–100.0)
MONO ABS: 0.5 10*3/uL (ref 0.1–1.0)
MPV: 8.7 fL (ref 8.6–12.4)
Monocytes Relative: 9 % (ref 3–12)
Neutro Abs: 2.9 10*3/uL (ref 1.7–7.7)
Neutrophils Relative %: 54 % (ref 43–77)
PLATELETS: 152 10*3/uL (ref 150–400)
RBC: 4.36 MIL/uL (ref 4.22–5.81)
RDW: 14.5 % (ref 11.5–15.5)
WBC: 5.4 10*3/uL (ref 4.0–10.5)

## 2014-07-03 LAB — MAGNESIUM: Magnesium: 1.8 mg/dL (ref 1.5–2.5)

## 2014-07-03 LAB — HEMOGLOBIN A1C
Hgb A1c MFr Bld: 6.5 % — ABNORMAL HIGH (ref <5.7)
Mean Plasma Glucose: 140 mg/dL — ABNORMAL HIGH (ref <117)

## 2014-07-03 LAB — TSH: TSH: 1.097 u[IU]/mL (ref 0.350–4.500)

## 2014-07-03 NOTE — Progress Notes (Signed)
Patient ID: Gerald [name], male   DOB: 11/05/1934, 80 y.o.   MRN: [id number]  Assessment and Plan:  Hypertension:  -Continue medication -monitor blood pressure at home. -Continue DASH diet -Reminder to go to the ER if any CP, SOB, nausea, dizziness, severe HA, changes vision/speech, left arm numbness and tingling and jaw pain.  Cholesterol - Continue diet and exercise -Check cholesterol.   Diabetes with diabetic chronic kidney disease -Continue diet and exercise.  -Check A1C  Vitamin D Def -check level -continue medications.   Genella RifeGerd -transition of protonix to pepcid or zantac  Continue diet and meds as discussed. Further disposition pending results of labs. Discussed med's effects and SE's.    HPI 80 y.o. male  presents for 3 month follow up with hypertension, hyperlipidemia, diabetes and vitamin D deficiency.   His blood pressure has been controlled at home, today their BP is BP: 128/68 mmHg.He does not workout. He does do some golfing and also does some playiing with his grandchildren.   He denies chest pain, shortness of breath, dizziness.   He is on cholesterol medication and denies myalgias. His cholesterol is at goal. The cholesterol was:  03/06/2014: Cholesterol, Total 137; HDL-C 41; LDL (calc) 58; Triglycerides 192*   He has been working on diet and exercise for diabetes with diabetic chronic kidney disease, he is on bASA, he is on ACE/ARB, and denies  foot ulcerations, hyperglycemia, hypoglycemia , increased appetite, nausea, paresthesia of the feet, polydipsia, polyuria, visual disturbances, vomiting and weight loss. Last A1C was: 03/06/2014: Hemoglobin-A1c 6.8*   Patient is on Vitamin D supplement. 03/06/2014: VITD 75    Current Medications:  Current Outpatient Prescriptions on File Prior to Visit  Medication Sig Dispense Refill  . aspirin 81 MG tablet Take by mouth daily.     . B-D UF III MINI PEN NEEDLES 31G X 5 MM MISC CHECK BLOOD SUGAR THREE TIMES DAILY FOR  FLUCTUATING BLOOD SUGARS 100 each 99  . baclofen (LIORESAL) 10 MG tablet TAKE 1/2-1 TABLET BY MOUTH 2-3 TIMES A DAY AS NEEDED FOR MUSCLE SPASMS 60 tablet 3  . celecoxib (CELEBREX) 200 MG capsule TAKE 1 CAPSULE BY MOUTH EVERY DAY FOR PAIN AND INFLAMMATION 90 capsule 99  . Cholecalciferol (VITAMIN D PO) Take 5,000 Units by mouth daily.    . finasteride (PROSCAR) 5 MG tablet TAKE 1 TABLET BY MOUTH EVERY DAY 90 tablet 5  . fish oil-omega-3 fatty acids 1000 MG capsule Take 1 g by mouth daily.     Marland Kitchen. labetalol (NORMODYNE) 100 MG tablet Take 1 tablet (100 mg total) by mouth 2 (two) times daily. 180 tablet 3  . LANTUS SOLOSTAR 100 UNIT/ML Solostar Pen USE AS DIRECTED 15 mL 99  . levothyroxine (SYNTHROID, LEVOTHROID) 150 MCG tablet Take 1 tablet (150 mcg total) by mouth every morning. 90 tablet 3  . losartan-hydrochlorothiazide (HYZAAR) 100-25 MG per tablet TAKE 1 TABLET BY MOUTH EVERY DAY FOR HIGH BLOOD PRESSURE 30 tablet PRN  . metFORMIN (GLUCOPHAGE-XR) 500 MG 24 hr tablet TAKE 4 TABLETS BY MOUTH EVERY DAY FOR DIABETES 360 tablet 3  . minocycline (MINOCIN,DYNACIN) 100 MG capsule TAKE ONE CAPSULE BY MOUTH DAILY 90 capsule 3  . Multiple Vitamin (MULTIVITAMIN) tablet Take 1 tablet by mouth daily.    . pantoprazole (PROTONIX) 40 MG tablet Take 1 tablet (40 mg total) by mouth daily. 90 tablet 1  . simvastatin (ZOCOR) 40 MG tablet TAKE 1 TABLET BY MOUTH EVERY NIGHT AT BEDTIME FOR CHOLESTEROL 90 tablet PRN  .  valACYclovir (VALTREX) 500 MG tablet Take 1 tablet (500 mg total) by mouth 2 (two) times daily. For Fever Blisters 60 tablet 99  . ZETIA 10 MG tablet TAKE 1 TABLET BY MOUTH EVERY DAY 30 tablet 99   No current facility-administered medications on file prior to visit.   Medical History:  Past Medical History  Diagnosis Date  . Hypertension   . Hyperlipidemia   . Arthritis of shoulder BILATERAL  . GERD (gastroesophageal reflux disease)   . BPH (benign prostatic hypertrophy)   . Hyperthyroidism   .  Rotator cuff tear, right RECURRENT  . Diabetes mellitus ORAL MED AND INSULIN  . Vitamin D deficiency    Allergies:  Allergies  Allergen Reactions  . Ace Inhibitors   . Beta Adrenergic Blockers      Review of Systems:  Review of Systems  Constitutional: Negative for fever, chills and malaise/fatigue.  HENT: Negative for congestion, ear discharge, sore throat and tinnitus.   Eyes: Negative.   Respiratory: Negative for cough, shortness of breath and wheezing.   Cardiovascular: Negative for chest pain, palpitations and leg swelling.  Gastrointestinal: Negative for heartburn, nausea, vomiting, diarrhea, constipation, blood in stool and melena.  Genitourinary: Positive for frequency. Negative for dysuria, urgency and hematuria.  Skin: Negative.   Neurological: Negative for dizziness, sensory change, loss of consciousness and headaches.  Psychiatric/Behavioral: Negative for depression. The patient is not nervous/anxious and does not have insomnia.     Family history- Review and unchanged  Social history- Review and unchanged  Physical Exam: BP 128/68 mmHg  Pulse 56  Temp(Src) 97.7 F (36.5 C)  Resp 16  Ht 5' 9.5" (1.765 m)  Wt 164 lb (74.39 kg)  BMI 23.88 kg/m2 Wt Readings from Last 3 Encounters:  07/03/14 164 lb (74.39 kg)  03/06/14 167 lb 3.2 oz (75.841 kg)  01/03/14 164 lb (74.39 kg)   General Appearance: Well nourished well developed, non-toxic appearing, in no apparent distress. Eyes: PERRLA, EOMs, conjunctiva no swelling or erythema ENT/Mouth: Ear canals clear with no erythema, swelling, or discharge.  TMs normal bilaterally, oropharynx clear, moist, with no exudate.   Neck: Supple, thyroid normal, no JVD, no cervical adenopathy.  Respiratory: Respiratory effort normal, breath sounds clear A&P, no wheeze, rhonchi or rales noted.  No retractions, no accessory muscle usage Cardio: RRR with no MRGs. No noted edema.  Abdomen: Soft, + BS.  Non tender, no guarding, rebound,  hernias, masses. Musculoskeletal: Full ROM, 5/5 strength, Normal gait Skin: Warm, dry without rashes, lesions, ecchymosis.  Neuro: Awake and oriented X 3, Cranial nerves intact. No cerebellar symptoms.  Psych: normal affect, Insight and Judgment appropriate.    FORCUCCI, Deniyah Dillavou, PA-C 9:32 AM [facility]

## 2014-07-03 NOTE — Patient Instructions (Signed)
GETTING OFF OF PPI's    Nexium/protonix/prilosec/Omeprazole/Dexilant/Aciphex are called PPI's, they are great at healing your stomach but should only be taken for a short period of time.     Recent studies have shown that taken for a long time they  can increase the risk of osteoporosis (weakening of your bones), pneumonia, low magnesium, restless legs, Cdiff (infection that causes diarrhea), DEMENTIA and most recently kidney damage / disease / insufficiency.     Due to this information we want to try to stop the PPI but if you try to stop it abruptly this can cause rebound acid and worsening symptoms.   So this is how we want you to get off the PPI:  - Start taking the nexium/protonix/prilosec/PPI  every other day with  zantac (ranitidine) 2 x a day for 2-4 weeks  - then decrease the PPI to every 3 days while taking the zantac (ranitidine) twice a day the other  days for 2-4  Weeks  - then you can try the zantac (ranitidine) once at night or up to 2 x day as needed.  - you can continue on this once at night or stop all together  - Avoid alcohol, spicy foods, NSAIDS (aleve, ibuprofen) at this time. See foods below.   +++++++++++++++++++++++++++++++++++++++++++  Food Choices for Gastroesophageal Reflux Disease  When you have gastroesophageal reflux disease (GERD), the foods you eat and your eating habits are very important. Choosing the right foods can help ease the discomfort of GERD. WHAT GENERAL GUIDELINES DO I NEED TO FOLLOW?  Choose fruits, vegetables, whole grains, low-fat dairy products, and low-fat meat, fish, and poultry.  Limit fats such as oils, salad dressings, butter, nuts, and avocado.  Keep a food diary to identify foods that cause symptoms.  Avoid foods that cause reflux. These may be different for different people.  Eat frequent small meals instead of three large meals each day.  Eat your meals slowly, in a relaxed setting.  Limit fried foods.  Cook foods  using methods other than frying.  Avoid drinking alcohol.  Avoid drinking large amounts of liquids with your meals.  Avoid bending over or lying down until 2-3 hours after eating.   WHAT FOODS ARE NOT RECOMMENDED? The following are some foods and drinks that may worsen your symptoms:  Vegetables Tomatoes. Tomato juice. Tomato and spaghetti sauce. Chili peppers. Onion and garlic. Horseradish. Fruits Oranges, grapefruit, and lemon (fruit and juice). Meats High-fat meats, fish, and poultry. This includes hot dogs, ribs, ham, sausage, salami, and bacon. Dairy Whole milk and chocolate milk. Sour cream. Cream. Butter. Ice cream. Cream cheese.  Beverages Coffee and tea, with or without caffeine. Carbonated beverages or energy drinks. Condiments Hot sauce. Barbecue sauce.  Sweets/Desserts Chocolate and cocoa. Donuts. Peppermint and spearmint. Fats and Oils High-fat foods, including French fries and potato chips. Other Vinegar. Strong spices, such as black pepper, white pepper, red pepper, cayenne, curry powder, cloves, ginger, and chili powder. Nexium/protonix/prilosec are called PPI's, they are great at healing your stomach but should only be taken for a short period of time.    

## 2014-07-04 LAB — INSULIN, RANDOM: Insulin: 42.5 u[IU]/mL — ABNORMAL HIGH (ref 2.0–19.6)

## 2014-07-14 ENCOUNTER — Encounter: Payer: Self-pay | Admitting: *Deleted

## 2014-07-25 DIAGNOSIS — I10 Essential (primary) hypertension: Secondary | ICD-10-CM | POA: Diagnosis not present

## 2014-07-25 DIAGNOSIS — Z87891 Personal history of nicotine dependence: Secondary | ICD-10-CM | POA: Diagnosis not present

## 2014-07-25 DIAGNOSIS — E039 Hypothyroidism, unspecified: Secondary | ICD-10-CM | POA: Diagnosis not present

## 2014-07-25 DIAGNOSIS — M75121 Complete rotator cuff tear or rupture of right shoulder, not specified as traumatic: Secondary | ICD-10-CM | POA: Diagnosis not present

## 2014-07-25 DIAGNOSIS — Z794 Long term (current) use of insulin: Secondary | ICD-10-CM | POA: Diagnosis not present

## 2014-07-25 DIAGNOSIS — M75101 Unspecified rotator cuff tear or rupture of right shoulder, not specified as traumatic: Secondary | ICD-10-CM | POA: Diagnosis not present

## 2014-07-25 DIAGNOSIS — E785 Hyperlipidemia, unspecified: Secondary | ICD-10-CM | POA: Diagnosis not present

## 2014-07-25 DIAGNOSIS — E119 Type 2 diabetes mellitus without complications: Secondary | ICD-10-CM | POA: Diagnosis not present

## 2014-08-09 ENCOUNTER — Other Ambulatory Visit: Payer: Self-pay | Admitting: *Deleted

## 2014-08-09 MED ORDER — INSULIN PEN NEEDLE 32G X 4 MM MISC: Status: DC

## 2014-08-28 ENCOUNTER — Encounter: Payer: Self-pay | Admitting: Internal Medicine

## 2014-09-05 ENCOUNTER — Other Ambulatory Visit: Payer: Self-pay | Admitting: Internal Medicine

## 2014-09-06 DIAGNOSIS — M9903 Segmental and somatic dysfunction of lumbar region: Secondary | ICD-10-CM | POA: Diagnosis not present

## 2014-09-06 DIAGNOSIS — M545 Low back pain: Secondary | ICD-10-CM | POA: Diagnosis not present

## 2014-09-09 DIAGNOSIS — M9903 Segmental and somatic dysfunction of lumbar region: Secondary | ICD-10-CM | POA: Diagnosis not present

## 2014-09-09 DIAGNOSIS — M545 Low back pain: Secondary | ICD-10-CM | POA: Diagnosis not present

## 2014-09-11 DIAGNOSIS — M545 Low back pain: Secondary | ICD-10-CM | POA: Diagnosis not present

## 2014-09-11 DIAGNOSIS — M9903 Segmental and somatic dysfunction of lumbar region: Secondary | ICD-10-CM | POA: Diagnosis not present

## 2014-09-16 ENCOUNTER — Other Ambulatory Visit: Payer: Self-pay | Admitting: Internal Medicine

## 2014-09-28 ENCOUNTER — Other Ambulatory Visit: Payer: Self-pay | Admitting: Internal Medicine

## 2014-09-28 DIAGNOSIS — E119 Type 2 diabetes mellitus without complications: Secondary | ICD-10-CM

## 2014-10-22 ENCOUNTER — Encounter: Payer: Self-pay | Admitting: Internal Medicine

## 2014-11-01 ENCOUNTER — Ambulatory Visit (INDEPENDENT_AMBULATORY_CARE_PROVIDER_SITE_OTHER): Payer: Medicare Other | Admitting: Internal Medicine

## 2014-11-01 ENCOUNTER — Encounter: Payer: Self-pay | Admitting: Internal Medicine

## 2014-11-01 VITALS — BP 126/76 | HR 64 | Temp 97.7°F | Resp 16 | Ht 69.25 in | Wt 161.2 lb

## 2014-11-01 DIAGNOSIS — Z23 Encounter for immunization: Secondary | ICD-10-CM | POA: Diagnosis not present

## 2014-11-01 DIAGNOSIS — N182 Chronic kidney disease, stage 2 (mild): Secondary | ICD-10-CM

## 2014-11-01 DIAGNOSIS — E782 Mixed hyperlipidemia: Secondary | ICD-10-CM

## 2014-11-01 DIAGNOSIS — E559 Vitamin D deficiency, unspecified: Secondary | ICD-10-CM | POA: Diagnosis not present

## 2014-11-01 DIAGNOSIS — E039 Hypothyroidism, unspecified: Secondary | ICD-10-CM

## 2014-11-01 DIAGNOSIS — N320 Bladder-neck obstruction: Secondary | ICD-10-CM | POA: Diagnosis not present

## 2014-11-01 DIAGNOSIS — N400 Enlarged prostate without lower urinary tract symptoms: Secondary | ICD-10-CM | POA: Diagnosis not present

## 2014-11-01 DIAGNOSIS — Z0001 Encounter for general adult medical examination with abnormal findings: Secondary | ICD-10-CM

## 2014-11-01 DIAGNOSIS — K219 Gastro-esophageal reflux disease without esophagitis: Secondary | ICD-10-CM | POA: Diagnosis not present

## 2014-11-01 DIAGNOSIS — Z794 Long term (current) use of insulin: Secondary | ICD-10-CM

## 2014-11-01 DIAGNOSIS — Z9181 History of falling: Secondary | ICD-10-CM

## 2014-11-01 DIAGNOSIS — M159 Polyosteoarthritis, unspecified: Secondary | ICD-10-CM

## 2014-11-01 DIAGNOSIS — E1122 Type 2 diabetes mellitus with diabetic chronic kidney disease: Secondary | ICD-10-CM

## 2014-11-01 DIAGNOSIS — Z789 Other specified health status: Secondary | ICD-10-CM | POA: Diagnosis not present

## 2014-11-01 DIAGNOSIS — Z6823 Body mass index (BMI) 23.0-23.9, adult: Secondary | ICD-10-CM

## 2014-11-01 DIAGNOSIS — I10 Essential (primary) hypertension: Secondary | ICD-10-CM | POA: Diagnosis not present

## 2014-11-01 DIAGNOSIS — Z1389 Encounter for screening for other disorder: Secondary | ICD-10-CM

## 2014-11-01 DIAGNOSIS — R6889 Other general symptoms and signs: Secondary | ICD-10-CM | POA: Diagnosis not present

## 2014-11-01 DIAGNOSIS — M1990 Unspecified osteoarthritis, unspecified site: Secondary | ICD-10-CM | POA: Insufficient documentation

## 2014-11-01 DIAGNOSIS — Z0000 Encounter for general adult medical examination without abnormal findings: Secondary | ICD-10-CM | POA: Diagnosis not present

## 2014-11-01 DIAGNOSIS — Z1212 Encounter for screening for malignant neoplasm of rectum: Secondary | ICD-10-CM

## 2014-11-01 DIAGNOSIS — Z1331 Encounter for screening for depression: Secondary | ICD-10-CM

## 2014-11-01 DIAGNOSIS — E119 Type 2 diabetes mellitus without complications: Secondary | ICD-10-CM

## 2014-11-01 DIAGNOSIS — M150 Primary generalized (osteo)arthritis: Secondary | ICD-10-CM | POA: Diagnosis not present

## 2014-11-01 DIAGNOSIS — Z125 Encounter for screening for malignant neoplasm of prostate: Secondary | ICD-10-CM

## 2014-11-01 DIAGNOSIS — Z79899 Other long term (current) drug therapy: Secondary | ICD-10-CM

## 2014-11-01 LAB — LIPID PANEL
CHOLESTEROL: 152 mg/dL (ref 125–200)
HDL: 46 mg/dL (ref >=40)
LDL CALC: 71 mg/dL (ref <130)
Total CHOL/HDL Ratio: 3.3 Ratio (ref <=5.0)
Triglycerides: 174 mg/dL — ABNORMAL HIGH (ref <150)
VLDL: 35 mg/dL — AB (ref <30)

## 2014-11-01 LAB — BASIC METABOLIC PANEL WITH GFR
BUN: 23 mg/dL (ref 7–25)
CALCIUM: 9.1 mg/dL (ref 8.6–10.3)
CO2: 25 mmol/L (ref 20–31)
Chloride: 106 mmol/L (ref 98–110)
Creat: 1.11 mg/dL (ref 0.70–1.18)
GFR, EST NON AFRICAN AMERICAN: 64 mL/min (ref >=60)
GFR, Est African American: 74 mL/min (ref >=60)
GLUCOSE: 119 mg/dL — AB (ref 65–99)
Potassium: 4.3 mmol/L (ref 3.5–5.3)
Sodium: 141 mmol/L (ref 135–146)

## 2014-11-01 LAB — CBC WITH DIFFERENTIAL/PLATELET
Basophils Absolute: 0.1 10*3/uL (ref 0.0–0.1)
Basophils Relative: 1 % (ref 0–1)
Eosinophils Absolute: 0.2 10*3/uL (ref 0.0–0.7)
Eosinophils Relative: 4 % (ref 0–5)
HEMATOCRIT: 39.6 % (ref 39.0–52.0)
HEMOGLOBIN: 13.8 g/dL (ref 13.0–17.0)
LYMPHS PCT: 30 % (ref 12–46)
Lymphs Abs: 1.7 10*3/uL (ref 0.7–4.0)
MCH: 31.1 pg (ref 26.0–34.0)
MCHC: 34.8 g/dL (ref 30.0–36.0)
MCV: 89.2 fL (ref 78.0–100.0)
MONO ABS: 0.4 10*3/uL (ref 0.1–1.0)
MONOS PCT: 8 % (ref 3–12)
MPV: 8.7 fL (ref 8.6–12.4)
NEUTROS ABS: 3.2 10*3/uL (ref 1.7–7.7)
Neutrophils Relative %: 57 % (ref 43–77)
Platelets: 157 10*3/uL (ref 150–400)
RBC: 4.44 MIL/uL (ref 4.22–5.81)
RDW: 13.7 % (ref 11.5–15.5)
WBC: 5.6 10*3/uL (ref 4.0–10.5)

## 2014-11-01 LAB — HEPATIC FUNCTION PANEL
ALT: 27 U/L (ref 9–46)
AST: 28 U/L (ref 10–35)
Albumin: 4.2 g/dL (ref 3.6–5.1)
Alkaline Phosphatase: 43 U/L (ref 40–115)
BILIRUBIN INDIRECT: 0.5 mg/dL (ref 0.2–1.2)
Bilirubin, Direct: 0.1 mg/dL (ref <=0.2)
TOTAL PROTEIN: 6.3 g/dL (ref 6.1–8.1)
Total Bilirubin: 0.6 mg/dL (ref 0.2–1.2)

## 2014-11-01 LAB — TSH: TSH: 0.62 u[IU]/mL (ref 0.350–4.500)

## 2014-11-01 LAB — MAGNESIUM: Magnesium: 1.6 mg/dL (ref 1.5–2.5)

## 2014-11-01 NOTE — Patient Instructions (Signed)

## 2014-11-01 NOTE — Progress Notes (Signed)
Patient ID: Gerald [name], male   DOB: [birth year], 80 y.o.   MRN: [id number]  MEDICARE ANNUAL WELLNESS VISIT AND CPE  Assessment:   1. Essential hypertension  - EKG 12-Lead - [place], RETROPERITNL ABD,  LTD - TSH  2. Mixed hyperlipidemia  - Lipid panel  3. Insulin Dependent T2_IDDM  - Microalbumin / creatinine urine ratio - HM DIABETES FOOT EXAM - LOW EXTREMITY NEUR EXAM DOCUM - Hemoglobin A1c - Insulin, random  4. Vitamin D deficiency  - Vit D  25 hydroxy (rtn osteoporosis monitoring)  5. Gastroesophageal reflux disease   6. Hypothyroidism  - TSH  7. CKD stage 2 due to type 2 diabetes mellitus   8. BPH    9. Screening for rectal cancer  - POC Hemoccult Bld/  10. Prostate cancer screening   11. Depression screen   12. At low risk for fall   13. Primary osteoarthritis involving multiple joints   14. Encounter for long-term  use of medications  - Urinalysis, Routine w reflex microscopic (not at [facility]) - CBC with Differential/Platelet - BASIC METABOLIC PANEL WITH GFR - Hepatic function panel - Magnesium  15. Bladder neck obstruction   16. Encounter for general adult medical examination with abnormal findings  - Microalbumin / creatinine urine ratio - EKG 12-Lead - [place], RETROPERITNL ABD,  LTD - POC Hemoccult Bld/Stl (3-Cd Home Screen); Future - HM DIABETES FOOT EXAM - LOW EXTREMITY NEUR EXAM DOCUM - Urinalysis, Routine w reflex microscopic (not at [facility]) - CBC with Differential/Platelet - BASIC METABOLIC PANEL WITH GFR - Hepatic function panel - Magnesium - Lipid panel - TSH - Hemoglobin A1c - Insulin, random - Vit D  25 hydroxy   17. Need for influenza vaccination  - Flu vaccine HIGH DOSE PF (Fluzone High dose)  18. Need for prophylactic vaccination with tetanus-diphtheria (TD)  - DT Vaccine greater than 7yo IM      Plan:   During the course of the visit the patient was educated and counseled about appropriate screening and  preventive services including:    Pneumococcal vaccine   Influenza vaccine  Td vaccine  Screening electrocardiogram  Bone densitometry screening  Colorectal cancer screening  Diabetes screening  Glaucoma screening  Nutrition counseling   Advanced directives: requested  Screening recommendations, referrals: Vaccinations: Immunization History  Administered Date(s) Administered  . DT 11/01/2014  . Influenza Whole 11/10/2012  . Influenza, High Dose Seasonal PF 12/03/2013, 11/01/2014  . Pneumococcal Conjugate-13 12/03/2013  . Pneumococcal Polysaccharide-23 07/27/2011  . Tdap 02/23/2004  . Zoster 07/15/2005  Hep B vaccine not indicated  Nutrition assessed and recommended  Colonoscopy 10/31/2013 Recommended yearly ophthalmology/optometry visit for glaucoma screening and checkup Recommended yearly dental visit for hygiene and checkup Advanced directives - yes  Conditions/risks identified: BMI: Discussed weight loss, diet, and increase physical activity.  Increase physical activity: AHA recommends 150 minutes of physical activity a week.  Medications reviewed Diabetes is not at goal, ACE/ARB therapy: Not Indicated Urinary Incontinence is not an issue: discussed non pharmacology and pharmacology options.  Fall risk: low- discussed PT, home fall assessment, medications.   Subjective:      Gerald [name]  presents for [facility] Annual Wellness Visit and complete physical.  Date of last medicare wellness visit was 05/11/2013. This very nice 80 y.o. MWM presents also for follow up with Hypertension, Hyperlipidemia, T2_IDDM w/ Stage 2 CKD and Vitamin D Deficiency.      Patient is treated for HTN  Since 1989 & BP has been  controlled at home. Today's BP: 126/76 mmHg. Patient has had no complaints of any cardiac type chest pain, palpitations, dyspnea/orthopnea/PND, dizziness, claudication, or dependent edema.     Hyperlipidemia is controlled with diet & meds. Patient denies  myalgias or other med SE's. Last Lipids were at goal -  Cholesterol 129; HDL 49; LDL 63; Triglycerides 84 on 07/03/2014.     Also, the patient has history of Insulin dependent T2_IDDM w/CKD 2  since 1999 and has had no symptoms of reactive hypoglycemia, diabetic polys, paresthesias or visual blurring.  Patient relates CBG's usu are less than 120.Last A1c was 6.5% on 07/03/2014.     Further, the patient also has history of Vitamin D Deficiency and supplements vitamin D without any suspected side-effects. Last vitamin D was 75 on 03/06/2014.   Names of Other Physician/Practitioners you currently use: 1. [facility] here for primary care 2. Dr Lenis Noon, eye doctor, last visit 07/2014 3. Dr Carleene Cooper, DDS, dentist, last visit June 2016  Patient Care Team: Lucky Cowboy, MD as PCP - General (Internal Medicine) Louis Meckel, MD as Consulting Physician (Gastroenterology) Jacqualyn Posey, DC (Chiropractic Medicine) Elise Benne, MD as Consulting Physician (Ophthalmology)  Medication Review: Medication Sig  . aspirin 81 MG tablet Take by mouth daily.   . baclofen (LIORESAL) 10 MG tablet TAKE 1/2-1 TABLET BY MOUTH 2-3 TIMES A DAY AS NEEDED FOR MUSCLE SPASMS  . celecoxib  200 MG capsule TAKE 1 CAPSULE BY MOUTH EVERY DAY FOR PAIN AND INFLAMMATION  . VITAMIN D Take 5,000 Units by mouth daily.  . Finasteride 5 MG tablet TAKE 1 TABLET BY MOUTH EVERY DAY  . fish oil 1000 MG capsule Take 1 g by mouth daily.   Marland Kitchen labetalol  100 MG tablet Take 1 tablet (100 mg total) by mouth 2 (two) times daily.  Marland Kitchen LANTUS SOLOSTAR 100 UNIT/ML  USE AS DIRECTED  . levothyroxine  150 MCG tablet Take 1 tablet (150 mcg total) by mouth every morning.  Marland Kitchen losartan-hctz (HYZAAR) 100-25 TAKE 1 TABLET BY MOUTH EVERY DAY FOR HIGH BLOOD PRESSURE  . metFORMIN -XR 500 MG 24 hr tablet TAKE 4 TABLETS BY MOUTH EVERY DAY FOR DIABETES  . minocycline100 MG capsule TAKE 1 CAPSULE BY MOUTH DAILY  .  Multiple Vitamin  Take 1 tablet by mouth daily.  . pantoprazole  40 MG tablet Take 1 tablet (40 mg total) by mouth daily.  . simvastatin  40 MG tablet TAKE 1 TABLET BY MOUTH EVERY NIGHT AT BEDTIME FOR CHOLESTEROL  . ZETIA 10 MG tablet TAKE 1 TABLET BY MOUTH EVERY DAY   Allergies  Allergen Reactions  . Ace Inhibitors   . Beta Adrenergic Blockers    Current Problems (verified) Patient Active Problem List   Diagnosis Date Noted  . GERD  11/01/2014  . Insulin Dependent T2_IDDM 11/01/2014  . BPH  11/01/2014  . DJD (degenerative joint disease) 11/01/2014  . Encounter for long-term (current) use of medications 08/15/2013  . Hypothyroidism 08/15/2013  . Essential hypertension 02/05/2013  . Mixed hyperlipidemia 02/05/2013  . CKD stage 2 due to type 2 diabetes mellitus 02/05/2013  . Vitamin D deficiency 02/05/2013   Screening Tests Health Maintenance  Topic Date Due  . TETANUS/TDAP  02/22/2014  . FOOT EXAM  08/16/2014  . URINE MICROALBUMIN  08/16/2014  . INFLUENZA VACCINE  09/23/2014  . HEMOGLOBIN A1C  01/03/2015  . OPHTHALMOLOGY EXAM  06/17/2015  . COLONOSCOPY  11/01/2023  . ZOSTAVAX  Completed  . PNA vac Low Risk Adult  Completed   Immunization History  Administered Date(s) Administered  . DT 11/01/2014  . Influenza Whole 11/10/2012  . Influenza, High Dose Seasonal PF 12/03/2013, 11/01/2014  . Pneumococcal Conjugate-13 12/03/2013  . Pneumococcal Polysaccharide-23 07/27/2011  . Tdap 02/23/2004  . Zoster 07/15/2005   Preventative care: Last colonoscopy: 10/31/2013  Past Medical History  Diagnosis Date  . GERD (gastroesophageal reflux disease)   . BPH (benign prostatic hypertrophy)   . Rotator cuff tear, right RECURRENT  . Vitamin D deficiency     Past Surgical History  Procedure Laterality Date  . Right shoulder arthroscopy/ labral & biceps tendon debridement/ distal clavicle decompression/ sad/ mini open rotator cuff repair  03-09-2010  . Shoulder arthroscopy   04-10-2004    LEFT  . Lumbar microdiscectomy  04-06-1999    L4 - 5  . Shoulder open rotator cuff repair  06/09/2011    Procedure: ROTATOR CUFF REPAIR SHOULDER OPEN;  Surgeon: Drucilla Schmidt, MD;  Location: [facility];  Service: Orthopedics;  Laterality: Right;  anterior acromionectomy repair right rotator cuff with tissue mend    Risk Factors: Tobacco Social History  Substance Use Topics  . Smoking status: Former Smoker    Types: Cigarettes    Quit date: 06/03/1990  . Smokeless tobacco: Never Used  . Alcohol Use: No     Comment: OCCASIONAL , WINE-no longer drinks alcohol   He does not smoke.  Patient is a former smoker. Are there smokers in your home (other than you)?  No  Alcohol Current alcohol use: glass of wine with dinner  Caffeine Current caffeine use: coffee 2 cups /day  Exercise Current exercise: gardening, walking, yard work and golfing  Nutrition/Diet Current diet: in general, a "healthy" diet    Cardiac risk factors: advanced age (older than 106 for men, 46 for women), diabetes mellitus, dyslipidemia, hypertension, male gender and obesity (BMI >= 30 kg/m2).  Depression Screen (Note: if answer to either of the following is "Yes", a more complete depression screening is indicated)   Q1: Over the past two weeks, have you felt down, depressed or hopeless? No  Q2: Over the past two weeks, have you felt little interest or pleasure in doing things? No  Have you lost interest or pleasure in daily life? No  Do you often feel hopeless? No  Do you cry easily over simple problems? No  Activities of Daily Living In your present state of health, do you have any difficulty performing the following activities?:  Driving? No Managing money?  No Feeding yourself? No Getting from bed to chair? No Climbing a flight of stairs? No Preparing food and eating?: No Bathing or showering? No Getting dressed: No Getting to the toilet? No Using the  toilet:No Moving around from place to place: No In the past year have you fallen or had a near fall?:No   Are you sexually active?  No  Do you have more than one partner?  No  Vision Difficulties: No  Hearing Difficulties: No Do you often ask people to speak up or repeat themselves? No Do you experience ringing or noises in your ears? No Do you have difficulty understanding soft or whispered voices? No  Cognition  Do you feel that you have a problem with memory?No  Do you often misplace items? No  Do you feel safe at home?  Yes  Advanced directives Does patient have a Health Care Power of Attorney? Yes Does patient  have a Living Will? Yes  ROS: Constitutional: Denies fever, chills, weight loss/gain, headaches, insomnia, fatigue, night sweats or change in appetite. Eyes: Denies redness, blurred vision, diplopia, discharge, itchy or watery eyes.  ENT: Denies discharge, congestion, post nasal drip, epistaxis, sore throat, earache, hearing loss, dental pain, Tinnitus, Vertigo, Sinus pain or snoring.  Cardio: Denies chest pain, palpitations, irregular heartbeat, syncope, dyspnea, diaphoresis, orthopnea, PND, claudication or edema Respiratory: denies cough, dyspnea, DOE, pleurisy, hoarseness, laryngitis or wheezing.  Gastrointestinal: Denies dysphagia, heartburn, reflux, water brash, pain, cramps, nausea, vomiting, bloating, diarrhea, constipation, hematemesis, melena, hematochezia, jaundice or hemorrhoids Genitourinary: Denies dysuria, frequency, urgency, nocturia, hesitancy, discharge, hematuria or flank pain Musculoskeletal: Denies arthralgia, myalgia, stiffness, Jt. Swelling, pain, limp or strain/sprain. Denies Falls. Skin: Denies puritis, rash, hives, warts, acne, eczema or change in skin lesion Neuro: No weakness, tremor, incoordination, spasms, paresthesia or pain Psychiatric: Denies confusion, memory loss or sensory loss. Denies Depression. Endocrine: Denies change in weight,  skin, hair change, nocturia, and paresthesia, diabetic polys, visual blurring or hyper / hypo glycemic episodes.  Heme/Lymph: No excessive bleeding, bruising or enlarged lymph nodes.  Objective:     BP 126/76 mmHg  Pulse 64  Temp(Src) 97.7 F (36.5 C)  Resp 16  Ht 5' 9.25" (1.759 m)  Wt 161 lb 3.2 oz (73.12 kg)  BMI 23.63 kg/m2  General Appearance:  Alert  WD/WN, male  in no apparent distress. Eyes: PERRLA, EOMs nl, conjunctiva normal, normal fundi and vessels. Sinuses: No frontal/maxillary tenderness ENT/Mouth: EACs patent / TMs  nl. Nares clear without erythema, swelling, mucoid exudates. Oral hygiene is good. No erythema, swelling, or exudate. Tongue normal, non-obstructing. Tonsils not swollen or erythematous. Hearing normal.  Neck: Supple, thyroid normal. No bruits, nodes or JVD. Respiratory: Respiratory effort normal.  BS equal and clear bilateral without rales, rhonci, wheezing or stridor. Cardio: Heart sounds are normal with regular rate and rhythm and no murmurs, rubs or gallops. Peripheral pulses are normal and equal bilaterally without edema. No aortic or femoral bruits. Chest: symmetric with normal excursions and percussion.  Abdomen: Flat, soft, with nl bowel sounds. Nontender, no guarding, rebound, hernias, masses, or organomegaly.  Lymphatics: Non tender without lymphadenopathy.  Genitourinary: No hernias.Testes nl. DRE - prostate nl for age - smooth & firm w/o nodules. Musculoskeletal: Full ROM all peripheral extremities, joint stability, 5/5 strength, and normal gait. Skin: Warm and dry without rashes, lesions, cyanosis, clubbing or  ecchymosis.  Neuro: Cranial nerves intact, reflexes equal bilaterally. Normal muscle tone, no cerebellar symptoms. Sensation intact.  Pysch: Alert and oriented X 3 with normal affect, insight and judgment appropriate.   Cognitive Testing  Alert? Yes  Normal Appearance? Yes  Oriented to person? Yes  Place? Yes   Time? Yes  Recall of  three objects?  Yes  Can perform simple calculations? Yes  Displays appropriate judgment? Yes  Can read the correct time from a watch/clock? Yes  Medicare Attestation I have personally reviewed: The patient's medical and social history Their use of alcohol, tobacco or illicit drugs Their current medications and supplements The patient's functional ability including ADLs,fall risks, home safety risks, cognitive, and hearing and visual impairment Diet and physical activities Evidence for depression or mood disorders  The patient's weight, height, BMI, and visual acuity have been recorded in the chart.  I have made referrals, counseling, and provided education to the patient based on review of the above and I have provided the patient with a written personalized care plan for preventive services.  Over 40 minutes of exam, counseling, chart review was performed.  Kimani Hovis DAVID, MD   11/01/2014

## 2014-11-02 LAB — URINALYSIS, ROUTINE W REFLEX MICROSCOPIC
BILIRUBIN URINE: NEGATIVE
Hgb urine dipstick: NEGATIVE
Ketones, ur: NEGATIVE
Leukocytes, UA: NEGATIVE
Nitrite: NEGATIVE
PH: 5.5 (ref 5.0–8.0)
Protein, ur: NEGATIVE
SPECIFIC GRAVITY, URINE: 1.033 (ref 1.001–1.035)

## 2014-11-02 LAB — URINALYSIS, MICROSCOPIC ONLY
BACTERIA UA: NONE SEEN [HPF]
CASTS: NONE SEEN [LPF]
Crystals: NONE SEEN [HPF]
RBC / HPF: NONE SEEN RBC/HPF (ref <=2)
SQUAMOUS EPITHELIAL / LPF: NONE SEEN [HPF] (ref <=5)
WBC, UA: NONE SEEN WBC/HPF (ref <=5)
YEAST: NONE SEEN [HPF]

## 2014-11-02 LAB — VITAMIN D 25 HYDROXY (VIT D DEFICIENCY, FRACTURES): Vit D, 25-Hydroxy: 68 ng/mL (ref 30–100)

## 2014-11-02 LAB — MICROALBUMIN / CREATININE URINE RATIO
Creatinine, Urine: 272.1 mg/dL
Microalb Creat Ratio: 8.1 mg/g (ref 0.0–30.0)
Microalb, Ur: 2.2 mg/dL — ABNORMAL HIGH (ref <2.0)

## 2014-11-02 LAB — HEMOGLOBIN A1C
HEMOGLOBIN A1C: 7.1 % — AB (ref <5.7)
MEAN PLASMA GLUCOSE: 157 mg/dL — AB (ref <117)

## 2014-11-02 LAB — INSULIN, RANDOM: Insulin: 36.1 u[IU]/mL — ABNORMAL HIGH (ref 2.0–19.6)

## 2014-11-07 DIAGNOSIS — E119 Type 2 diabetes mellitus without complications: Secondary | ICD-10-CM | POA: Diagnosis not present

## 2014-11-07 DIAGNOSIS — I10 Essential (primary) hypertension: Secondary | ICD-10-CM | POA: Diagnosis not present

## 2014-11-07 DIAGNOSIS — Z9089 Acquired absence of other organs: Secondary | ICD-10-CM | POA: Diagnosis not present

## 2014-11-07 DIAGNOSIS — M439 Deforming dorsopathy, unspecified: Secondary | ICD-10-CM | POA: Diagnosis not present

## 2014-11-07 DIAGNOSIS — E785 Hyperlipidemia, unspecified: Secondary | ICD-10-CM | POA: Diagnosis not present

## 2014-11-07 DIAGNOSIS — M25512 Pain in left shoulder: Secondary | ICD-10-CM | POA: Diagnosis not present

## 2014-11-07 DIAGNOSIS — M19012 Primary osteoarthritis, left shoulder: Secondary | ICD-10-CM | POA: Diagnosis not present

## 2014-11-07 DIAGNOSIS — M47812 Spondylosis without myelopathy or radiculopathy, cervical region: Secondary | ICD-10-CM | POA: Diagnosis not present

## 2014-11-07 DIAGNOSIS — Z87891 Personal history of nicotine dependence: Secondary | ICD-10-CM | POA: Diagnosis not present

## 2014-11-07 DIAGNOSIS — M47814 Spondylosis without myelopathy or radiculopathy, thoracic region: Secondary | ICD-10-CM | POA: Diagnosis not present

## 2014-11-07 DIAGNOSIS — M545 Low back pain: Secondary | ICD-10-CM | POA: Diagnosis not present

## 2014-11-07 DIAGNOSIS — M9903 Segmental and somatic dysfunction of lumbar region: Secondary | ICD-10-CM | POA: Diagnosis not present

## 2014-11-07 DIAGNOSIS — E039 Hypothyroidism, unspecified: Secondary | ICD-10-CM | POA: Diagnosis not present

## 2014-11-07 DIAGNOSIS — M75121 Complete rotator cuff tear or rupture of right shoulder, not specified as traumatic: Secondary | ICD-10-CM | POA: Diagnosis not present

## 2014-11-13 ENCOUNTER — Other Ambulatory Visit: Payer: Self-pay | Admitting: *Deleted

## 2014-11-13 DIAGNOSIS — Z0001 Encounter for general adult medical examination with abnormal findings: Secondary | ICD-10-CM

## 2014-11-13 DIAGNOSIS — Z1212 Encounter for screening for malignant neoplasm of rectum: Secondary | ICD-10-CM

## 2014-11-13 LAB — POC HEMOCCULT BLD/STL (HOME/3-CARD/SCREEN)
Card #2 Fecal Occult Blod, POC: NEGATIVE
FECAL OCCULT BLD: NEGATIVE
FECAL OCCULT BLD: NEGATIVE

## 2014-11-16 ENCOUNTER — Other Ambulatory Visit: Payer: Self-pay | Admitting: Physician Assistant

## 2014-11-25 DIAGNOSIS — M9903 Segmental and somatic dysfunction of lumbar region: Secondary | ICD-10-CM | POA: Diagnosis not present

## 2014-11-25 DIAGNOSIS — M5417 Radiculopathy, lumbosacral region: Secondary | ICD-10-CM | POA: Diagnosis not present

## 2014-11-25 DIAGNOSIS — M9905 Segmental and somatic dysfunction of pelvic region: Secondary | ICD-10-CM | POA: Diagnosis not present

## 2014-11-25 DIAGNOSIS — M545 Low back pain: Secondary | ICD-10-CM | POA: Diagnosis not present

## 2014-11-26 DIAGNOSIS — M9903 Segmental and somatic dysfunction of lumbar region: Secondary | ICD-10-CM | POA: Diagnosis not present

## 2014-11-26 DIAGNOSIS — M5417 Radiculopathy, lumbosacral region: Secondary | ICD-10-CM | POA: Diagnosis not present

## 2014-11-26 DIAGNOSIS — M545 Low back pain: Secondary | ICD-10-CM | POA: Diagnosis not present

## 2014-11-26 DIAGNOSIS — M9905 Segmental and somatic dysfunction of pelvic region: Secondary | ICD-10-CM | POA: Diagnosis not present

## 2014-11-27 DIAGNOSIS — M545 Low back pain: Secondary | ICD-10-CM | POA: Diagnosis not present

## 2014-11-27 DIAGNOSIS — M9903 Segmental and somatic dysfunction of lumbar region: Secondary | ICD-10-CM | POA: Diagnosis not present

## 2014-11-27 DIAGNOSIS — M5417 Radiculopathy, lumbosacral region: Secondary | ICD-10-CM | POA: Diagnosis not present

## 2014-11-27 DIAGNOSIS — M9905 Segmental and somatic dysfunction of pelvic region: Secondary | ICD-10-CM | POA: Diagnosis not present

## 2014-11-28 ENCOUNTER — Other Ambulatory Visit: Payer: Self-pay | Admitting: Internal Medicine

## 2014-11-28 DIAGNOSIS — M5417 Radiculopathy, lumbosacral region: Secondary | ICD-10-CM | POA: Diagnosis not present

## 2014-11-28 DIAGNOSIS — M9905 Segmental and somatic dysfunction of pelvic region: Secondary | ICD-10-CM | POA: Diagnosis not present

## 2014-11-28 DIAGNOSIS — M9903 Segmental and somatic dysfunction of lumbar region: Secondary | ICD-10-CM | POA: Diagnosis not present

## 2014-11-28 DIAGNOSIS — M545 Low back pain: Secondary | ICD-10-CM | POA: Diagnosis not present

## 2014-12-02 DIAGNOSIS — M545 Low back pain: Secondary | ICD-10-CM | POA: Diagnosis not present

## 2014-12-02 DIAGNOSIS — M9903 Segmental and somatic dysfunction of lumbar region: Secondary | ICD-10-CM | POA: Diagnosis not present

## 2014-12-02 DIAGNOSIS — M9905 Segmental and somatic dysfunction of pelvic region: Secondary | ICD-10-CM | POA: Diagnosis not present

## 2014-12-02 DIAGNOSIS — M5417 Radiculopathy, lumbosacral region: Secondary | ICD-10-CM | POA: Diagnosis not present

## 2014-12-05 ENCOUNTER — Other Ambulatory Visit: Payer: Self-pay | Admitting: Internal Medicine

## 2014-12-05 DIAGNOSIS — M545 Low back pain: Secondary | ICD-10-CM | POA: Diagnosis not present

## 2014-12-05 DIAGNOSIS — M9905 Segmental and somatic dysfunction of pelvic region: Secondary | ICD-10-CM | POA: Diagnosis not present

## 2014-12-05 DIAGNOSIS — M5417 Radiculopathy, lumbosacral region: Secondary | ICD-10-CM | POA: Diagnosis not present

## 2014-12-05 DIAGNOSIS — M9903 Segmental and somatic dysfunction of lumbar region: Secondary | ICD-10-CM | POA: Diagnosis not present

## 2014-12-23 DIAGNOSIS — M75121 Complete rotator cuff tear or rupture of right shoulder, not specified as traumatic: Secondary | ICD-10-CM | POA: Diagnosis not present

## 2014-12-27 DIAGNOSIS — M75121 Complete rotator cuff tear or rupture of right shoulder, not specified as traumatic: Secondary | ICD-10-CM | POA: Diagnosis not present

## 2014-12-30 DIAGNOSIS — M75121 Complete rotator cuff tear or rupture of right shoulder, not specified as traumatic: Secondary | ICD-10-CM | POA: Diagnosis not present

## 2014-12-31 ENCOUNTER — Other Ambulatory Visit: Payer: Self-pay | Admitting: Internal Medicine

## 2015-01-03 DIAGNOSIS — M75121 Complete rotator cuff tear or rupture of right shoulder, not specified as traumatic: Secondary | ICD-10-CM | POA: Diagnosis not present

## 2015-01-06 DIAGNOSIS — M75121 Complete rotator cuff tear or rupture of right shoulder, not specified as traumatic: Secondary | ICD-10-CM | POA: Diagnosis not present

## 2015-01-09 ENCOUNTER — Encounter: Payer: Self-pay | Admitting: Internal Medicine

## 2015-01-09 ENCOUNTER — Ambulatory Visit (INDEPENDENT_AMBULATORY_CARE_PROVIDER_SITE_OTHER): Payer: Medicare Other | Admitting: Internal Medicine

## 2015-01-09 VITALS — BP 164/76 | HR 60 | Temp 97.3°F | Resp 16 | Ht 69.5 in | Wt 162.8 lb

## 2015-01-09 DIAGNOSIS — I10 Essential (primary) hypertension: Secondary | ICD-10-CM

## 2015-01-09 DIAGNOSIS — R413 Other amnesia: Secondary | ICD-10-CM

## 2015-01-10 ENCOUNTER — Other Ambulatory Visit: Payer: Self-pay | Admitting: Internal Medicine

## 2015-01-10 DIAGNOSIS — E119 Type 2 diabetes mellitus without complications: Secondary | ICD-10-CM

## 2015-01-10 DIAGNOSIS — Z794 Long term (current) use of insulin: Principal | ICD-10-CM

## 2015-01-10 DIAGNOSIS — M75121 Complete rotator cuff tear or rupture of right shoulder, not specified as traumatic: Secondary | ICD-10-CM | POA: Diagnosis not present

## 2015-01-10 DIAGNOSIS — I10 Essential (primary) hypertension: Secondary | ICD-10-CM

## 2015-01-10 DIAGNOSIS — H35413 Lattice degeneration of retina, bilateral: Secondary | ICD-10-CM | POA: Diagnosis not present

## 2015-01-12 NOTE — Progress Notes (Signed)
Subjective:    Patient ID: Gerald [name], male    DOB: 30-May-1934, 80 y.o.   MRN: [id number]  HPI Very nice 80 yo MWM with HTN, HLD, Hypothyroidism, Insulin dep T2_DM who is very  functional presented somewhat upset and anxious after scoring low on  a screening MMSE at a pharmaceutical sponsored fair for Memory disorders promoting [employer]. He apparently became flustered during the test as the person was very abrupt in administering the screening test. He denies any significant problems with short or long term memory recall, finances, driving , etc.   Medication Sig  . aspirin 81 MG tablet Take by mouth daily.   . baclofen (LIORESAL) 10 MG tablet TAKE 1/2-1 TABLET BY MOUTH 2-3 TIMES A DAY AS NEEDED FOR MUSCLE SPASMS  . celecoxib (CELEBREX) 200 MG capsule TAKE 1 CAPSULE BY MOUTH EVERY DAY FOR PAIN AND INFLAMMATION  . Cholecalciferol (VITAMIN D PO) Take 5,000 Units by mouth daily.  . finasteride (PROSCAR) 5 MG tablet TAKE 1 TABLET BY MOUTH DAILY  . fish oil-omega-3 fatty acids 1000 MG capsule Take 1 g by mouth daily.   Marland Kitchen labetalol (NORMODYNE) 100 MG tablet TAKE 1 TABLET BY MOUTH TWICE DAILY  . LANTUS SOLOSTAR 100 UNIT/ML Solostar Pen USE AS DIRECTED  . levothyroxine (SYNTHROID, LEVOTHROID) 150 MCG tablet TAKE 1 TABLET BY MOUTH EVERY MORNING  . metFORMIN (GLUCOPHAGE-XR) 500 MG 24 hr tablet TAKE 4 TABLETS BY MOUTH EVERY DAY FOR DIABETES  . minocycline (MINOCIN,DYNACIN) 100 MG capsule TAKE 1 CAPSULE BY MOUTH DAILY  . Multiple Vitamin (MULTIVITAMIN) tablet Take 1 tablet by mouth daily.  . pantoprazole (PROTONIX) 40 MG tablet TAKE 1 TABLET(40 MG) BY MOUTH DAILY  . simvastatin (ZOCOR) 40 MG tablet TAKE 1 TABLET BY MOUTH EVERY NIGHT AT BEDTIME FOR CHOLESTEROL  . ZETIA 10 MG tablet TAKE 1 TABLET BY MOUTH EVERY DAY  . Insulin Pen Needle 32G X 4 MM MISC Use 1 time daily for Lantus injection-DX-10.29  . losartan-hydrochlorothiazide (HYZAAR) 100-25 MG per tablet TAKE 1 TABLET BY MOUTH EVERY DAY FOR HIGH  BLOOD PRESSURE   Allergies  Allergen Reactions  . Ace Inhibitors   . Beta Adrenergic Blockers    Past Medical History  Diagnosis Date  . GERD (gastroesophageal reflux disease)   . BPH (benign prostatic hypertrophy)   . Rotator cuff tear, right RECURRENT  . Vitamin D deficiency    Past Surgical History  Procedure Laterality Date  . Right shoulder arthroscopy/ labral & biceps tendon debridement/ distal clavicle decompression/ sad/ mini open rotator cuff repair  03-09-2010  . Shoulder arthroscopy  04-10-2004    LEFT  . Lumbar microdiscectomy  04-06-1999    L4 - 5  . Shoulder open rotator cuff repair  06/09/2011    Procedure: ROTATOR CUFF REPAIR SHOULDER OPEN;  Surgeon: Drucilla Schmidt, MD;  Location: [facility] [place];  Service: Orthopedics;  Laterality: Right;  anterior acromionectomy repair right rotator cuff with tissue mend    Review of Systems  10 point systems review negative except as above.    Objective:   Physical Exam  BP 164/76 mmHg  Pulse 60  Temp(Src) 97.3 F (36.3 C)  Resp 16  Ht 5' 9.5" (1.765 m)  Wt 162 lb 12.8 oz (73.846 kg)  BMI 23.70 kg/m2  HEENT - Eac's patent. TM's Nl. EOM's full. PERRLA. NasoOroPharynx clear. Neck - supple. Nl Thyroid. Carotids 2+ & No bruits, nodes, JVD Chest - Clear equal BS w/o Rales, rhonchi, wheezes. Cor - Nl  HS. RRR w/o sig MGR. PP 1(+). No edema. Abd - No palpable organomegaly, masses or tenderness. BS nl. MS- FROM w/o deformities. Muscle power, tone and bulk Nl. Gait Nl. Neuro - No obvious Cr N abnormalities. Sensory, motor and Cerebellar functions appear Nl w/o focal abnormalities. Psyche - Mental status normal & appropriate.  No delusions, ideations or obvious mood abnormalities.  Patient was administered the standard MMSE and scored 100% correct and both clock test and spacial drawing were Normal.     Assessment & Plan:   1. Essential hypertension  - monitor BP's & call if remain elevated  2. Complaint of  memory disorder without observed objective memory deficit  - MMSE - WNL  - Patient was reassured

## 2015-01-13 ENCOUNTER — Other Ambulatory Visit: Payer: Self-pay | Admitting: *Deleted

## 2015-01-13 DIAGNOSIS — E119 Type 2 diabetes mellitus without complications: Secondary | ICD-10-CM

## 2015-01-13 DIAGNOSIS — Z794 Long term (current) use of insulin: Principal | ICD-10-CM

## 2015-01-13 DIAGNOSIS — M75121 Complete rotator cuff tear or rupture of right shoulder, not specified as traumatic: Secondary | ICD-10-CM | POA: Diagnosis not present

## 2015-01-13 MED ORDER — INSULIN PEN NEEDLE 32G X 4 MM MISC: Status: DC

## 2015-01-20 DIAGNOSIS — M75121 Complete rotator cuff tear or rupture of right shoulder, not specified as traumatic: Secondary | ICD-10-CM | POA: Diagnosis not present

## 2015-01-22 DIAGNOSIS — M75121 Complete rotator cuff tear or rupture of right shoulder, not specified as traumatic: Secondary | ICD-10-CM | POA: Diagnosis not present

## 2015-01-22 DIAGNOSIS — M545 Low back pain: Secondary | ICD-10-CM | POA: Diagnosis not present

## 2015-01-22 DIAGNOSIS — M9901 Segmental and somatic dysfunction of cervical region: Secondary | ICD-10-CM | POA: Diagnosis not present

## 2015-01-24 DIAGNOSIS — M9903 Segmental and somatic dysfunction of lumbar region: Secondary | ICD-10-CM | POA: Diagnosis not present

## 2015-01-24 DIAGNOSIS — M5417 Radiculopathy, lumbosacral region: Secondary | ICD-10-CM | POA: Diagnosis not present

## 2015-01-24 DIAGNOSIS — M9905 Segmental and somatic dysfunction of pelvic region: Secondary | ICD-10-CM | POA: Diagnosis not present

## 2015-01-24 DIAGNOSIS — M545 Low back pain: Secondary | ICD-10-CM | POA: Diagnosis not present

## 2015-01-27 DIAGNOSIS — M75121 Complete rotator cuff tear or rupture of right shoulder, not specified as traumatic: Secondary | ICD-10-CM | POA: Diagnosis not present

## 2015-01-29 DIAGNOSIS — M75121 Complete rotator cuff tear or rupture of right shoulder, not specified as traumatic: Secondary | ICD-10-CM | POA: Diagnosis not present

## 2015-02-03 DIAGNOSIS — M75121 Complete rotator cuff tear or rupture of right shoulder, not specified as traumatic: Secondary | ICD-10-CM | POA: Diagnosis not present

## 2015-02-07 ENCOUNTER — Encounter: Payer: Self-pay | Admitting: Internal Medicine

## 2015-02-07 ENCOUNTER — Ambulatory Visit (INDEPENDENT_AMBULATORY_CARE_PROVIDER_SITE_OTHER): Payer: Medicare Other | Admitting: Internal Medicine

## 2015-02-07 VITALS — BP 142/64 | HR 62 | Temp 98.2°F | Resp 16 | Ht 69.5 in | Wt 166.0 lb

## 2015-02-07 DIAGNOSIS — E782 Mixed hyperlipidemia: Secondary | ICD-10-CM

## 2015-02-07 DIAGNOSIS — E119 Type 2 diabetes mellitus without complications: Secondary | ICD-10-CM | POA: Diagnosis not present

## 2015-02-07 DIAGNOSIS — Z794 Long term (current) use of insulin: Secondary | ICD-10-CM

## 2015-02-07 DIAGNOSIS — E039 Hypothyroidism, unspecified: Secondary | ICD-10-CM

## 2015-02-07 DIAGNOSIS — E559 Vitamin D deficiency, unspecified: Secondary | ICD-10-CM | POA: Diagnosis not present

## 2015-02-07 DIAGNOSIS — Z79899 Other long term (current) drug therapy: Secondary | ICD-10-CM

## 2015-02-07 DIAGNOSIS — I10 Essential (primary) hypertension: Secondary | ICD-10-CM | POA: Diagnosis not present

## 2015-02-07 LAB — CBC WITH DIFFERENTIAL/PLATELET
BASOS ABS: 0.1 10*3/uL (ref 0.0–0.1)
BASOS PCT: 1 % (ref 0–1)
EOS ABS: 0.2 10*3/uL (ref 0.0–0.7)
Eosinophils Relative: 3 % (ref 0–5)
HCT: 39.6 % (ref 39.0–52.0)
HEMOGLOBIN: 13.6 g/dL (ref 13.0–17.0)
Lymphocytes Relative: 30 % (ref 12–46)
Lymphs Abs: 1.7 10*3/uL (ref 0.7–4.0)
MCH: 31.3 pg (ref 26.0–34.0)
MCHC: 34.3 g/dL (ref 30.0–36.0)
MCV: 91.2 fL (ref 78.0–100.0)
MPV: 9 fL (ref 8.6–12.4)
Monocytes Absolute: 0.5 10*3/uL (ref 0.1–1.0)
Monocytes Relative: 9 % (ref 3–12)
NEUTROS ABS: 3.1 10*3/uL (ref 1.7–7.7)
NEUTROS PCT: 57 % (ref 43–77)
PLATELETS: 165 10*3/uL (ref 150–400)
RBC: 4.34 MIL/uL (ref 4.22–5.81)
RDW: 13.5 % (ref 11.5–15.5)
WBC: 5.5 10*3/uL (ref 4.0–10.5)

## 2015-02-07 LAB — LIPID PANEL
CHOL/HDL RATIO: 2.9 ratio (ref <=5.0)
Cholesterol: 117 mg/dL — ABNORMAL LOW (ref 125–200)
HDL: 40 mg/dL (ref >=40)
LDL CALC: 55 mg/dL (ref <130)
Triglycerides: 112 mg/dL (ref <150)
VLDL: 22 mg/dL (ref <30)

## 2015-02-07 LAB — HEPATIC FUNCTION PANEL
ALT: 33 U/L (ref 9–46)
AST: 34 U/L (ref 10–35)
Albumin: 4.3 g/dL (ref 3.6–5.1)
Alkaline Phosphatase: 44 U/L (ref 40–115)
Bilirubin, Direct: 0.1 mg/dL
Indirect Bilirubin: 0.5 mg/dL (ref 0.2–1.2)
Total Bilirubin: 0.6 mg/dL (ref 0.2–1.2)
Total Protein: 6 g/dL — ABNORMAL LOW (ref 6.1–8.1)

## 2015-02-07 LAB — BASIC METABOLIC PANEL WITH GFR
BUN: 26 mg/dL — ABNORMAL HIGH (ref 7–25)
CHLORIDE: 105 mmol/L (ref 98–110)
CO2: 24 mmol/L (ref 20–31)
Calcium: 9.1 mg/dL (ref 8.6–10.3)
Creat: 1.2 mg/dL — ABNORMAL HIGH (ref 0.70–1.18)
GFR, EST NON AFRICAN AMERICAN: 58 mL/min — AB (ref >=60)
GFR, Est African American: 67 mL/min (ref >=60)
Glucose, Bld: 160 mg/dL — ABNORMAL HIGH (ref 65–99)
POTASSIUM: 4.5 mmol/L (ref 3.5–5.3)
SODIUM: 139 mmol/L (ref 135–146)

## 2015-02-07 LAB — TSH: TSH: 1.846 u[IU]/mL (ref 0.350–4.500)

## 2015-02-07 NOTE — Progress Notes (Signed)
Patient ID: Gerald [name] Gerald [name], male   DOB: 07/20/1934, 80 y.o.   MRN: [id number]  Assessment and Plan:  Hypertension:  -Continue medication -monitor blood pressure at home. -Continue DASH diet -Reminder to go to the ER if any CP, SOB, nausea, dizziness, severe HA, changes vision/speech, left arm numbness and tingling and jaw pain.  Cholesterol - Continue diet and exercise -Check cholesterol.   Diabetes with diabetic chronic kidney disease  -likely needs to have a split dose of lantus or change to 70/30 -Continue diet and exercise.  -Check A1C  Vitamin D Def -check level -continue medications.   Continue diet and meds as discussed. Further disposition pending results of labs. Discussed med's effects and SE's.    HPI 80 y.o. male  presents for 3 month follow up with hypertension, hyperlipidemia, diabetes and vitamin D deficiency.   His blood pressure has been controlled at home, today their BP is BP: (!) 142/64 mmHg.He does workout. He denies chest pain, shortness of breath, dizziness.   He is on cholesterol medication and denies myalgias. His cholesterol is at goal. The cholesterol was:  11/01/2014: Cholesterol 152; HDL 46; LDL Cholesterol 71; Triglycerides 174*   He has been working on diet and exercise for diabetes without complications, he is on bASA, he is on ACE/ARB, and denies  foot ulcerations, hyperglycemia, hypoglycemia , increased appetite, nausea, paresthesia of the feet, polydipsia, polyuria, visual disturbances, vomiting and weight loss. Last A1C was: 11/01/2014: Hgb A1c MFr Bld 7.1*. He reports that his blood sugar has been running high recently because he reports that he has been having multiple shots in his joints.  His last shot was 3 months ago.  He reports he is using 51 units of his lantus in the morning.  He reports that blood sugar hasn't dropped less than 70.  He reports that he hasn't changed his diet at all.  No change in portion size.    Patient is on Vitamin D  supplement. 11/01/2014: Vit D, 25-Hydroxy 68  He reports that he is still doing rehab on his left shoulder.  He reports that it is improving and he is hopeful he won't have to have surgery on it again.  Current Medications:  Current Outpatient Prescriptions on File Prior to Visit  Medication Sig Dispense Refill  . aspirin 81 MG tablet Take by mouth daily.     . baclofen (LIORESAL) 10 MG tablet TAKE 1/2-1 TABLET BY MOUTH 2-3 TIMES A DAY AS NEEDED FOR MUSCLE SPASMS 60 tablet 3  . celecoxib (CELEBREX) 200 MG capsule TAKE 1 CAPSULE BY MOUTH EVERY DAY FOR PAIN AND INFLAMMATION 90 capsule 99  . Cholecalciferol (VITAMIN D PO) Take 5,000 Units by mouth daily.    . finasteride (PROSCAR) 5 MG tablet TAKE 1 TABLET BY MOUTH DAILY 90 tablet 1  . fish oil-omega-3 fatty acids 1000 MG capsule Take 1 g by mouth daily.     . Insulin Pen Needle (BD PEN NEEDLE NANO U/F) 32G X 4 MM MISC INJECT 1 TIME DAILY FOR LANTUS 100 each 99  . labetalol (NORMODYNE) 100 MG tablet TAKE 1 TABLET BY MOUTH TWICE DAILY 180 tablet 0  . LANTUS SOLOSTAR 100 UNIT/ML Solostar Pen USE AS DIRECTED 15 mL 99  . levothyroxine (SYNTHROID, LEVOTHROID) 150 MCG tablet TAKE 1 TABLET BY MOUTH EVERY MORNING 90 tablet 0  . losartan-hydrochlorothiazide (HYZAAR) 100-25 MG tablet TAKE 1 TABLET BY MOUTH EVERY DAY FOR HIGH BLOOD PRESSURE 90 tablet 1  . metFORMIN (GLUCOPHAGE-XR) 500 MG  24 hr tablet TAKE 4 TABLETS BY MOUTH EVERY DAY FOR DIABETES 360 tablet 0  . minocycline (MINOCIN,DYNACIN) 100 MG capsule TAKE 1 CAPSULE BY MOUTH DAILY 90 capsule 0  . Multiple Vitamin (MULTIVITAMIN) tablet Take 1 tablet by mouth daily.    . pantoprazole (PROTONIX) 40 MG tablet TAKE 1 TABLET(40 MG) BY MOUTH DAILY 90 tablet 0  . simvastatin (ZOCOR) 40 MG tablet TAKE 1 TABLET BY MOUTH EVERY NIGHT AT BEDTIME FOR CHOLESTEROL 90 tablet PRN  . ZETIA 10 MG tablet TAKE 1 TABLET BY MOUTH EVERY DAY 30 tablet 99   No current facility-administered medications on file prior to visit.    Medical History:  Past Medical History  Diagnosis Date  . GERD (gastroesophageal reflux disease)   . BPH (benign prostatic hypertrophy)   . Rotator cuff tear, right RECURRENT  . Vitamin D deficiency    Allergies:  Allergies  Allergen Reactions  . Ace Inhibitors   . Beta Adrenergic Blockers      Review of Systems:  Review of Systems  Constitutional: Negative for fever, weight loss and malaise/fatigue.  HENT: Negative for congestion, ear pain and sore throat.   Respiratory: Negative for cough, shortness of breath and wheezing.   Cardiovascular: Negative for chest pain, palpitations and leg swelling.  Gastrointestinal: Negative for abdominal pain, diarrhea, constipation, blood in stool and melena.  Genitourinary: Negative.   Neurological: Negative for dizziness, sensory change, loss of consciousness and headaches.  Psychiatric/Behavioral: Negative for depression. The patient is not nervous/anxious and does not have insomnia.     Family history- Review and unchanged  Social history- Review and unchanged  Physical Exam: BP 142/64 mmHg  Pulse 62  Temp(Src) 98.2 F (36.8 C) (Temporal)  Resp 16  Ht 5' 9.5" (1.765 [name])  Wt 166 lb (75.297 kg)  BMI 24.17 kg/m2 Wt Readings from Last 3 Encounters:  02/07/15 166 lb (75.297 kg)  01/09/15 162 lb 12.8 oz (73.846 kg)  11/01/14 161 lb 3.2 oz (73.12 kg)   General Appearance: Well nourished well developed, non-toxic appearing, in no apparent distress. Eyes: PERRLA, EOMs, conjunctiva no swelling or erythema ENT/Mouth: Ear canals clear with no erythema, swelling, or discharge.  TMs normal bilaterally, oropharynx clear, moist, with no exudate.   Neck: Supple, thyroid normal, no JVD, no cervical adenopathy.  Respiratory: Respiratory effort normal, breath sounds clear A&P, no wheeze, rhonchi or rales noted.  No retractions, no accessory muscle usage Cardio: RRR with no MRGs. No noted edema.  Abdomen: Soft, + BS.  Non tender, no guarding,  rebound, hernias, masses. Musculoskeletal: Full ROM, 5/5 strength, Normal gait Skin: Warm, dry without rashes, lesions, ecchymosis.  Neuro: Awake and oriented X 3, Cranial nerves intact. No cerebellar symptoms.  Psych: normal affect, Insight and Judgment appropriate.    Terri Piedra, PA-C 9:13 AM [facility]

## 2015-02-08 LAB — HEMOGLOBIN A1C
Hgb A1c MFr Bld: 7.1 % — ABNORMAL HIGH (ref <5.7)
Mean Plasma Glucose: 157 mg/dL — ABNORMAL HIGH (ref <117)

## 2015-02-11 DIAGNOSIS — M75122 Complete rotator cuff tear or rupture of left shoulder, not specified as traumatic: Secondary | ICD-10-CM | POA: Diagnosis not present

## 2015-02-13 ENCOUNTER — Other Ambulatory Visit: Payer: Self-pay | Admitting: Internal Medicine

## 2015-02-21 ENCOUNTER — Encounter: Payer: Self-pay | Admitting: *Deleted

## 2015-02-21 ENCOUNTER — Other Ambulatory Visit: Payer: Self-pay | Admitting: Internal Medicine

## 2015-03-17 ENCOUNTER — Encounter (HOSPITAL_BASED_OUTPATIENT_CLINIC_OR_DEPARTMENT_OTHER): Payer: Self-pay | Admitting: Emergency Medicine

## 2015-03-17 ENCOUNTER — Emergency Department (HOSPITAL_BASED_OUTPATIENT_CLINIC_OR_DEPARTMENT_OTHER): Payer: Medicare Other

## 2015-03-17 ENCOUNTER — Emergency Department (HOSPITAL_BASED_OUTPATIENT_CLINIC_OR_DEPARTMENT_OTHER)
Admission: EM | Admit: 2015-03-17 | Discharge: 2015-03-17 | Disposition: A | Discharge status: Home / Self Care | Payer: Medicare Other | Attending: Emergency Medicine | Admitting: Emergency Medicine

## 2015-03-17 ENCOUNTER — Other Ambulatory Visit: Payer: Self-pay | Admitting: *Deleted

## 2015-03-17 DIAGNOSIS — Z87438 Personal history of other diseases of male genital organs: Secondary | ICD-10-CM | POA: Diagnosis not present

## 2015-03-17 DIAGNOSIS — Y9367 Activity, basketball: Secondary | ICD-10-CM | POA: Diagnosis not present

## 2015-03-17 DIAGNOSIS — Z7982 Long term (current) use of aspirin: Secondary | ICD-10-CM | POA: Insufficient documentation

## 2015-03-17 DIAGNOSIS — K219 Gastro-esophageal reflux disease without esophagitis: Secondary | ICD-10-CM | POA: Diagnosis not present

## 2015-03-17 DIAGNOSIS — Z794 Long term (current) use of insulin: Principal | ICD-10-CM

## 2015-03-17 DIAGNOSIS — E559 Vitamin D deficiency, unspecified: Secondary | ICD-10-CM | POA: Diagnosis not present

## 2015-03-17 DIAGNOSIS — Z87891 Personal history of nicotine dependence: Secondary | ICD-10-CM | POA: Insufficient documentation

## 2015-03-17 DIAGNOSIS — Y998 Other external cause status: Secondary | ICD-10-CM | POA: Diagnosis not present

## 2015-03-17 DIAGNOSIS — Y92310 Basketball court as the place of occurrence of the external cause: Secondary | ICD-10-CM | POA: Insufficient documentation

## 2015-03-17 DIAGNOSIS — W010XXA Fall on same level from slipping, tripping and stumbling without subsequent striking against object, initial encounter: Secondary | ICD-10-CM | POA: Diagnosis not present

## 2015-03-17 DIAGNOSIS — Z79899 Other long term (current) drug therapy: Secondary | ICD-10-CM | POA: Diagnosis not present

## 2015-03-17 DIAGNOSIS — R0781 Pleurodynia: Secondary | ICD-10-CM | POA: Diagnosis not present

## 2015-03-17 DIAGNOSIS — E119 Type 2 diabetes mellitus without complications: Secondary | ICD-10-CM

## 2015-03-17 DIAGNOSIS — S29001A Unspecified injury of muscle and tendon of front wall of thorax, initial encounter: Secondary | ICD-10-CM | POA: Insufficient documentation

## 2015-03-17 MED ORDER — INSULIN PEN NEEDLE 32G X 4 MM MISC: Status: DC

## 2015-03-17 MED ORDER — IBUPROFEN 400 MG PO TABS: 600.0000 mg | ORAL_TABLET | Freq: Once | ORAL | Status: DC

## 2015-03-17 MED ORDER — ACETAMINOPHEN 500 MG PO TABS: 1000.0000 mg | ORAL_TABLET | Freq: Once | ORAL | Status: DC

## 2015-03-17 NOTE — ED Notes (Signed)
Patient ambulates to radiology department. 

## 2015-03-17 NOTE — ED Provider Notes (Signed)
CSN: [id number]     Arrival date & time 03/17/15  0865 History   First MD Initiated Contact with Patient 03/17/15 [phone]     Chief Complaint  Patient presents with  . Rib Injury     (Consider location/radiation/quality/duration/timing/severity/associated sxs/prior Treatment) Patient is a 81 y.o. male presenting with general illness. The history is provided by the patient.  Illness Severity:  Mild Onset quality:  Gradual Duration:  3 months Timing:  Constant Progression:  Unchanged Chronicity:  New Associated symptoms: myalgias   Associated symptoms: no abdominal pain, no chest pain, no congestion, no diarrhea, no fever, no headaches, no rash, no shortness of breath and no vomiting    81 yo M with a chief complaint of left-sided rib pain. This initially occurred when patient was playing with his grandkids and tripped over the bottom of a basketball goal. Larey Seat on his left side. He denies any other injury. Patient states that he has had persistent very mild pain to that area. Denies shortness of breath cough or fever. He is concerned because it has taken so long to be asymptomatic. He also has a surgery this coming up and does not want this to him..  Past Medical History  Diagnosis Date  . GERD (gastroesophageal reflux disease)   . BPH (benign prostatic hypertrophy)   . Rotator cuff tear, right RECURRENT  . Vitamin D deficiency    Past Surgical History  Procedure Laterality Date  . Right shoulder arthroscopy/ labral & biceps tendon debridement/ distal clavicle decompression/ sad/ mini open rotator cuff repair  03-09-2010  . Shoulder arthroscopy  04-10-2004    LEFT  . Lumbar microdiscectomy  04-06-1999    L4 - 5  . Shoulder open rotator cuff repair  06/09/2011    Procedure: ROTATOR CUFF REPAIR SHOULDER OPEN;  Surgeon: Drucilla Schmidt, MD;  Location: [facility] [place];  Service: Orthopedics;  Laterality: Right;  anterior acromionectomy repair right rotator cuff with tissue  mend    Family History  Problem Relation Age of Onset  . Heart disease Mother   . Heart disease Father   . Stroke Father   . Cancer Father   . Cancer Sister   . Diabetes Maternal Grandmother   . Hypertension Sister   . Colon cancer Neg Hx   . Pancreatic cancer Neg Hx   . Stomach cancer Neg Hx   . Rectal cancer Neg Hx    Social History  Substance Use Topics  . Smoking status: Former Smoker    Types: Cigarettes    Quit date: 06/03/1990  . Smokeless tobacco: Never Used  . Alcohol Use: No     Comment: OCCASIONAL , WINE-no longer drinks alcohol    Review of Systems  Constitutional: Negative for fever and chills.  HENT: Negative for congestion and facial swelling.   Eyes: Negative for discharge and visual disturbance.  Respiratory: Negative for shortness of breath.   Cardiovascular: Negative for chest pain and palpitations.  Gastrointestinal: Negative for vomiting, abdominal pain and diarrhea.  Musculoskeletal: Positive for myalgias and arthralgias.  Skin: Negative for color change and rash.  Neurological: Negative for tremors, syncope and headaches.  Psychiatric/Behavioral: Negative for confusion and dysphoric mood.      Allergies  Ace inhibitors and Beta adrenergic blockers  Home Medications   Prior to Admission medications   Medication Sig Start Date End Date Taking? Authorizing Provider  aspirin 81 MG tablet Take by mouth daily.     Historical Provider, MD  baclofen (LIORESAL)  10 MG tablet TAKE 1/2-1 TABLET BY MOUTH 2-3 TIMES A DAY AS NEEDED FOR MUSCLE SPASMS 02/19/14   Lucky Cowboy, MD  celecoxib (CELEBREX) 200 MG capsule TAKE 1 CAPSULE BY MOUTH DAILY FOR PAIN AND INFLAMMATION 02/21/15   Lucky Cowboy, MD  Cholecalciferol (VITAMIN D PO) Take 5,000 Units by mouth daily.    Historical Provider, MD  finasteride (PROSCAR) 5 MG tablet TAKE 1 TABLET BY MOUTH DAILY 02/13/15   Lucky Cowboy, MD  fish oil-omega-3 fatty acids 1000 MG capsule Take 1 g by mouth daily.      Historical Provider, MD  Insulin Pen Needle (BD PEN NEEDLE NANO U/F) 32G X 4 MM MISC INJECT 1 TIME DAILY FOR LANTUS 01/13/15   Lucky Cowboy, MD  labetalol (NORMODYNE) 100 MG tablet TAKE 1 TABLET BY MOUTH TWICE DAILY 12/31/14   Quentin Mulling, PA-C  LANTUS SOLOSTAR 100 UNIT/ML Solostar Pen USE AS DIRECTED 04/27/14   Lucky Cowboy, MD  levothyroxine (SYNTHROID, LEVOTHROID) 150 MCG tablet TAKE 1 TABLET BY MOUTH EVERY MORNING 12/31/14   Quentin Mulling, PA-C  losartan-hydrochlorothiazide (HYZAAR) 100-25 MG tablet TAKE 1 TABLET BY MOUTH EVERY DAY FOR HIGH BLOOD PRESSURE 01/10/15   Lucky Cowboy, MD  metFORMIN (GLUCOPHAGE-XR) 500 MG 24 hr tablet TAKE 4 TABLETS BY MOUTH EVERY DAY FOR DIABETES 11/28/14   Quentin Mulling, PA-C  minocycline (MINOCIN,DYNACIN) 100 MG capsule TAKE 1 CAPSULE BY MOUTH DAILY 12/31/14   Quentin Mulling, PA-C  Multiple Vitamin (MULTIVITAMIN) tablet Take 1 tablet by mouth daily.    Historical Provider, MD  pantoprazole (PROTONIX) 40 MG tablet TAKE 1 TABLET(40 MG) BY MOUTH DAILY 12/31/14   Quentin Mulling, PA-C  simvastatin (ZOCOR) 40 MG tablet TAKE 1 TABLET BY MOUTH EVERY NIGHT AT BEDTIME FOR CHOLESTEROL 09/05/13   Lucky Cowboy, MD  ZETIA 10 MG tablet TAKE 1 TABLET BY MOUTH EVERY DAY 05/09/14   Lucky Cowboy, MD   BP 187/79 mmHg  Pulse 79  Temp(Src) 98 F (36.7 C) (Oral)  Resp 18  Ht  (1.753 m)  Wt 167 lb (75.751 kg)  BMI 24.65 kg/m2  SpO2 100% Physical Exam  Constitutional: He is oriented to person, place, and time. He appears well-developed and well-nourished.  HENT:  Head: Normocephalic and atraumatic.  Eyes: EOM are normal. Pupils are equal, round, and reactive to light.  Neck: Normal range of motion. Neck supple. No JVD present.  Cardiovascular: Normal rate and regular rhythm.  Exam reveals no gallop and no friction rub.   No murmur heard. Pulmonary/Chest: No respiratory distress. He has no wheezes.  Abdominal: He exhibits no distension. There is no rebound  and no guarding.  Musculoskeletal: Normal range of motion. He exhibits tenderness (Very mild tenderness to the left coastal rib margin about the midclavicular line. ).  Neurological: He is alert and oriented to person, place, and time.  Skin: No rash noted. No pallor.  Psychiatric: He has a normal mood and affect. His behavior is normal.  Nursing note and vitals reviewed.   ED Course  Procedures (including critical care time) Labs Review Labs Reviewed - No data to display  Imaging Review Dg Ribs Unilateral W/chest Left  03/17/2015  CLINICAL DATA:  Left-sided rib pain.  Fall playing basketball. EXAM: LEFT RIBS AND CHEST - 3+ VIEW COMPARISON:  03/09/2010 FINDINGS: No visible rib fracture. Lungs are clear. No effusion or pneumothorax. Heart is normal size. Calcifications projecting over the left upper quadrant and midline of the upper abdomen likely reflect granulomas in the liver and  spleen. IMPRESSION: No visible rib fracture. No active cardiopulmonary disease Electronically Signed   By: Charlett Nose M.D.   On: 03/17/2015 10:20   I have personally reviewed and evaluated these images and lab results as part of my medical decision-making.   EKG Interpretation   Date/Time:  Monday March 17 2015 10:23:30 EST Ventricular Rate:  58 PR Interval:  301 QRS Duration: 96 QT Interval:  407 QTC Calculation: 400 R Axis:   -6 Text Interpretation:  Sinus rhythm Prolonged PR interval Baseline wander  in lead(s) V1 Otherwise no significant change Confirmed by Lucy Woolever MD,  Reuel Boom ([id number]) on 03/17/2015 12:35:44 PM      MDM   Final diagnoses:  Rib pain on left side    81 yo M with a chief complaint of left-sided rib pain. Had an unremarkable EKG chest x-ray was negative. Discharge home.  12:34 PM:  I have discussed the diagnosis/risks/treatment options with the patient and believe the pt to be eligible for discharge home to follow-up with PCP. We also discussed returning to the ED immediately if  new or worsening sx occur. We discussed the sx which are most concerning (e.g., sudden worsening pain, fever, inability to tolerate by mouth) that necessitate immediate return. Medications administered to the patient during their visit and any new prescriptions provided to the patient are listed below.  Medications given during this visit Medications  acetaminophen (TYLENOL) tablet 1,000 mg (1,000 mg Oral Not Given 03/17/15 1041)  ibuprofen (ADVIL,MOTRIN) tablet 600 mg (600 mg Oral Not Given 03/17/15 1041)    Discharge Medication List as of 03/17/2015 10:36 AM      The patient appears reasonably screen and/or stabilized for discharge and I doubt any other medical condition or other [facility] requiring further screening, evaluation, or treatment in the ED at this time prior to discharge.      Melene Plan, DO 03/17/15 1235

## 2015-03-17 NOTE — Discharge Instructions (Signed)
Chest Wall Pain °Chest wall pain is pain in or around the bones and muscles of your chest. Sometimes, an injury causes this pain. Sometimes, the cause may not be known. This pain may take several weeks or longer to get better. °HOME CARE °Pay attention to any changes in your symptoms. Take these actions to help with your pain: °· Rest as told by your doctor. °· Avoid activities that cause pain. Try not to use your chest, belly (abdominal), or side muscles to lift heavy things. °· If directed, apply ice to the painful area: °¨ Put ice in a plastic bag. °¨ Place a towel between your skin and the bag. °¨ Leave the ice on for 20 minutes, 2-3 times per day. °· Take over-the-counter and prescription medicines only as told by your doctor. °· Do not use tobacco products, including cigarettes, chewing tobacco, and e-cigarettes. If you need help quitting, ask your doctor. °· Keep all follow-up visits as told by your doctor. This is important. °GET HELP IF: °· You have a fever. °· Your chest pain gets worse. °· You have new symptoms. °GET HELP RIGHT AWAY IF: °· You feel sick to your stomach (nauseous) or you throw up (vomit). °· You feel sweaty or light-headed. °· You have a cough with phlegm (sputum) or you cough up blood. °· You are short of breath. °  °This information is not intended to replace advice given to you by your health care provider. Make sure you discuss any questions you have with your health care provider. °  °Document Released: 07/28/2007 Document Revised: 10/30/2014 Document Reviewed: 05/06/2014 °Elsevier Interactive Patient Education ©2016 [employer] ° °

## 2015-03-17 NOTE — ED Notes (Signed)
MD at bedside. 

## 2015-03-17 NOTE — ED Notes (Signed)
Pt fell onto his left ribs after christmas.  Pt states it has been bothering him ever since and wanted to have it looked at.  Denies sob or chest pain.

## 2015-03-21 ENCOUNTER — Other Ambulatory Visit: Payer: Self-pay | Admitting: Physician Assistant

## 2015-03-25 ENCOUNTER — Other Ambulatory Visit: Payer: Self-pay | Admitting: Physician Assistant

## 2015-03-29 DIAGNOSIS — M75102 Unspecified rotator cuff tear or rupture of left shoulder, not specified as traumatic: Secondary | ICD-10-CM | POA: Diagnosis not present

## 2015-03-29 DIAGNOSIS — M75122 Complete rotator cuff tear or rupture of left shoulder, not specified as traumatic: Secondary | ICD-10-CM | POA: Diagnosis not present

## 2015-04-03 DIAGNOSIS — I10 Essential (primary) hypertension: Secondary | ICD-10-CM | POA: Diagnosis not present

## 2015-04-03 DIAGNOSIS — E119 Type 2 diabetes mellitus without complications: Secondary | ICD-10-CM | POA: Diagnosis not present

## 2015-04-03 DIAGNOSIS — E039 Hypothyroidism, unspecified: Secondary | ICD-10-CM | POA: Diagnosis not present

## 2015-04-03 DIAGNOSIS — Z87891 Personal history of nicotine dependence: Secondary | ICD-10-CM | POA: Diagnosis not present

## 2015-04-03 DIAGNOSIS — Z794 Long term (current) use of insulin: Secondary | ICD-10-CM | POA: Diagnosis not present

## 2015-04-03 DIAGNOSIS — Z79899 Other long term (current) drug therapy: Secondary | ICD-10-CM | POA: Diagnosis not present

## 2015-04-03 DIAGNOSIS — E785 Hyperlipidemia, unspecified: Secondary | ICD-10-CM | POA: Diagnosis not present

## 2015-04-03 DIAGNOSIS — M75122 Complete rotator cuff tear or rupture of left shoulder, not specified as traumatic: Secondary | ICD-10-CM | POA: Diagnosis not present

## 2015-04-09 ENCOUNTER — Ambulatory Visit (INDEPENDENT_AMBULATORY_CARE_PROVIDER_SITE_OTHER): Payer: Medicare Other | Admitting: Internal Medicine

## 2015-04-09 ENCOUNTER — Encounter: Payer: Self-pay | Admitting: Internal Medicine

## 2015-04-09 ENCOUNTER — Ambulatory Visit (HOSPITAL_COMMUNITY)
Admission: RE | Admit: 2015-04-09 | Discharge: 2015-04-09 | Disposition: A | Discharge status: Home / Self Care | Payer: Medicare Other | Source: Ambulatory Visit | Attending: Internal Medicine | Admitting: Internal Medicine

## 2015-04-09 VITALS — BP 155/68 | HR 62 | Temp 98.2°F | Resp 16 | Ht 69.5 in | Wt 164.0 lb

## 2015-04-09 DIAGNOSIS — S20212A Contusion of left front wall of thorax, initial encounter: Secondary | ICD-10-CM | POA: Insufficient documentation

## 2015-04-09 DIAGNOSIS — R079 Chest pain, unspecified: Secondary | ICD-10-CM | POA: Diagnosis not present

## 2015-04-09 DIAGNOSIS — X58XXXA Exposure to other specified factors, initial encounter: Secondary | ICD-10-CM | POA: Insufficient documentation

## 2015-04-09 MED ORDER — TRAMADOL HCL 50 MG PO TABS: 50.0000 mg | ORAL_TABLET | Freq: Four times a day (QID) | ORAL | Status: DC | PRN

## 2015-04-09 NOTE — Patient Instructions (Signed)
Rib Contusion A rib contusion is a deep bruise on your rib area. Contusions are the result of a blunt trauma that causes bleeding and injury to the tissues under the skin. A rib contusion may involve bruising of the ribs and of the skin and muscles in the area. The skin overlying the contusion may turn blue, purple, or yellow. Minor injuries will give you a painless contusion, but more severe contusions may stay painful and swollen for a few weeks. CAUSES  A contusion is usually caused by a blow, trauma, or direct force to an area of the body. This often occurs while playing contact sports. SYMPTOMS  Swelling and redness of the injured area.  Discoloration of the injured area.  Tenderness and soreness of the injured area.  Pain with or without movement. DIAGNOSIS  The diagnosis can be made by taking a medical history and performing a physical exam. An X-ray, CT scan, or MRI may be needed to determine if there were any associated injuries, such as broken bones (fractures) or internal injuries. TREATMENT  Often, the best treatment for a rib contusion is rest. Icing or applying cold compresses to the injured area may help reduce swelling and inflammation. Deep breathing exercises may be recommended to reduce the risk of partial lung collapse and pneumonia. Over-the-counter or prescription medicines may also be recommended for pain control. HOME CARE INSTRUCTIONS   Apply ice to the injured area:  Put ice in a plastic bag.  Place a towel between your skin and the bag.  Leave the ice on for 20 minutes, 2-3 times per day.  Take medicines only as directed by your health care provider.  Rest the injured area. Avoid strenuous activity and any activities or movements that cause pain. Be careful during activities and avoid bumping the injured area.  Perform deep-breathing exercises as directed by your health care provider.  Do not lift anything that is heavier than 5 lb (2.3 kg) until your  health care provider approves.  Do not use any tobacco products, including cigarettes, chewing tobacco, or electronic cigarettes. If you need help quitting, ask your health care provider. SEEK MEDICAL CARE IF:   You have increased bruising or swelling.  You have pain that is not controlled with treatment.  You have a fever. SEEK IMMEDIATE MEDICAL CARE IF:   You have difficulty breathing or shortness of breath.  You develop a continual cough, or you cough up thick or bloody sputum.  You feel sick to your stomach (nauseous), you throw up (vomit), or you have abdominal pain.   This information is not intended to replace advice given to you by your health care provider. Make sure you discuss any questions you have with your health care provider.   Document Released: 11/03/2000 Document Revised: 03/01/2014 Document Reviewed: 11/20/2013 Elsevier Interactive Patient Education 2016 [employer] Inc[employer]  

## 2015-04-09 NOTE — Progress Notes (Signed)
   Subjective:    Patient ID: Gerald [name], male    DOB: [birth year], 81 y.o.   MRN: [id number]  Fall Pertinent negatives include no abdominal pain, nausea or vomiting.  Patient presents to the office for evaluation of left sided chest pain after a fall on 03/17/15.  He reports that he fell over a basketball goal.  He did go to the ER and he did have a normal xray.  He has had some soreness which has continued to bother him since the fall.  He is taking some motrin but he reports that he hasn't gotten much relief.  He reports that he has not bruising.      Review of Systems  Constitutional: Negative for chills.  Respiratory: Positive for shortness of breath. Negative for cough, chest tightness and wheezing.   Gastrointestinal: Negative for nausea, vomiting, abdominal pain, diarrhea and constipation.       Objective:   Physical Exam  Constitutional: He is oriented to person, place, and time. He appears well-developed and well-nourished. No distress.  HENT:  Head: Normocephalic.  Mouth/Throat: Oropharynx is clear and moist. No oropharyngeal exudate.  Eyes: Conjunctivae are normal. No scleral icterus.  Neck: Normal range of motion. Neck supple. No JVD present. No thyromegaly present.  Cardiovascular: Normal rate, regular rhythm, normal heart sounds and intact distal pulses.  Exam reveals no gallop and no friction rub.   No murmur heard. Pulmonary/Chest: Effort normal and breath sounds normal. No respiratory distress. He has no wheezes. He has no rales. Chest wall is not dull to percussion. He exhibits tenderness. He exhibits no mass, no bony tenderness, no laceration, no crepitus, no edema, no deformity, no swelling and no retraction.    Abdominal: Soft. Bowel sounds are normal. He exhibits no distension and no mass. There is no tenderness. There is no rebound and no guarding.  Musculoskeletal: Normal range of motion.  Lymphadenopathy:    He has no cervical adenopathy.  Neurological:  He is alert and oriented to person, place, and time.  Skin: Skin is warm and dry. He is not diaphoretic.  Psychiatric: He has a normal mood and affect. His behavior is normal. Judgment and thought content normal.  Nursing note and vitals reviewed.   Filed Vitals:   04/09/15 1005  BP: 184/80  Pulse: 62  Temp: 98.2 F (36.8 C)  Resp: 16          Assessment & Plan:    1. Rib contusion, left, initial encounter  - traMADol (ULTRAM) 50 MG tablet; Take 1 tablet (50 mg total) by mouth every 6 (six) hours as needed for moderate pain.  Dispense: 90 tablet; Refill: 0 - DG Chest 2 View; Future

## 2015-04-23 ENCOUNTER — Encounter: Payer: Self-pay | Admitting: Internal Medicine

## 2015-04-23 ENCOUNTER — Ambulatory Visit (INDEPENDENT_AMBULATORY_CARE_PROVIDER_SITE_OTHER): Payer: Medicare Other | Admitting: Internal Medicine

## 2015-04-23 VITALS — BP 160/74 | HR 60 | Temp 98.2°F | Resp 16 | Ht 69.5 in | Wt 163.0 lb

## 2015-04-23 DIAGNOSIS — M5432 Sciatica, left side: Secondary | ICD-10-CM

## 2015-04-23 MED ORDER — PREDNISONE 20 MG PO TABS: ORAL_TABLET | ORAL | Status: DC

## 2015-04-23 NOTE — Progress Notes (Signed)
   Subjective:    Patient ID: Gerald [name], male    DOB: 1934/06/15, 81 y.o.   MRN: [id number]  Leg Pain   Foot Pain Associated symptoms include myalgias. Pertinent negatives include no arthralgias, chills, fatigue, fever, joint swelling or rash.   Patient presents to the office for evaluation of left leg pain x 3 weeks.  He reports that the pain is intermittent.  He has noticed that sometimes it hurts and sometimes it is fine.  He has no injury that he can think of.  He reports that the pain is burning and is moderate to severe.  The pain is exacerbated by movement sometimes.  He reports that he doesn't get shooting sensations.  He has never had something similar happen.   Review of Systems  Constitutional: Negative for fever, chills and fatigue.  Musculoskeletal: Positive for myalgias. Negative for joint swelling and arthralgias.  Skin: Negative for color change and rash.       Objective:   Physical Exam  Constitutional: He is oriented to person, place, and time. He appears well-developed and well-nourished. No distress.  HENT:  Head: Normocephalic.  Mouth/Throat: No oropharyngeal exudate.  Eyes: Conjunctivae are normal. No scleral icterus.  Neck: Normal range of motion. Neck supple. No JVD present. No thyromegaly present.  Cardiovascular: Normal rate, regular rhythm, normal heart sounds and intact distal pulses.  Exam reveals no gallop and no friction rub.   No murmur heard. Pulmonary/Chest: Effort normal and breath sounds normal. No respiratory distress. He has no wheezes. He has no rales. He exhibits no tenderness.  Musculoskeletal:       Left hip: Normal.       Left knee: Normal.       Left ankle: Normal. Achilles tendon normal.       Lumbar back: Normal. He exhibits normal range of motion, no tenderness and no bony tenderness.  Patient rises slowly from sitting to standing.  They walk without an antalgic gait.  There is no evidence of erythema, ecchymosis, or gross  deformity.  There is no tenderness to palpation.  Active ROM is full.  Sensation to light touch is intact over all extremities.  Strength is symmetric and equal in all extremities.  Negative left straight leg raise  Lymphadenopathy:    He has no cervical adenopathy.  Neurological: He is alert and oriented to person, place, and time.  Skin: Skin is warm and dry. He is not diaphoretic.  Psychiatric: He has a normal mood and affect. His behavior is normal. Judgment and thought content normal.  Nursing note and vitals reviewed.   Filed Vitals:   04/23/15 0925  BP: 160/74  Pulse: 60  Temp: 98.2 F (36.8 C)  Resp: 16         Assessment & Plan:    1. Sciatica of left side -exam WNL -no evidence of DVT -given history likely sciatica -try prednisone taper -follow-up prn

## 2015-04-23 NOTE — Patient Instructions (Signed)

## 2015-05-05 ENCOUNTER — Encounter: Payer: Self-pay | Admitting: *Deleted

## 2015-05-12 ENCOUNTER — Ambulatory Visit (INDEPENDENT_AMBULATORY_CARE_PROVIDER_SITE_OTHER): Payer: Medicare Other | Admitting: Internal Medicine

## 2015-05-12 ENCOUNTER — Encounter: Payer: Self-pay | Admitting: Internal Medicine

## 2015-05-12 VITALS — BP 144/74 | HR 60 | Temp 97.8°F | Resp 16 | Ht 69.5 in | Wt 160.6 lb

## 2015-05-12 DIAGNOSIS — E119 Type 2 diabetes mellitus without complications: Secondary | ICD-10-CM | POA: Diagnosis not present

## 2015-05-12 DIAGNOSIS — K219 Gastro-esophageal reflux disease without esophagitis: Secondary | ICD-10-CM

## 2015-05-12 DIAGNOSIS — Z79899 Other long term (current) drug therapy: Secondary | ICD-10-CM | POA: Diagnosis not present

## 2015-05-12 DIAGNOSIS — E559 Vitamin D deficiency, unspecified: Secondary | ICD-10-CM

## 2015-05-12 DIAGNOSIS — E039 Hypothyroidism, unspecified: Secondary | ICD-10-CM

## 2015-05-12 DIAGNOSIS — Z794 Long term (current) use of insulin: Secondary | ICD-10-CM | POA: Diagnosis not present

## 2015-05-12 DIAGNOSIS — E1129 Type 2 diabetes mellitus with other diabetic kidney complication: Secondary | ICD-10-CM | POA: Diagnosis not present

## 2015-05-12 DIAGNOSIS — I10 Essential (primary) hypertension: Secondary | ICD-10-CM

## 2015-05-12 DIAGNOSIS — E782 Mixed hyperlipidemia: Secondary | ICD-10-CM | POA: Diagnosis not present

## 2015-05-12 DIAGNOSIS — N182 Chronic kidney disease, stage 2 (mild): Secondary | ICD-10-CM | POA: Diagnosis not present

## 2015-05-12 DIAGNOSIS — E1122 Type 2 diabetes mellitus with diabetic chronic kidney disease: Secondary | ICD-10-CM | POA: Diagnosis not present

## 2015-05-12 LAB — CBC WITH DIFFERENTIAL/PLATELET
BASOS PCT: 0 % (ref 0–1)
Basophils Absolute: 0 10*3/uL (ref 0.0–0.1)
EOS ABS: 0.2 10*3/uL (ref 0.0–0.7)
EOS PCT: 4 % (ref 0–5)
HCT: 39.7 % (ref 39.0–52.0)
Hemoglobin: 13.8 g/dL (ref 13.0–17.0)
LYMPHS ABS: 1.2 10*3/uL (ref 0.7–4.0)
Lymphocytes Relative: 23 % (ref 12–46)
MCH: 31.4 pg (ref 26.0–34.0)
MCHC: 34.8 g/dL (ref 30.0–36.0)
MCV: 90.4 fL (ref 78.0–100.0)
MONO ABS: 0.5 10*3/uL (ref 0.1–1.0)
MONOS PCT: 9 % (ref 3–12)
MPV: 9.1 fL (ref 8.6–12.4)
Neutro Abs: 3.5 10*3/uL (ref 1.7–7.7)
Neutrophils Relative %: 64 % (ref 43–77)
PLATELETS: 115 10*3/uL — AB (ref 150–400)
RBC: 4.39 MIL/uL (ref 4.22–5.81)
RDW: 13.8 % (ref 11.5–15.5)
WBC: 5.4 10*3/uL (ref 4.0–10.5)

## 2015-05-12 LAB — HEPATIC FUNCTION PANEL
ALBUMIN: 3.9 g/dL (ref 3.6–5.1)
ALK PHOS: 47 U/L (ref 40–115)
ALT: 36 U/L (ref 9–46)
AST: 31 U/L (ref 10–35)
BILIRUBIN DIRECT: 0.1 mg/dL (ref <=0.2)
BILIRUBIN TOTAL: 0.4 mg/dL (ref 0.2–1.2)
Indirect Bilirubin: 0.3 mg/dL (ref 0.2–1.2)
Total Protein: 6 g/dL — ABNORMAL LOW (ref 6.1–8.1)

## 2015-05-12 LAB — BASIC METABOLIC PANEL WITH GFR
BUN: 20 mg/dL (ref 7–25)
CALCIUM: 9.1 mg/dL (ref 8.6–10.3)
CO2: 24 mmol/L (ref 20–31)
CREATININE: 1.09 mg/dL (ref 0.70–1.18)
Chloride: 104 mmol/L (ref 98–110)
GFR, Est African American: 75 mL/min (ref >=60)
GFR, Est Non African American: 65 mL/min (ref >=60)
Glucose, Bld: 138 mg/dL — ABNORMAL HIGH (ref 65–99)
Potassium: 4.1 mmol/L (ref 3.5–5.3)
SODIUM: 139 mmol/L (ref 135–146)

## 2015-05-12 LAB — LIPID PANEL
CHOL/HDL RATIO: 3.2 ratio (ref <=5.0)
Cholesterol: 136 mg/dL (ref 125–200)
HDL: 43 mg/dL (ref >=40)
LDL CALC: 62 mg/dL (ref <130)
Triglycerides: 157 mg/dL — ABNORMAL HIGH (ref <150)
VLDL: 31 mg/dL — ABNORMAL HIGH (ref <30)

## 2015-05-12 LAB — TSH: TSH: 3.22 m[IU]/L (ref 0.40–4.50)

## 2015-05-12 LAB — HEMOGLOBIN A1C
Hgb A1c MFr Bld: 7.4 % — ABNORMAL HIGH (ref <5.7)
Mean Plasma Glucose: 166 mg/dL — ABNORMAL HIGH (ref <117)

## 2015-05-12 LAB — MAGNESIUM: Magnesium: 1.7 mg/dL (ref 1.5–2.5)

## 2015-05-12 MED ORDER — ATENOLOL 100 MG PO TABS: ORAL_TABLET | ORAL | Status: DC

## 2015-05-12 NOTE — Patient Instructions (Signed)

## 2015-05-12 NOTE — Progress Notes (Signed)
Patient ID: Gerald [name], male   DOB: [birth year], 81 y.o.   MRN: [id number]   This very nice 81 y.o. MWM presents for 3 month follow up with Hypertension, Hyperlipidemia, Pre-Diabetes and Vitamin D Deficiency. Patient has GERD controlled w/diet & meds.   Patient is treated for HTN since 1989 and he has increeased his Labetalol to try to control his BP's. Today's BP is 144/74. Patient has had no complaints of any cardiac type chest pain, palpitations, dyspnea/orthopnea/PND, dizziness, claudication, or dependent edema.   Hyperlipidemia is controlled with diet & meds. Patient denies myalgias or other med SE's. Last Lipids were at goal with Cholesterol 117*; HDL 40; LDL 55; Triglycerides 112 on 02/07/2015.    Also, the patient has history of T2_NIDDM w/CKD 2 predating since 1999 and has had no symptoms of reactive hypoglycemia, diabetic polys, paresthesias or visual blurring.  Last A1c was not at goal 7.1% on 02/07/2015.   Further, the patient also has history of Vitamin D Deficiency and supplements vitamin D without any suspected side-effects. Last vitamin D was 68 on 11/01/2014.  Medication Sig  . aspirin 81 MG  Take by mouth daily.   . baclofen10 MG TAKE 1/2-1 TABLET BY MOUTH 2-3 TIMES A DAY AS NEEDED FOR MUSCLE SPASMS  . celecoxib200 MG  TAKE 1 CAPSULE BY MOUTH DAILY FOR PAIN AND INFLAMMATION  . VITAMIN D  Take 5,000 Units by mouth daily.  . Finasteride 5 MG  TAKE 1 TABLET BY MOUTH DAILY  . fish oil-omega-3 1000 MG  Take 1 g by mouth daily.   Marland Kitchen labetalol  100 MG  TAKE 1 TAB TWICE DAILY- Taking 4 &1/2 tablets /daily  . LANTUS SOLOSTAR  USE AS DIRECTED - Currently 35 u qam & 16 u qpm  . Levothyroxine 150 MCG  TAKE 1 TABLET BY MOUTH EVERY MORNING  . losartan-hctz 100-25  TAKE 1 TABLET BY MOUTH EVERY DAY FOR HIGH BLOOD PRESSURE  . metFORMIN-XR 500 MG TAKE 4 TABLETS BY MOUTH EVERY DAY FOR DIABETES  . minocycline  100 MG  TAKE 1 CAPSULE BY MOUTH DAILY  . Multiple Vitamin  Take 1 tablet by mouth  daily.  . Pantoprazole 40 MG  TAKE 1 TABLET(40 MG) BY MOUTH DAILY  . simvastatin 40 MG TAKE 1 TABLET BY MOUTH EVERY NIGHT AT BEDTIME FOR CHOLESTEROL  . traMADol  50 MG  Take 1 tablet (50 mg total) by mouth every 6 (six) hours as needed for moderate pain.  Marland Kitchen ZETIA 10 MG TAKE 1 TABLET BY MOUTH EVERY DAY   Allergies  Allergen Reactions  . Ace Inhibitors    PMHx:   Past Medical History  Diagnosis Date  . GERD (gastroesophageal reflux disease)   . BPH (benign prostatic hypertrophy)   . Rotator cuff tear, right RECURRENT  . Vitamin D deficiency    Immunization History  Administered Date(s) Administered  . DT 11/01/2014  . Influenza Whole 11/10/2012  . Influenza, High Dose Seasonal PF 12/03/2013, 11/01/2014  . Pneumococcal Conjugate-13 12/03/2013  . Pneumococcal Polysaccharide-23 07/27/2011  . Tdap 02/23/2004  . Zoster 07/15/2005   Past Surgical History  Procedure Laterality Date  . Right shoulder arthroscopy/ labral & biceps tendon debridement/ distal clavicle decompression/ sad/ mini open rotator cuff repair  03-09-2010  . Shoulder arthroscopy  04-10-2004    LEFT  . Lumbar microdiscectomy  04-06-1999    L4 - 5  . Shoulder open rotator cuff repair  06/09/2011    Procedure: ROTATOR CUFF REPAIR SHOULDER OPEN;  Surgeon: Drucilla SchmidtJames P Aplington, MD;  [facility] [place];  Service: Orthopedics;  Laterality: Right;  anterior acromionectomy repair right rotator cuff with tissue mend    FHx:    Reviewed / unchanged  SHx:    Reviewed / unchanged  Systems Review:  Constitutional: Denies fever, chills, wt changes, headaches, insomnia, fatigue, night sweats, change in appetite. Eyes: Denies redness, blurred vision, diplopia, discharge, itchy, watery eyes.  ENT: Denies discharge, congestion, post nasal drip, epistaxis, sore throat, earache, hearing loss, dental pain, tinnitus, vertigo, sinus pain, snoring.  CV: Denies chest pain, palpitations, irregular heartbeat, syncope,  dyspnea, diaphoresis, orthopnea, PND, claudication or edema. Respiratory: denies cough, dyspnea, DOE, pleurisy, hoarseness, laryngitis, wheezing.  Gastrointestinal: Denies dysphagia, odynophagia, heartburn, reflux, water brash, abdominal pain or cramps, nausea, vomiting, bloating, diarrhea, constipation, hematemesis, melena, hematochezia  or hemorrhoids. Genitourinary: Denies dysuria, frequency, urgency, nocturia, hesitancy, discharge, hematuria or flank pain. Musculoskeletal: Denies arthralgias, myalgias, stiffness, jt. swelling, pain, limping or strain/sprain.  Skin: Denies pruritus, rash, hives, warts, acne, eczema or change in skin lesion(s). Neuro: No weakness, tremor, incoordination, spasms, paresthesia or pain. Psychiatric: Denies confusion, memory loss or sensory loss. Endo: Denies change in weight, skin or hair change.  Heme/Lymph: No excessive bleeding, bruising or enlarged lymph nodes.  Physical Exam  BP 144/74 mmHg  Pulse 60  Temp(Src) 97.8 F (36.6 C)  Resp 16  Ht 5' 9.5" (1.765 m)  Wt 160 lb 9.6 oz (72.848 kg)  BMI 23.38 kg/m2  Appears well nourished and in no distress Eyes: PERRLA, EOMs, conjunctiva no swelling or erythema. Sinuses: No frontal/maxillary tenderness ENT/Mouth: EAC's clear, TM's nl w/o erythema, bulging. Nares clear w/o erythema, swelling, exudates. Oropharynx clear without erythema or exudates. Oral hygiene is good. Tongue normal, non obstructing. Hearing intact.  Neck: Supple. Thyroid nl. Car 2+/2+ without bruits, nodes or JVD. Chest: Respirations nl with BS clear & equal w/o rales, rhonchi, wheezing or stridor.  Cor: Heart sounds normal w/ regular rate and rhythm without sig. murmurs, gallops, clicks, or rubs. Peripheral pulses normal and equal  without edema.  Abdomen: Soft & bowel sounds normal. Non-tender w/o guarding, rebound, hernias, masses, or organomegaly.  Lymphatics: Unremarkable.  Musculoskeletal: Full ROM all peripheral extremities, joint  stability, 5/5 strength, and normal gait.  Skin: Warm, dry without exposed rashes, lesions or ecchymosis apparent.  Neuro: Cranial nerves intact, reflexes equal bilaterally. Sensory-motor testing grossly intact. Tendon reflexes grossly intact.  Pysch: Alert & oriented x 3.  Insight and judgement nl & appropriate. No ideations.  Assessment and Plan:  1. Essential hypertension  - TSH  - D/C Labetalol & switch to Atenolol 100 mg daily and advised continue frequent BP checks  2. Mixed hyperlipidemia  - Lipid panel - TSH  3. CKD stage 2 due to type 2 diabetes mellitus (HCC)  - Hemoglobin A1c  4. Vitamin D deficiency  - VITAMIN D 25 Hydroxy  5. Insulin Dependent T2_IDDM   6. Hypothyroidism  - TSH  7. Gastroesophageal reflux disease   8. Medication management  - CBC with Differential/Platelet - BASIC METABOLIC PANEL WITH GFR - Hepatic function panel - Magnesium   Recommended regular exercise, BP monitoring, weight control, and discussed med and SE's. Recommended labs to assess and monitor clinical status. Further disposition pending results of labs. Over 30 minutes of exam, counseling, chart review was performed

## 2015-05-13 LAB — VITAMIN D 25 HYDROXY (VIT D DEFICIENCY, FRACTURES): Vit D, 25-Hydroxy: 70 ng/mL (ref 30–100)

## 2015-05-13 LAB — INSULIN, RANDOM: Insulin: 32.4 u[IU]/mL — ABNORMAL HIGH (ref 2.0–19.6)

## 2015-05-16 ENCOUNTER — Other Ambulatory Visit: Payer: Self-pay | Admitting: Internal Medicine

## 2015-05-16 ENCOUNTER — Other Ambulatory Visit: Payer: Self-pay

## 2015-05-16 MED ORDER — INSULIN PEN NEEDLE 32G X 4 MM MISC: Status: DC

## 2015-05-20 ENCOUNTER — Other Ambulatory Visit: Payer: Self-pay | Admitting: Internal Medicine

## 2015-05-21 DIAGNOSIS — M9904 Segmental and somatic dysfunction of sacral region: Secondary | ICD-10-CM | POA: Diagnosis not present

## 2015-05-21 DIAGNOSIS — M9905 Segmental and somatic dysfunction of pelvic region: Secondary | ICD-10-CM | POA: Diagnosis not present

## 2015-05-21 DIAGNOSIS — M5136 Other intervertebral disc degeneration, lumbar region: Secondary | ICD-10-CM | POA: Diagnosis not present

## 2015-05-21 DIAGNOSIS — M9903 Segmental and somatic dysfunction of lumbar region: Secondary | ICD-10-CM | POA: Diagnosis not present

## 2015-05-23 DIAGNOSIS — M9903 Segmental and somatic dysfunction of lumbar region: Secondary | ICD-10-CM | POA: Diagnosis not present

## 2015-05-23 DIAGNOSIS — M9905 Segmental and somatic dysfunction of pelvic region: Secondary | ICD-10-CM | POA: Diagnosis not present

## 2015-05-23 DIAGNOSIS — M9904 Segmental and somatic dysfunction of sacral region: Secondary | ICD-10-CM | POA: Diagnosis not present

## 2015-05-23 DIAGNOSIS — M5136 Other intervertebral disc degeneration, lumbar region: Secondary | ICD-10-CM | POA: Diagnosis not present

## 2015-05-26 DIAGNOSIS — M9904 Segmental and somatic dysfunction of sacral region: Secondary | ICD-10-CM | POA: Diagnosis not present

## 2015-05-26 DIAGNOSIS — M9905 Segmental and somatic dysfunction of pelvic region: Secondary | ICD-10-CM | POA: Diagnosis not present

## 2015-05-26 DIAGNOSIS — M5136 Other intervertebral disc degeneration, lumbar region: Secondary | ICD-10-CM | POA: Diagnosis not present

## 2015-05-26 DIAGNOSIS — M9903 Segmental and somatic dysfunction of lumbar region: Secondary | ICD-10-CM | POA: Diagnosis not present

## 2015-05-27 DIAGNOSIS — M9904 Segmental and somatic dysfunction of sacral region: Secondary | ICD-10-CM | POA: Diagnosis not present

## 2015-05-27 DIAGNOSIS — M5136 Other intervertebral disc degeneration, lumbar region: Secondary | ICD-10-CM | POA: Diagnosis not present

## 2015-05-27 DIAGNOSIS — M9903 Segmental and somatic dysfunction of lumbar region: Secondary | ICD-10-CM | POA: Diagnosis not present

## 2015-05-27 DIAGNOSIS — M9905 Segmental and somatic dysfunction of pelvic region: Secondary | ICD-10-CM | POA: Diagnosis not present

## 2015-05-28 ENCOUNTER — Other Ambulatory Visit: Payer: Self-pay | Admitting: Physician Assistant

## 2015-05-28 DIAGNOSIS — M9904 Segmental and somatic dysfunction of sacral region: Secondary | ICD-10-CM | POA: Diagnosis not present

## 2015-05-28 DIAGNOSIS — M9903 Segmental and somatic dysfunction of lumbar region: Secondary | ICD-10-CM | POA: Diagnosis not present

## 2015-05-28 DIAGNOSIS — M5136 Other intervertebral disc degeneration, lumbar region: Secondary | ICD-10-CM | POA: Diagnosis not present

## 2015-05-28 DIAGNOSIS — M9905 Segmental and somatic dysfunction of pelvic region: Secondary | ICD-10-CM | POA: Diagnosis not present

## 2015-05-29 DIAGNOSIS — M9903 Segmental and somatic dysfunction of lumbar region: Secondary | ICD-10-CM | POA: Diagnosis not present

## 2015-05-29 DIAGNOSIS — M9905 Segmental and somatic dysfunction of pelvic region: Secondary | ICD-10-CM | POA: Diagnosis not present

## 2015-05-29 DIAGNOSIS — M9904 Segmental and somatic dysfunction of sacral region: Secondary | ICD-10-CM | POA: Diagnosis not present

## 2015-05-29 DIAGNOSIS — M5136 Other intervertebral disc degeneration, lumbar region: Secondary | ICD-10-CM | POA: Diagnosis not present

## 2015-05-30 DIAGNOSIS — M9904 Segmental and somatic dysfunction of sacral region: Secondary | ICD-10-CM | POA: Diagnosis not present

## 2015-05-30 DIAGNOSIS — M5136 Other intervertebral disc degeneration, lumbar region: Secondary | ICD-10-CM | POA: Diagnosis not present

## 2015-05-30 DIAGNOSIS — M9905 Segmental and somatic dysfunction of pelvic region: Secondary | ICD-10-CM | POA: Diagnosis not present

## 2015-05-30 DIAGNOSIS — M9903 Segmental and somatic dysfunction of lumbar region: Secondary | ICD-10-CM | POA: Diagnosis not present

## 2015-06-02 DIAGNOSIS — M5136 Other intervertebral disc degeneration, lumbar region: Secondary | ICD-10-CM | POA: Diagnosis not present

## 2015-06-02 DIAGNOSIS — M9905 Segmental and somatic dysfunction of pelvic region: Secondary | ICD-10-CM | POA: Diagnosis not present

## 2015-06-02 DIAGNOSIS — M9904 Segmental and somatic dysfunction of sacral region: Secondary | ICD-10-CM | POA: Diagnosis not present

## 2015-06-02 DIAGNOSIS — M9903 Segmental and somatic dysfunction of lumbar region: Secondary | ICD-10-CM | POA: Diagnosis not present

## 2015-06-12 DIAGNOSIS — E785 Hyperlipidemia, unspecified: Secondary | ICD-10-CM | POA: Diagnosis not present

## 2015-06-12 DIAGNOSIS — Z794 Long term (current) use of insulin: Secondary | ICD-10-CM | POA: Diagnosis not present

## 2015-06-12 DIAGNOSIS — E039 Hypothyroidism, unspecified: Secondary | ICD-10-CM | POA: Diagnosis not present

## 2015-06-12 DIAGNOSIS — Z79899 Other long term (current) drug therapy: Secondary | ICD-10-CM | POA: Diagnosis not present

## 2015-06-12 DIAGNOSIS — E119 Type 2 diabetes mellitus without complications: Secondary | ICD-10-CM | POA: Diagnosis not present

## 2015-06-12 DIAGNOSIS — Z87891 Personal history of nicotine dependence: Secondary | ICD-10-CM | POA: Diagnosis not present

## 2015-06-12 DIAGNOSIS — M75122 Complete rotator cuff tear or rupture of left shoulder, not specified as traumatic: Secondary | ICD-10-CM | POA: Diagnosis not present

## 2015-06-12 DIAGNOSIS — I10 Essential (primary) hypertension: Secondary | ICD-10-CM | POA: Diagnosis not present

## 2015-06-17 ENCOUNTER — Other Ambulatory Visit: Payer: Self-pay | Admitting: Internal Medicine

## 2015-06-23 ENCOUNTER — Other Ambulatory Visit: Payer: Self-pay | Admitting: Internal Medicine

## 2015-06-24 DIAGNOSIS — M9903 Segmental and somatic dysfunction of lumbar region: Secondary | ICD-10-CM | POA: Diagnosis not present

## 2015-06-24 DIAGNOSIS — M9905 Segmental and somatic dysfunction of pelvic region: Secondary | ICD-10-CM | POA: Diagnosis not present

## 2015-06-24 DIAGNOSIS — M5136 Other intervertebral disc degeneration, lumbar region: Secondary | ICD-10-CM | POA: Diagnosis not present

## 2015-06-24 DIAGNOSIS — M9904 Segmental and somatic dysfunction of sacral region: Secondary | ICD-10-CM | POA: Diagnosis not present

## 2015-06-30 DIAGNOSIS — M5136 Other intervertebral disc degeneration, lumbar region: Secondary | ICD-10-CM | POA: Diagnosis not present

## 2015-06-30 DIAGNOSIS — M9903 Segmental and somatic dysfunction of lumbar region: Secondary | ICD-10-CM | POA: Diagnosis not present

## 2015-06-30 DIAGNOSIS — M9904 Segmental and somatic dysfunction of sacral region: Secondary | ICD-10-CM | POA: Diagnosis not present

## 2015-06-30 DIAGNOSIS — M9905 Segmental and somatic dysfunction of pelvic region: Secondary | ICD-10-CM | POA: Diagnosis not present

## 2015-07-04 DIAGNOSIS — E119 Type 2 diabetes mellitus without complications: Secondary | ICD-10-CM | POA: Diagnosis not present

## 2015-07-15 ENCOUNTER — Other Ambulatory Visit: Payer: Self-pay | Admitting: Internal Medicine

## 2015-07-16 ENCOUNTER — Encounter: Payer: Self-pay | Admitting: Internal Medicine

## 2015-07-16 ENCOUNTER — Ambulatory Visit (INDEPENDENT_AMBULATORY_CARE_PROVIDER_SITE_OTHER): Payer: Medicare Other | Admitting: Internal Medicine

## 2015-07-16 VITALS — BP 112/58 | HR 56 | Temp 98.0°F | Resp 16 | Ht 69.5 in | Wt 159.0 lb

## 2015-07-16 DIAGNOSIS — R350 Frequency of micturition: Secondary | ICD-10-CM | POA: Diagnosis not present

## 2015-07-16 DIAGNOSIS — Z79899 Other long term (current) drug therapy: Secondary | ICD-10-CM

## 2015-07-16 DIAGNOSIS — E119 Type 2 diabetes mellitus without complications: Secondary | ICD-10-CM

## 2015-07-16 LAB — CBC WITH DIFFERENTIAL/PLATELET
BASOS PCT: 1 %
Basophils Absolute: 66 cells/uL (ref 0–200)
EOS ABS: 132 {cells}/uL (ref 15–500)
Eosinophils Relative: 2 %
HEMATOCRIT: 43 % (ref 38.5–50.0)
HEMOGLOBIN: 14.6 g/dL (ref 13.2–17.1)
LYMPHS ABS: 1782 {cells}/uL (ref 850–3900)
Lymphocytes Relative: 27 %
MCH: 30.6 pg (ref 27.0–33.0)
MCHC: 34 g/dL (ref 32.0–36.0)
MCV: 90.1 fL (ref 80.0–100.0)
MONO ABS: 396 {cells}/uL (ref 200–950)
MPV: 8.8 fL (ref 7.5–12.5)
Monocytes Relative: 6 %
NEUTROS ABS: 4224 {cells}/uL (ref 1500–7800)
Neutrophils Relative %: 64 %
Platelets: 166 10*3/uL (ref 140–400)
RBC: 4.77 MIL/uL (ref 4.20–5.80)
RDW: 13.8 % (ref 11.0–15.0)
WBC: 6.6 10*3/uL (ref 3.8–10.8)

## 2015-07-16 LAB — BASIC METABOLIC PANEL WITH GFR
BUN: 22 mg/dL (ref 7–25)
CHLORIDE: 101 mmol/L (ref 98–110)
CO2: 24 mmol/L (ref 20–31)
Calcium: 9.3 mg/dL (ref 8.6–10.3)
Creat: 1.12 mg/dL (ref 0.70–1.18)
GFR, EST AFRICAN AMERICAN: 72 mL/min (ref >=60)
GFR, EST NON AFRICAN AMERICAN: 63 mL/min (ref >=60)
GLUCOSE: 156 mg/dL — AB (ref 65–99)
POTASSIUM: 4.4 mmol/L (ref 3.5–5.3)
Sodium: 136 mmol/L (ref 135–146)

## 2015-07-16 LAB — HEMOGLOBIN A1C
Hgb A1c MFr Bld: 7 % — ABNORMAL HIGH (ref <5.7)
MEAN PLASMA GLUCOSE: 154 mg/dL

## 2015-07-16 NOTE — Progress Notes (Signed)
   Subjective:    Patient ID: Gerald [name], male    DOB: 1934-12-06, 81 y.o.   MRN: [id number]  HPI  Patient presents to the office for evaluation of night time urinary frequency.  He reports that for the past 3-4 weeks he has been getting up 3-4 times a night.  Last night he got up six times.  He reports that he does not have any pain.  He reports that it is generally a large volume, it is clear, no odor to the urine.  No dysuria.  He does have some urgency to go to the bathroom. He does not have double voiding.  He has had some decline in the stream velocity but no recent changes.  He reports no dribbling.  He has no pain, urinary retention.     Review of Systems  Constitutional: Negative for fever, chills and fatigue.  Gastrointestinal: Negative for nausea, vomiting, abdominal pain, diarrhea and constipation.  Genitourinary: Positive for urgency and frequency. Negative for dysuria, hematuria and difficulty urinating.  Musculoskeletal: Positive for back pain.       Objective:   Physical Exam  Constitutional: He is oriented to person, place, and time. He appears well-developed and well-nourished. No distress.  HENT:  Head: Normocephalic.  Mouth/Throat: Oropharynx is clear and moist. No oropharyngeal exudate.  Eyes: Conjunctivae are normal. No scleral icterus.  Neck: Normal range of motion. Neck supple. No JVD present. No thyromegaly present.  Cardiovascular: Normal rate, regular rhythm, normal heart sounds and intact distal pulses.  Exam reveals no gallop and no friction rub.   No murmur heard. Pulmonary/Chest: Effort normal. No respiratory distress. He has no wheezes. He has no rales. He exhibits no tenderness.  Abdominal: Soft. Normal appearance and bowel sounds are normal. He exhibits no distension and no mass. There is no tenderness. There is no rigidity, no rebound, no guarding, no CVA tenderness, no tenderness at McBurney's point and negative Murphy's sign.  Musculoskeletal:  Normal range of motion.  Lymphadenopathy:    He has no cervical adenopathy.  Neurological: He is alert and oriented to person, place, and time.  Skin: Skin is warm and dry. He is not diaphoretic.  Psychiatric: He has a normal mood and affect. His behavior is normal. Judgment and thought content normal.  Nursing note and vitals reviewed.   Filed Vitals:   07/16/15 1117  BP: 112/58  Pulse: 56  Temp: 98 F (36.7 C)  Resp: 16   Wt Readings from Last 3 Encounters:  07/16/15 159 lb (72.122 kg)  05/12/15 160 lb 9.6 oz (72.848 kg)  04/23/15 163 lb (73.936 kg)          Assessment & Plan:   Patient with nighttime polyuria.  PE benign.  Will check A1C to see if this is related to worsening diabetes, will also check PSA level to determine whether there is a component of prostate cancer or increased BPH, will also check for infection cystits vs. Prostatits.  Less likely diabetes insipidus but will look at sodium levels.  Consider myrebetriq if all lab testing normal.    1. Urinary frequency  - PSA - Urinalysis, Routine w reflex microscopic ([facility]) - Urine culture  2. Type 2 diabetes mellitus without complication, without long-term current use of insulin (HCC)  - Hemoglobin A1c  3. Medication management  - CBC with Differential/Platelet - BASIC METABOLIC PANEL WITH GFR

## 2015-07-17 LAB — URINALYSIS, ROUTINE W REFLEX MICROSCOPIC
Bilirubin Urine: NEGATIVE
HGB URINE DIPSTICK: NEGATIVE
Ketones, ur: NEGATIVE
LEUKOCYTES UA: NEGATIVE
NITRITE: NEGATIVE
PH: 6 (ref 5.0–8.0)
Protein, ur: NEGATIVE
Specific Gravity, Urine: 1.018 (ref 1.001–1.035)

## 2015-07-17 LAB — PSA: PSA: 0.04 ng/mL (ref <=4.00)

## 2015-07-18 LAB — URINE CULTURE
Colony Count: NO GROWTH
ORGANISM ID, BACTERIA: NO GROWTH

## 2015-07-23 ENCOUNTER — Other Ambulatory Visit: Payer: Self-pay | Admitting: *Deleted

## 2015-07-23 MED ORDER — MIRABEGRON ER 25 MG PO TB24: 25.0000 mg | ORAL_TABLET | Freq: Every day | ORAL | Status: DC

## 2015-07-28 DIAGNOSIS — M9904 Segmental and somatic dysfunction of sacral region: Secondary | ICD-10-CM | POA: Diagnosis not present

## 2015-07-28 DIAGNOSIS — M5136 Other intervertebral disc degeneration, lumbar region: Secondary | ICD-10-CM | POA: Diagnosis not present

## 2015-07-28 DIAGNOSIS — M9905 Segmental and somatic dysfunction of pelvic region: Secondary | ICD-10-CM | POA: Diagnosis not present

## 2015-07-28 DIAGNOSIS — M9903 Segmental and somatic dysfunction of lumbar region: Secondary | ICD-10-CM | POA: Diagnosis not present

## 2015-08-07 ENCOUNTER — Other Ambulatory Visit: Payer: Self-pay | Admitting: Internal Medicine

## 2015-08-08 ENCOUNTER — Other Ambulatory Visit: Payer: Self-pay | Admitting: Internal Medicine

## 2015-08-08 ENCOUNTER — Other Ambulatory Visit: Payer: Self-pay | Admitting: *Deleted

## 2015-08-08 MED ORDER — ATENOLOL 100 MG PO TABS: ORAL_TABLET | ORAL | Status: DC

## 2015-08-11 ENCOUNTER — Other Ambulatory Visit: Payer: Self-pay | Admitting: *Deleted

## 2015-08-11 MED ORDER — ATENOLOL 50 MG PO TABS: ORAL_TABLET | ORAL | Status: DC

## 2015-08-13 DIAGNOSIS — M9903 Segmental and somatic dysfunction of lumbar region: Secondary | ICD-10-CM | POA: Diagnosis not present

## 2015-08-13 DIAGNOSIS — M5136 Other intervertebral disc degeneration, lumbar region: Secondary | ICD-10-CM | POA: Diagnosis not present

## 2015-08-13 DIAGNOSIS — M9905 Segmental and somatic dysfunction of pelvic region: Secondary | ICD-10-CM | POA: Diagnosis not present

## 2015-08-13 DIAGNOSIS — M9904 Segmental and somatic dysfunction of sacral region: Secondary | ICD-10-CM | POA: Diagnosis not present

## 2015-08-18 ENCOUNTER — Ambulatory Visit (INDEPENDENT_AMBULATORY_CARE_PROVIDER_SITE_OTHER): Payer: Medicare Other | Admitting: Physician Assistant

## 2015-08-18 ENCOUNTER — Encounter: Payer: Self-pay | Admitting: Physician Assistant

## 2015-08-18 VITALS — BP 126/82 | HR 54 | Temp 98.2°F | Resp 16 | Ht 69.5 in

## 2015-08-18 DIAGNOSIS — E1122 Type 2 diabetes mellitus with diabetic chronic kidney disease: Secondary | ICD-10-CM

## 2015-08-18 DIAGNOSIS — N182 Chronic kidney disease, stage 2 (mild): Secondary | ICD-10-CM

## 2015-08-18 DIAGNOSIS — E039 Hypothyroidism, unspecified: Secondary | ICD-10-CM

## 2015-08-18 DIAGNOSIS — I10 Essential (primary) hypertension: Secondary | ICD-10-CM | POA: Diagnosis not present

## 2015-08-18 DIAGNOSIS — Z794 Long term (current) use of insulin: Secondary | ICD-10-CM | POA: Diagnosis not present

## 2015-08-18 DIAGNOSIS — Z79899 Other long term (current) drug therapy: Secondary | ICD-10-CM

## 2015-08-18 DIAGNOSIS — E782 Mixed hyperlipidemia: Secondary | ICD-10-CM | POA: Diagnosis not present

## 2015-08-18 DIAGNOSIS — E559 Vitamin D deficiency, unspecified: Secondary | ICD-10-CM | POA: Diagnosis not present

## 2015-08-18 DIAGNOSIS — E119 Type 2 diabetes mellitus without complications: Secondary | ICD-10-CM

## 2015-08-18 DIAGNOSIS — I700 Atherosclerosis of aorta: Secondary | ICD-10-CM | POA: Insufficient documentation

## 2015-08-18 LAB — CBC WITH DIFFERENTIAL/PLATELET
BASOS PCT: 1 %
Basophils Absolute: 62 cells/uL (ref 0–200)
EOS PCT: 3 %
Eosinophils Absolute: 186 cells/uL (ref 15–500)
HEMATOCRIT: 44.3 % (ref 38.5–50.0)
Hemoglobin: 14.9 g/dL (ref 13.2–17.1)
LYMPHS ABS: 1426 {cells}/uL (ref 850–3900)
Lymphocytes Relative: 23 %
MCH: 30.5 pg (ref 27.0–33.0)
MCHC: 33.6 g/dL (ref 32.0–36.0)
MCV: 90.8 fL (ref 80.0–100.0)
MPV: 9.1 fL (ref 7.5–12.5)
Monocytes Absolute: 434 cells/uL (ref 200–950)
Monocytes Relative: 7 %
NEUTROS ABS: 4092 {cells}/uL (ref 1500–7800)
Neutrophils Relative %: 66 %
Platelets: 159 10*3/uL (ref 140–400)
RBC: 4.88 MIL/uL (ref 4.20–5.80)
RDW: 13.6 % (ref 11.0–15.0)
WBC: 6.2 10*3/uL (ref 3.8–10.8)

## 2015-08-18 LAB — BASIC METABOLIC PANEL WITH GFR
BUN: 24 mg/dL (ref 7–25)
CO2: 26 mmol/L (ref 20–31)
Calcium: 9.5 mg/dL (ref 8.6–10.3)
Chloride: 106 mmol/L (ref 98–110)
Creat: 1 mg/dL (ref 0.70–1.18)
GFR, EST AFRICAN AMERICAN: 83 mL/min (ref >=60)
GFR, EST NON AFRICAN AMERICAN: 72 mL/min (ref >=60)
GLUCOSE: 90 mg/dL (ref 65–99)
POTASSIUM: 4.5 mmol/L (ref 3.5–5.3)
Sodium: 141 mmol/L (ref 135–146)

## 2015-08-18 LAB — TSH: TSH: 0.69 mIU/L (ref 0.40–4.50)

## 2015-08-18 LAB — HEPATIC FUNCTION PANEL
ALK PHOS: 44 U/L (ref 40–115)
ALT: 24 U/L (ref 9–46)
AST: 26 U/L (ref 10–35)
Albumin: 4.3 g/dL (ref 3.6–5.1)
BILIRUBIN INDIRECT: 0.5 mg/dL (ref 0.2–1.2)
BILIRUBIN TOTAL: 0.6 mg/dL (ref 0.2–1.2)
Bilirubin, Direct: 0.1 mg/dL (ref <=0.2)
TOTAL PROTEIN: 6.3 g/dL (ref 6.1–8.1)

## 2015-08-18 LAB — LIPID PANEL
CHOL/HDL RATIO: 2.5 ratio (ref <=5.0)
Cholesterol: 119 mg/dL — ABNORMAL LOW (ref 125–200)
HDL: 47 mg/dL (ref >=40)
LDL CALC: 48 mg/dL (ref <130)
TRIGLYCERIDES: 119 mg/dL (ref <150)
VLDL: 24 mg/dL (ref <30)

## 2015-08-18 LAB — MAGNESIUM: Magnesium: 1.8 mg/dL (ref 1.5–2.5)

## 2015-08-18 MED ORDER — MIRABEGRON ER 25 MG PO TB24: 25.0000 mg | ORAL_TABLET | Freq: Every day | ORAL | Status: DC

## 2015-08-18 NOTE — Patient Instructions (Signed)
    Bad carbs also include fruit juice, alcohol, and sweet tea. These are empty calories that do not signal to your brain that you are full.   Please remember the good carbs are still carbs which convert into sugar. So please measure them out no more than 1/2-1 cup of rice, oatmeal, pasta, and beans  Veggies are however free foods! Pile them on.   Not all fruit is created equal. Please see the list below, the fruit at the bottom is higher in sugars than the fruit at the top. Please avoid all dried fruits.      Your A1C is a measure of your sugar over the past 3 months and is not affected by what you have eaten over the past few days. Diabetes increases your chances of stroke and heart attack over 300 % and is the leading cause of blindness and kidney failure in the [place]. Please make sure you decrease bad carbs like white bread, white rice, potatoes, corn, soft drinks, pasta, cereals, refined sugars, sweet tea, dried fruits, and fruit juice. Good carbs are okay to eat in moderation like sweet potatoes, brown rice, whole grain pasta/bread, most fruit (except dried fruit) and you can eat as many veggies as you want.   Greater than 6.5 is considered diabetic. Between 6.4 and 5.7 is prediabetic If your A1C is less than 5.7 you are NOT diabetic.  Targets for Glucose Readings: Time of Check Target for patients WITHOUT Diabetes Target for DIABETICS  Before Meals Less than 100  less than 150  Two hours after meals Less than 200  Less than 250   A low blood sugar is much more dangerous than a high blood sugar. Your brain needs two things, sugar and oxygen.

## 2015-08-18 NOTE — Progress Notes (Signed)
Assessment and Plan:   Hypertension -Continue medication, monitor blood pressure at home. Continue DASH diet.  Reminder to go to the ER if any CP, SOB, nausea, dizziness, severe HA, changes vision/speech, left arm numbness and tingling and jaw pain.  Cholesterol -Continue diet and exercise. Check cholesterol.    Diabetes with diabetic chronic kidney disease -Continue diet and exercise. Check A1C next OV, checked in May - counseled about hypoglycemia, suggest decreasing insulin by 2-3 units  Vitamin D Def - check level and continue medications.   Thoracic aorta Atherosclerosis Control blood pressure, cholesterol, glucose, increase exercise.    Continue diet and meds as discussed. Further disposition pending results of labs. Discussed med's effects and SE's.   Over 30 minutes of exam, counseling, chart review, and critical decision making was performed Future Appointments Date Time Provider Department Center  11/26/2015 9:00 AM Lucky CowboyWilliam McKeown, MD [facility] None     HPI 81 y.o. male  presents for 3 month follow up on hypertension, cholesterol, diabetes and vitamin D deficiency.   His blood pressure has been controlled at home, today their BP is BP: 126/82 mmHg  He does workout. He denies chest pain, shortness of breath, dizziness.  He is on cholesterol medication and denies myalgias. His cholesterol is at goal. The cholesterol last visit was:   Lab Results  Component Value Date   CHOL 136 05/12/2015   HDL 43 05/12/2015   LDLCALC 62 05/12/2015   TRIG 157* 05/12/2015   CHOLHDL 3.2 05/12/2015   He has been working on diet and exercise for Diabetes with diabetic chronic kidney disease, he is on bASA, he is on ACE/ARB, he is insulin dependent on 35 units and 15units lantus, and denies paresthesia of the feet, polydipsia and visual disturbances. Has had good glucose readings at home, will occ run in the 70's. Patient was complaining of frequent urination in May, started on Myrbetriq  after normal labs, medication has helped significantly.  Last A1C was:  Lab Results  Component Value Date   HGBA1C 7.0* 07/16/2015   Patient is on Vitamin D supplement.   Lab Results  Component Value Date   VD25OH 6870 05/12/2015     He is on thyroid medication. His medication was not changed last visit.   Lab Results  Component Value Date   TSH 3.22 05/12/2015  .   Current Medications:  Current Outpatient Prescriptions on File Prior to Visit  Medication Sig Dispense Refill  . aspirin 81 MG tablet Take by mouth daily.     Marland Kitchen. atenolol (TENORMIN) 50 MG tablet Take 2 tablets daily for BP. 180 tablet 1  . baclofen (LIORESAL) 10 MG tablet TAKE 1/2-1 TABLET BY MOUTH 2-3 TIMES A DAY AS NEEDED FOR MUSCLE SPASMS 60 tablet 3  . celecoxib (CELEBREX) 200 MG capsule TAKE 1 CAPSULE BY MOUTH DAILY FOR PAIN AND INFLAMMATION 90 capsule 1  . Cholecalciferol (VITAMIN D PO) Take 5,000 Units by mouth daily.    Marland Kitchen. ezetimibe (ZETIA) 10 MG tablet TAKE 1 TABLET BY MOUTH DAILY 30 tablet 0  . finasteride (PROSCAR) 5 MG tablet TAKE 1 TABLET BY MOUTH DAILY 90 tablet 1  . fish oil-omega-3 fatty acids 1000 MG capsule Take 1 g by mouth daily.     . Insulin Pen Needle (BD PEN NEEDLE NANO U/F) 32G X 4 MM MISC Test sugars 2 times daily  Dx: E11.9 100 each 12  . LANTUS SOLOSTAR 100 UNIT/ML Solostar Pen USE AS DIRECTED 45 mL 1  . levothyroxine (  SYNTHROID, LEVOTHROID) 150 MCG tablet TAKE 1 TABLET BY MOUTH EVERY MORNING 90 tablet 1  . losartan-hydrochlorothiazide (HYZAAR) 100-25 MG tablet TAKE 1 TABLET BY MOUTH EVERY DAY FOR HIGH BLOOD PRESSURE 90 tablet 1  . metFORMIN (GLUCOPHAGE-XR) 500 MG 24 hr tablet TAKE 4 TABLETS BY MOUTH EVERY DAY FOR DIABETES 360 tablet 0  . minocycline (MINOCIN,DYNACIN) 100 MG capsule TAKE 1 CAPSULE BY MOUTH DAILY 90 capsule 1  . Multiple Vitamin (MULTIVITAMIN) tablet Take 1 tablet by mouth daily.    . pantoprazole (PROTONIX) 40 MG tablet TAKE 1 TABLET(40 MG) BY MOUTH DAILY 90 tablet 0  .  simvastatin (ZOCOR) 40 MG tablet TAKE 1 TABLET BY MOUTH EVERY NIGHT AT BEDTIME FOR CHOLESTEROL 90 tablet PRN  . traMADol (ULTRAM) 50 MG tablet Take 1 tablet (50 mg total) by mouth every 6 (six) hours as needed for moderate pain. 90 tablet 0   No current facility-administered medications on file prior to visit.   Medical History:  Past Medical History  Diagnosis Date  . GERD (gastroesophageal reflux disease)   . BPH (benign prostatic hypertrophy)   . Rotator cuff tear, right RECURRENT  . Vitamin D deficiency    Allergies:  Allergies  Allergen Reactions  . Ace Inhibitors      Review of Systems:  Review of Systems  Constitutional: Negative.   HENT: Negative.   Eyes: Negative.   Respiratory: Negative.   Cardiovascular: Negative.   Gastrointestinal: Negative.   Genitourinary: Negative.   Musculoskeletal: Positive for back pain. Negative for myalgias, joint pain, falls and neck pain.  Skin: Negative.   Neurological: Negative.   Endo/Heme/Allergies: Negative.   Psychiatric/Behavioral: Positive for memory loss. Negative for depression, suicidal ideas, hallucinations and substance abuse. The patient is not nervous/anxious and does not have insomnia.     Family history- Review and unchanged Social history- Review and unchanged Physical Exam: BP 126/82 mmHg  Pulse 54  Temp(Src) 98.2 F (36.8 C) (Temporal)  Resp 16  Ht 5' 9.5" (1.765 m) Wt Readings from Last 3 Encounters:  07/16/15 159 lb (72.122 kg)  05/12/15 160 lb 9.6 oz (72.848 kg)  04/23/15 163 lb (73.936 kg)   General Appearance: Well nourished, in no apparent distress. Eyes: PERRLA, EOMs, conjunctiva no swelling or erythema Sinuses: No Frontal/maxillary tenderness ENT/Mouth: Ext aud canals clear, TMs without erythema, bulging. No erythema, swelling, or exudate on post pharynx.  Tonsils not swollen or erythematous. Hearing normal.  Neck: Supple, thyroid normal.  Respiratory: Respiratory effort normal, BS equal  bilaterally without rales, rhonchi, wheezing or stridor.  Cardio: RRR with no MRGs. Brisk peripheral pulses without edema.  Abdomen: Soft, + BS.  Non tender, no guarding, rebound, hernias, masses. Lymphatics: Non tender without lymphadenopathy.  Musculoskeletal: Full ROM, 5/5 strength, Normal gait Skin: Warm, dry without rashes, lesions, ecchymosis.  Neuro: Cranial nerves intact. No cerebellar symptoms.  Psych: Awake and oriented X 3, normal affect, Insight and Judgment appropriate.    Quentin MullingAmanda Kylah Maresh, PA-C 8:49 AM [facility]

## 2015-08-22 ENCOUNTER — Other Ambulatory Visit: Payer: Self-pay | Admitting: Internal Medicine

## 2015-08-31 ENCOUNTER — Encounter: Payer: Self-pay | Admitting: *Deleted

## 2015-09-08 ENCOUNTER — Other Ambulatory Visit: Payer: Self-pay | Admitting: Internal Medicine

## 2015-09-10 ENCOUNTER — Other Ambulatory Visit: Payer: Self-pay | Admitting: Internal Medicine

## 2015-09-10 DIAGNOSIS — N3281 Overactive bladder: Secondary | ICD-10-CM

## 2015-09-10 DIAGNOSIS — M9904 Segmental and somatic dysfunction of sacral region: Secondary | ICD-10-CM | POA: Diagnosis not present

## 2015-09-10 DIAGNOSIS — M5136 Other intervertebral disc degeneration, lumbar region: Secondary | ICD-10-CM | POA: Diagnosis not present

## 2015-09-10 DIAGNOSIS — M9905 Segmental and somatic dysfunction of pelvic region: Secondary | ICD-10-CM | POA: Diagnosis not present

## 2015-09-10 DIAGNOSIS — M9903 Segmental and somatic dysfunction of lumbar region: Secondary | ICD-10-CM | POA: Diagnosis not present

## 2015-09-10 MED ORDER — MIRABEGRON ER 50 MG PO TB24: ORAL_TABLET | ORAL | Status: DC

## 2015-09-12 DIAGNOSIS — L603 Nail dystrophy: Secondary | ICD-10-CM | POA: Diagnosis not present

## 2015-09-12 DIAGNOSIS — L609 Nail disorder, unspecified: Secondary | ICD-10-CM | POA: Diagnosis not present

## 2015-09-12 DIAGNOSIS — L600 Ingrowing nail: Secondary | ICD-10-CM | POA: Diagnosis not present

## 2015-09-16 ENCOUNTER — Other Ambulatory Visit: Payer: Self-pay | Admitting: Internal Medicine

## 2015-10-10 DIAGNOSIS — Z6824 Body mass index (BMI) 24.0-24.9, adult: Secondary | ICD-10-CM | POA: Diagnosis not present

## 2015-10-10 DIAGNOSIS — L089 Local infection of the skin and subcutaneous tissue, unspecified: Secondary | ICD-10-CM | POA: Diagnosis not present

## 2015-10-10 DIAGNOSIS — S90862A Insect bite (nonvenomous), left foot, initial encounter: Secondary | ICD-10-CM | POA: Diagnosis not present

## 2015-10-10 DIAGNOSIS — W57XXXA Bitten or stung by nonvenomous insect and other nonvenomous arthropods, initial encounter: Secondary | ICD-10-CM | POA: Diagnosis not present

## 2015-10-11 DIAGNOSIS — L03116 Cellulitis of left lower limb: Secondary | ICD-10-CM | POA: Diagnosis not present

## 2015-10-13 ENCOUNTER — Other Ambulatory Visit: Payer: Self-pay | Admitting: Internal Medicine

## 2015-10-16 ENCOUNTER — Ambulatory Visit (INDEPENDENT_AMBULATORY_CARE_PROVIDER_SITE_OTHER): Payer: Medicare Other | Admitting: Internal Medicine

## 2015-10-16 ENCOUNTER — Encounter: Payer: Self-pay | Admitting: Internal Medicine

## 2015-10-16 VITALS — BP 144/62 | HR 58 | Temp 98.2°F | Resp 16 | Ht 69.5 in | Wt 156.0 lb

## 2015-10-16 DIAGNOSIS — E039 Hypothyroidism, unspecified: Secondary | ICD-10-CM

## 2015-10-16 DIAGNOSIS — Z23 Encounter for immunization: Secondary | ICD-10-CM

## 2015-10-16 DIAGNOSIS — I10 Essential (primary) hypertension: Secondary | ICD-10-CM | POA: Diagnosis not present

## 2015-10-16 DIAGNOSIS — S39012A Strain of muscle, fascia and tendon of lower back, initial encounter: Secondary | ICD-10-CM

## 2015-10-16 MED ORDER — TRAMADOL HCL 50 MG PO TABS: 50.0000 mg | ORAL_TABLET | Freq: Four times a day (QID) | ORAL | 0 refills | Status: AC | PRN

## 2015-10-16 MED ORDER — CEPHALEXIN 500 MG PO CAPS: 500.0000 mg | ORAL_CAPSULE | Freq: Three times a day (TID) | ORAL | 0 refills | Status: AC

## 2015-10-16 NOTE — Progress Notes (Signed)
Subjective:    Patient ID: Gerald [name], male    DOB: [birth year], 81 y.o.   MRN: [id number]  HPI  Patient presents to the office for evaluation of right sided back pain x 5-6 days.  He reports that it is a soreness over the right side of the lower back.  He reports that it does not wake him up at nighttime.  No radiation.  No injury that he can think of.  He has been doing some work at home. He reports that he has been taking care of her.  He reports that he has been helping to move her.  He denies loss of bowel or bladder, saddle anesthesia, weakness, or abdominal pain.    He was recently seen at an outside UC for spider bite slash cellulitis of the left foot.  He is taking bactrim for it.  It is somewhat better, but is still draining.  No fevers,chills nausea vomiting.     Review of Systems  Constitutional: Negative for chills, fatigue and fever.  Respiratory: Negative for chest tightness and shortness of breath.   Cardiovascular: Negative for chest pain and palpitations.  Gastrointestinal: Negative for abdominal pain.  Genitourinary: Negative for dysuria, frequency, hematuria and urgency.  Musculoskeletal: Positive for gait problem.  Skin: Positive for wound.       Objective:   Physical Exam  Constitutional: He is oriented to person, place, and time. He appears well-developed and well-nourished. No distress.  HENT:  Head: Normocephalic.  Mouth/Throat: Oropharynx is clear and moist. No oropharyngeal exudate.  Eyes: Conjunctivae are normal. No scleral icterus.  Neck: Normal range of motion. Neck supple. No JVD present. No thyromegaly present.  Cardiovascular: Normal rate, regular rhythm, normal heart sounds and intact distal pulses.  Exam reveals no gallop and no friction rub.   No murmur heard. Pulmonary/Chest: Effort normal and breath sounds normal. No respiratory distress. He has no wheezes. He has no rales. He exhibits no tenderness.  Abdominal: Soft. Bowel sounds are  normal. He exhibits no distension and no mass. There is no tenderness. There is no rebound and no guarding.  Musculoskeletal: Normal range of motion.  Patient rises slowly from sitting to standing.  They walk without an antalgic gait.  There is no evidence of erythema, ecchymosis, or gross deformity.  There is tenderness to palpation over right SI joint and mild tenderness to palpation of the right paraspinal lumbar muscles.  Active ROM is full but mildly painful with rotation and side bending.  Sensation to light touch is intact over all extremities.  Strength is symmetric and equal in all extremities.  Lymphadenopathy:    He has no cervical adenopathy.  Neurological: He is alert and oriented to person, place, and time. No cranial nerve deficit. Coordination normal.  Skin: Skin is warm and dry. He is not diaphoretic.  Erythematous scabbed wound to the plantar surface of the foot without discharge.  One solitary firm nodule beside scabbed lesion.  Non-tender to palpation.    Psychiatric: He has a normal mood and affect. His behavior is normal. Judgment and thought content normal.  Nursing note and vitals reviewed.   Vitals:   10/16/15 1028  BP: (!) 144/62  Pulse: (!) 58  Resp: 16  Temp: 98.2 F (36.8 C)         Assessment & Plan:    1.  Right sided lumbar paraspinal strain -cont baclofen -heat -gentle stretching -avoid bending and twisting motions -likely from moving his wife who  recently had surgery - traMADol (ULTRAM) 50 MG tablet; Take 1 tablet (50 mg total) by mouth every 6 (six) hours as needed for moderate pain.  Dispense: 90 tablet; Refill: 0  2. Need for prophylactic vaccination and inoculation against influenza  - Flu vaccine HIGH DOSE PF (Fluzone High dose)

## 2015-10-16 NOTE — Patient Instructions (Signed)
Please keep taking the baclofen 2-3 times per day to help relieve muscle spasms.  Please take tramadol/ultram as needed for pains.  Use heat on the area that is giving you pain.    Avoid heavy lifting and bending over with twisting movements.    If you still have no relief in a week please call me and let me know.    Low Back Strain With Rehab A strain is an injury in which a tendon or muscle is torn. The muscles and tendons of the lower back are vulnerable to strains. However, these muscles and tendons are very strong and require a great force to be injured. Strains are classified into three categories. Grade 1 strains cause pain, but the tendon is not lengthened. Grade 2 strains include a lengthened ligament, due to the ligament being stretched or partially ruptured. With grade 2 strains there is still function, although the function may be decreased. Grade 3 strains involve a complete tear of the tendon or muscle, and function is usually impaired. SYMPTOMS   Pain in the lower back.  Pain that affects one side more than the other.  Pain that gets worse with movement and may be felt in the hip, buttocks, or back of the thigh.  Muscle spasms of the muscles in the back.  Swelling along the muscles of the back.  Loss of strength of the back muscles.  Crackling sound (crepitation) when the muscles are touched. CAUSES  Lower back strains occur when a force is placed on the muscles or tendons that is greater than they can handle. Common causes of injury include:  Prolonged overuse of the muscle-tendon units in the lower back, usually from incorrect posture.  A single violent injury or force applied to the back. RISK INCREASES WITH:  Sports that involve twisting forces on the spine or a lot of bending at the waist (football, rugby, weightlifting, bowling, golf, tennis, speed skating, racquetball, swimming, running, gymnastics, diving).  Poor strength and flexibility.  Failure to  warm up properly before activity.  Family history of lower back pain or disk disorders.  Previous back injury or surgery (especially fusion).  Poor posture with lifting, especially heavy objects.  Prolonged sitting, especially with poor posture. PREVENTION   Learn and use proper posture when sitting or lifting (maintain proper posture when sitting, lift using the knees and legs, not at the waist).  Warm up and stretch properly before activity.  Allow for adequate recovery between workouts.  Maintain physical fitness:  Strength, flexibility, and endurance.  Cardiovascular fitness. PROGNOSIS  If treated properly, lower back strains usually heal within 6 weeks. RELATED COMPLICATIONS   Recurring symptoms, resulting in a chronic problem.  Chronic inflammation, scarring, and partial muscle-tendon tear.  Delayed healing or resolution of symptoms.  Prolonged disability. TREATMENT  Treatment first involves the use of ice and medicine, to reduce pain and inflammation. The use of strengthening and stretching exercises may help reduce pain with activity. These exercises may be performed at home or with a therapist. Severe injuries may require referral to a therapist for further evaluation and treatment, such as ultrasound. Your caregiver may advise that you wear a back brace or corset, to help reduce pain and discomfort. Often, prolonged bed rest results in greater harm then benefit. Corticosteroid injections may be recommended. However, these should be reserved for the most serious cases. It is important to avoid using your back when lifting objects. At night, sleep on your back on a firm mattress with  a pillow placed under your knees. If non-surgical treatment is unsuccessful, surgery may be needed.  MEDICATION   If pain medicine is needed, nonsteroidal anti-inflammatory medicines (aspirin and ibuprofen), or other minor pain relievers (acetaminophen), are often advised.  Do not take pain  medicine for 7 days before surgery.  Prescription pain relievers may be given, if your caregiver thinks they are needed. Use only as directed and only as much as you need.  Ointments applied to the skin may be helpful.  Corticosteroid injections may be given by your caregiver. These injections should be reserved for the most serious cases, because they may only be given a certain number of times. HEAT AND COLD  Cold treatment (icing) should be applied for 10 to 15 minutes every 2 to 3 hours for inflammation and pain, and immediately after activity that aggravates your symptoms. Use ice packs or an ice massage.  Heat treatment may be used before performing stretching and strengthening activities prescribed by your caregiver, physical therapist, or athletic trainer. Use a heat pack or a warm water soak. SEEK MEDICAL CARE IF:   Symptoms get worse or do not improve in 2 to 4 weeks, despite treatment.  You develop numbness, weakness, or loss of bowel or bladder function.  New, unexplained symptoms develop. (Drugs used in treatment may produce side effects.) EXERCISES  RANGE OF MOTION (ROM) AND STRETCHING EXERCISES - Low Back Strain Most people with lower back pain will find that their symptoms get worse with excessive bending forward (flexion) or arching at the lower back (extension). The exercises which will help resolve your symptoms will focus on the opposite motion.  Your physician, physical therapist or athletic trainer will help you determine which exercises will be most helpful to resolve your lower back pain. Do not complete any exercises without first consulting with your caregiver. Discontinue any exercises which make your symptoms worse until you speak to your caregiver.  If you have pain, numbness or tingling which travels down into your buttocks, leg or foot, the goal of the therapy is for these symptoms to move closer to your back and eventually resolve. Sometimes, these leg symptoms  will get better, but your lower back pain may worsen. This is typically an indication of progress in your rehabilitation. Be very alert to any changes in your symptoms and the activities in which you participated in the 24 hours prior to the change. Sharing this information with your caregiver will allow him/her to most efficiently treat your condition.  These exercises may help you when beginning to rehabilitate your injury. Your symptoms may resolve with or without further involvement from your physician, physical therapist or athletic trainer. While completing these exercises, remember:  Restoring tissue flexibility helps normal motion to return to the joints. This allows healthier, less painful movement and activity.  An effective stretch should be held for at least 30 seconds.  A stretch should never be painful. You should only feel a gentle lengthening or release in the stretched tissue. FLEXION RANGE OF MOTION AND STRETCHING EXERCISES: STRETCH - Flexion, Single Knee to Chest   Lie on a firm bed or floor with both legs extended in front of you.  Keeping one leg in contact with the floor, bring your opposite knee to your chest. Hold your leg in place by either grabbing behind your thigh or at your knee.  Pull until you feel a gentle stretch in your lower back. Hold __________ seconds.  Slowly release your grasp and repeat the  exercise with the opposite side. Repeat __________ times. Complete this exercise __________ times per day.  STRETCH - Flexion, Double Knee to Chest   Lie on a firm bed or floor with both legs extended in front of you.  Keeping one leg in contact with the floor, bring your opposite knee to your chest.  Tense your stomach muscles to support your back and then lift your other knee to your chest. Hold your legs in place by either grabbing behind your thighs or at your knees.  Pull both knees toward your chest until you feel a gentle stretch in your lower back. Hold  __________ seconds.  Tense your stomach muscles and slowly return one leg at a time to the floor. Repeat __________ times. Complete this exercise __________ times per day.  STRETCH - Low Trunk Rotation  Lie on a firm bed or floor. Keeping your legs in front of you, bend your knees so they are both pointed toward the ceiling and your feet are flat on the floor.  Extend your arms out to the side. This will stabilize your upper body by keeping your shoulders in contact with the floor.  Gently and slowly drop both knees together to one side until you feel a gentle stretch in your lower back. Hold for __________ seconds.  Tense your stomach muscles to support your lower back as you bring your knees back to the starting position. Repeat the exercise to the other side. Repeat __________ times. Complete this exercise __________ times per day  EXTENSION RANGE OF MOTION AND FLEXIBILITY EXERCISES: STRETCH - Extension, Prone on Elbows   Lie on your stomach on the floor, a bed will be too soft. Place your palms about shoulder width apart and at the height of your head.  Place your elbows under your shoulders. If this is too painful, stack pillows under your chest.  Allow your body to relax so that your hips drop lower and make contact more completely with the floor.  Hold this position for __________ seconds.  Slowly return to lying flat on the floor. Repeat __________ times. Complete this exercise __________ times per day.  RANGE OF MOTION - Extension, Prone Press Ups  Lie on your stomach on the floor, a bed will be too soft. Place your palms about shoulder width apart and at the height of your head.  Keeping your back as relaxed as possible, slowly straighten your elbows while keeping your hips on the floor. You may adjust the placement of your hands to maximize your comfort. As you gain motion, your hands will come more underneath your shoulders.  Hold this position __________  seconds.  Slowly return to lying flat on the floor. Repeat __________ times. Complete this exercise __________ times per day.  RANGE OF MOTION- Quadruped, Neutral Spine   Assume a hands and knees position on a firm surface. Keep your hands under your shoulders and your knees under your hips. You may place padding under your knees for comfort.  Drop your head and point your tail bone toward the ground below you. This will round out your lower back like an angry cat. Hold this position for __________ seconds.  Slowly lift your head and release your tail bone so that your back sags into a large arch, like an old horse.  Hold this position for __________ seconds.  Repeat this until you feel limber in your lower back.  Now, find your "sweet spot." This will be the most comfortable position somewhere between  the two previous positions. This is your neutral spine. Once you have found this position, tense your stomach muscles to support your lower back.  Hold this position for __________ seconds. Repeat __________ times. Complete this exercise __________ times per day.  STRENGTHENING EXERCISES - Low Back Strain These exercises may help you when beginning to rehabilitate your injury. These exercises should be done near your "sweet spot." This is the neutral, low-back arch, somewhere between fully rounded and fully arched, that is your least painful position. When performed in this safe range of motion, these exercises can be used for people who have either a flexion or extension based injury. These exercises may resolve your symptoms with or without further involvement from your physician, physical therapist or athletic trainer. While completing these exercises, remember:   Muscles can gain both the endurance and the strength needed for everyday activities through controlled exercises.  Complete these exercises as instructed by your physician, physical therapist or athletic trainer. Increase the  resistance and repetitions only as guided.  You may experience muscle soreness or fatigue, but the pain or discomfort you are trying to eliminate should never worsen during these exercises. If this pain does worsen, stop and make certain you are following the directions exactly. If the pain is still present after adjustments, discontinue the exercise until you can discuss the trouble with your caregiver. STRENGTHENING - Deep Abdominals, Pelvic Tilt  Lie on a firm bed or floor. Keeping your legs in front of you, bend your knees so they are both pointed toward the ceiling and your feet are flat on the floor.  Tense your lower abdominal muscles to press your lower back into the floor. This motion will rotate your pelvis so that your tail bone is scooping upwards rather than pointing at your feet or into the floor.  With a gentle tension and even breathing, hold this position for __________ seconds. Repeat __________ times. Complete this exercise __________ times per day.  STRENGTHENING - Abdominals, Crunches   Lie on a firm bed or floor. Keeping your legs in front of you, bend your knees so they are both pointed toward the ceiling and your feet are flat on the floor. Cross your arms over your chest.  Slightly tip your chin down without bending your neck.  Tense your abdominals and slowly lift your trunk high enough to just clear your shoulder blades. Lifting higher can put excessive stress on the lower back and does not further strengthen your abdominal muscles.  Control your return to the starting position. Repeat __________ times. Complete this exercise __________ times per day.  STRENGTHENING - Quadruped, Opposite UE/LE Lift   Assume a hands and knees position on a firm surface. Keep your hands under your shoulders and your knees under your hips. You may place padding under your knees for comfort.  Find your neutral spine and gently tense your abdominal muscles so that you can maintain this  position. Your shoulders and hips should form a rectangle that is parallel with the floor and is not twisted.  Keeping your trunk steady, lift your right hand no higher than your shoulder and then your left leg no higher than your hip. Make sure you are not holding your breath. Hold this position __________ seconds.  Continuing to keep your abdominal muscles tense and your back steady, slowly return to your starting position. Repeat with the opposite arm and leg. Repeat __________ times. Complete this exercise __________ times per day.  STRENGTHENING - Lower Abdominals,  Double Knee Lift  Lie on a firm bed or floor. Keeping your legs in front of you, bend your knees so they are both pointed toward the ceiling and your feet are flat on the floor.  Tense your abdominal muscles to brace your lower back and slowly lift both of your knees until they come over your hips. Be certain not to hold your breath.  Hold __________ seconds. Using your abdominal muscles, return to the starting position in a slow and controlled manner. Repeat __________ times. Complete this exercise __________ times per day.  POSTURE AND BODY MECHANICS CONSIDERATIONS - Low Back Strain Keeping correct posture when sitting, standing or completing your activities will reduce the stress put on different body tissues, allowing injured tissues a chance to heal and limiting painful experiences. The following are general guidelines for improved posture. Your physician or physical therapist will provide you with any instructions specific to your needs. While reading these guidelines, remember:  The exercises prescribed by your provider will help you have the flexibility and strength to maintain correct postures.  The correct posture provides the best environment for your joints to work. All of your joints have less wear and tear when properly supported by a spine with good posture. This means you will experience a healthier, less painful  body.  Correct posture must be practiced with all of your activities, especially prolonged sitting and standing. Correct posture is as important when doing repetitive low-stress activities (typing) as it is when doing a single heavy-load activity (lifting). RESTING POSITIONS Consider which positions are most painful for you when choosing a resting position. If you have pain with flexion-based activities (sitting, bending, stooping, squatting), choose a position that allows you to rest in a less flexed posture. You would want to avoid curling into a fetal position on your side. If your pain worsens with extension-based activities (prolonged standing, working overhead), avoid resting in an extended position such as sleeping on your stomach. Most people will find more comfort when they rest with their spine in a more neutral position, neither too rounded nor too arched. Lying on a non-sagging bed on your side with a pillow between your knees, or on your back with a pillow under your knees will often provide some relief. Keep in mind, being in any one position for a prolonged period of time, no matter how correct your posture, can still lead to stiffness. PROPER SITTING POSTURE In order to minimize stress and discomfort on your spine, you must sit with correct posture. Sitting with good posture should be effortless for a healthy body. Returning to good posture is a gradual process. Many people can work toward this most comfortably by using various supports until they have the flexibility and strength to maintain this posture on their own. When sitting with proper posture, your ears will fall over your shoulders and your shoulders will fall over your hips. You should use the back of the chair to support your upper back. Your lower back will be in a neutral position, just slightly arched. You may place a small pillow or folded towel at the base of your lower back for support.  When working at a desk, create an  environment that supports good, upright posture. Without extra support, muscles tire, which leads to excessive strain on joints and other tissues. Keep these recommendations in mind: CHAIR:  A chair should be able to slide under your desk when your back makes contact with the back of the chair. This  allows you to work closely.  The chair's height should allow your eyes to be level with the upper part of your monitor and your hands to be slightly lower than your elbows. BODY POSITION  Your feet should make contact with the floor. If this is not possible, use a foot rest.  Keep your ears over your shoulders. This will reduce stress on your neck and lower back. INCORRECT SITTING POSTURES  If you are feeling tired and unable to assume a healthy sitting posture, do not slouch or slump. This puts excessive strain on your back tissues, causing more damage and pain. Healthier options include:  Using more support, like a lumbar pillow.  Switching tasks to something that requires you to be upright or walking.  Talking a brief walk.  Lying down to rest in a neutral-spine position. PROLONGED STANDING WHILE SLIGHTLY LEANING FORWARD  When completing a task that requires you to lean forward while standing in one place for a long time, place either foot up on a stationary 2-4 inch high object to help maintain the best posture. When both feet are on the ground, the lower back tends to lose its slight inward curve. If this curve flattens (or becomes too large), then the back and your other joints will experience too much stress, tire more quickly, and can cause pain. CORRECT STANDING POSTURES Proper standing posture should be assumed with all daily activities, even if they only take a few moments, like when brushing your teeth. As in sitting, your ears should fall over your shoulders and your shoulders should fall over your hips. You should keep a slight tension in your abdominal muscles to brace your spine.  Your tailbone should point down to the ground, not behind your body, resulting in an over-extended swayback posture.  INCORRECT STANDING POSTURES  Common incorrect standing postures include a forward head, locked knees and/or an excessive swayback. WALKING Walk with an upright posture. Your ears, shoulders and hips should all line-up. PROLONGED ACTIVITY IN A FLEXED POSITION When completing a task that requires you to bend forward at your waist or lean over a low surface, try to find a way to stabilize 3 out of 4 of your limbs. You can place a hand or elbow on your thigh or rest a knee on the surface you are reaching across. This will provide you more stability so that your muscles do not fatigue as quickly. By keeping your knees relaxed, or slightly bent, you will also reduce stress across your lower back. CORRECT LIFTING TECHNIQUES DO :   Assume a wide stance. This will provide you more stability and the opportunity to get as close as possible to the object which you are lifting.  Tense your abdominals to brace your spine. Bend at the knees and hips. Keeping your back locked in a neutral-spine position, lift using your leg muscles. Lift with your legs, keeping your back straight.  Test the weight of unknown objects before attempting to lift them.  Try to keep your elbows locked down at your sides in order get the best strength from your shoulders when carrying an object.  Always ask for help when lifting heavy or awkward objects. INCORRECT LIFTING TECHNIQUES DO NOT:   Lock your knees when lifting, even if it is a small object.  Bend and twist. Pivot at your feet or move your feet when needing to change directions.  Assume that you can safely pick up even a paper clip without proper posture.  This information is not intended to replace advice given to you by your health care provider. Make sure you discuss any questions you have with your health care provider.   Document Released:  02/08/2005 Document Revised: 03/01/2014 Document Reviewed: 05/23/2008 [employer] Interactive Patient Education [employer]

## 2015-10-21 ENCOUNTER — Other Ambulatory Visit: Payer: Self-pay | Admitting: Internal Medicine

## 2015-10-24 DIAGNOSIS — M9903 Segmental and somatic dysfunction of lumbar region: Secondary | ICD-10-CM | POA: Diagnosis not present

## 2015-10-24 DIAGNOSIS — M9905 Segmental and somatic dysfunction of pelvic region: Secondary | ICD-10-CM | POA: Diagnosis not present

## 2015-10-24 DIAGNOSIS — M5136 Other intervertebral disc degeneration, lumbar region: Secondary | ICD-10-CM | POA: Diagnosis not present

## 2015-10-24 DIAGNOSIS — M9904 Segmental and somatic dysfunction of sacral region: Secondary | ICD-10-CM | POA: Diagnosis not present

## 2015-10-28 DIAGNOSIS — M5136 Other intervertebral disc degeneration, lumbar region: Secondary | ICD-10-CM | POA: Diagnosis not present

## 2015-10-28 DIAGNOSIS — M9905 Segmental and somatic dysfunction of pelvic region: Secondary | ICD-10-CM | POA: Diagnosis not present

## 2015-10-28 DIAGNOSIS — M9904 Segmental and somatic dysfunction of sacral region: Secondary | ICD-10-CM | POA: Diagnosis not present

## 2015-10-28 DIAGNOSIS — M9903 Segmental and somatic dysfunction of lumbar region: Secondary | ICD-10-CM | POA: Diagnosis not present

## 2015-10-29 ENCOUNTER — Encounter: Payer: Self-pay | Admitting: Internal Medicine

## 2015-10-29 ENCOUNTER — Ambulatory Visit (INDEPENDENT_AMBULATORY_CARE_PROVIDER_SITE_OTHER): Payer: Medicare Other | Admitting: Internal Medicine

## 2015-10-29 VITALS — BP 146/64 | HR 64 | Temp 97.8°F | Resp 16 | Ht 69.5 in | Wt 156.0 lb

## 2015-10-29 DIAGNOSIS — L989 Disorder of the skin and subcutaneous tissue, unspecified: Secondary | ICD-10-CM | POA: Diagnosis not present

## 2015-10-29 MED ORDER — DOXYCYCLINE HYCLATE 100 MG PO CAPS: 100.0000 mg | ORAL_CAPSULE | Freq: Two times a day (BID) | ORAL | 0 refills | Status: DC

## 2015-10-29 MED ORDER — TRIAMCINOLONE ACETONIDE 0.1 % EX CREA: 1.0000 "application " | TOPICAL_CREAM | Freq: Four times a day (QID) | CUTANEOUS | 0 refills | Status: DC | PRN

## 2015-10-29 NOTE — Patient Instructions (Signed)
Please call the office if you have worsening redness that spreads up your leg towards your heart.  Please use steroid cream 3 times daily to help with itching and redness.  Please use warm water soaks or epsom salt soaks.  Please take the doxycycline until it is gone.  Call if you have side effects.

## 2015-10-29 NOTE — Progress Notes (Signed)
   Subjective:    Patient ID: Gerald [name], male    DOB: [birth year], 81 y.o.   MRN: [id number]  HPI  Patient presents to the office for evaluation of left foot lesion.  He reports that this morning at  3 am he developed some itching ot the left foot.  He has been having intermittent lesions to the left foot which has been going for the past 4 weeks.  He reports that he has never had issue with this before.  He had originally gone to an UC for evaluation and he has completed a course of .   Bactrim x 3 weeks. Review of Systems  Constitutional: Negative for chills, fatigue and fever.  Gastrointestinal: Negative for nausea and vomiting.  Skin: Positive for rash and wound.       Objective:   Physical Exam  Constitutional: He appears well-developed and well-nourished. No distress.  HENT:  Head: Normocephalic.  Mouth/Throat: Oropharynx is clear and moist. No oropharyngeal exudate.  Eyes: Conjunctivae are normal. No scleral icterus.  Neck: Normal range of motion. Neck supple. No JVD present. No thyromegaly present.  Cardiovascular: Normal rate, regular rhythm, normal heart sounds and intact distal pulses.  Exam reveals no gallop and no friction rub.   No murmur heard. Pulmonary/Chest: Effort normal and breath sounds normal. No respiratory distress. He has no wheezes. He has no rales. He exhibits no tenderness.  Abdominal: Soft. Bowel sounds are normal. He exhibits no distension and no mass. There is no tenderness. There is no rebound and no guarding.  Musculoskeletal: Normal range of motion.       Feet:  Lymphadenopathy:    He has no cervical adenopathy.  Skin: Skin is warm and dry. Rash noted. He is not diaphoretic.  Psychiatric: He has a normal mood and affect. His behavior is normal. Judgment and thought content normal.  Nursing note and vitals reviewed.    Vitals:   10/29/15 1451  BP: (!) 146/64  Pulse: 64  Resp: 16  Temp: 97.8 F (36.6 C)         Assessment & Plan:     1. Foot lesion -should have been adequately treated with bactrim however still present.  Could be possible localized allergic reaction to adhesive, but unsure of its origin.  Try doxycyline and topical steroid.  Possible corn/ulcer and will have podiatry also look at this.    - doxycycline (VIBRAMYCIN) 100 MG capsule; Take 1 capsule (100 mg total) by mouth 2 (two) times daily. One po bid x 7 days  Dispense: 14 capsule; Refill: 0 - Ambulatory referral to Podiatry - triamcinolone cream (KENALOG) 0.1 %; Apply 1 application topically 4 (four) times daily as needed.  Dispense: 30 g; Refill: 0

## 2015-11-04 ENCOUNTER — Ambulatory Visit (INDEPENDENT_AMBULATORY_CARE_PROVIDER_SITE_OTHER): Payer: Medicare Other | Admitting: Podiatry

## 2015-11-04 ENCOUNTER — Encounter: Payer: Self-pay | Admitting: Podiatry

## 2015-11-04 DIAGNOSIS — L02612 Cutaneous abscess of left foot: Secondary | ICD-10-CM

## 2015-11-04 DIAGNOSIS — T63301A Toxic effect of unspecified spider venom, accidental (unintentional), initial encounter: Secondary | ICD-10-CM

## 2015-11-04 DIAGNOSIS — L989 Disorder of the skin and subcutaneous tissue, unspecified: Secondary | ICD-10-CM

## 2015-11-04 MED ORDER — DOXYCYCLINE HYCLATE 100 MG PO CAPS: 100.0000 mg | ORAL_CAPSULE | Freq: Two times a day (BID) | ORAL | 0 refills | Status: DC

## 2015-11-04 NOTE — Progress Notes (Signed)
   Subjective:    Patient ID: Gerald [name], male    DOB: 09-Feb-1935, 81 y.o.   MRN: [id number]  HPI  81 year old male presents the office today for concerns of possible infection of the left bottom of his foot. He was fitted by spider 4 weeks ago he has been on 3 rounds of antibiotics and he states the area does continue to be red does come back once he finishes the antibiotics. He states the area does itches well. He has a red, purple area in the arch of his foot as well as the top of his foot. He denies any red streaks. He denies any systemic complaints as fevers, chills, nausea, vomiting. No calf pain, chest pain, shortness of breath. No other complaints at this time.   Review of Systems  All other systems reviewed and are negative.      Objective:   Physical Exam General: AAO x3, NAD  Dermatological: On the plantar aspect of the patient's left foot on the medial arch of the foot is an area of erythema, purple discoloration. There does appear to be a small amount of fluid the area however there is no crepitation. There is no ascending synovitis. There is edema to the arch of the foot. There is also small area of erythema and the dorsal aspect of the midfoot. There is no fluctuance or crepitus of this area. There is tenderness to the medial arch of the foot. No ascending synovitis. No malodor. No drainage or pus expressed today.  Vascular: Dorsalis Pedis artery and Posterior Tibial artery pedal pulses are 2/4 bilateral with immedate capillary fill time. Pedal hair growth present. here is no pain with calf compression, swelling, warmth, erythema.   Neruologic: Grossly intact via light touch bilateral. Vibratory intact via tuning fork bilateral. Protective threshold with Semmes Wienstein monofilament intact to all pedal sites bilateral.   Musculoskeletal: Tenderness along the area of concern the medial arch the patient's left foot. No other areas of tenderness. MMT 5/5. Range of motion  intact.  Gait: Unassisted, Nonantalgic.      Assessment & Plan:  81 year old male left foot concern for abscess -Treatment options discussed including all alternatives, risks, and complications -Etiology of symptoms were discussed -At this time recommended MRI to evaluated for deep abscess. This was ordered today. -Continue doxycycline for now. -Discussed the possible incision and drainage pending MRI. -Monitor for signs or symptoms of worsening infection to cure the arch any occur. I educated him on signs or symptoms of infection.  Ovid CurdMatthew Wagoner, DPM

## 2015-11-04 NOTE — Patient Instructions (Signed)
Continue doxycycline MRI has been ordered of your left foot. If you have not heard about scheduling this in the next day to call the office at [phone]

## 2015-11-05 DIAGNOSIS — M7989 Other specified soft tissue disorders: Secondary | ICD-10-CM | POA: Diagnosis not present

## 2015-11-05 DIAGNOSIS — T63301A Toxic effect of unspecified spider venom, accidental (unintentional), initial encounter: Secondary | ICD-10-CM | POA: Diagnosis not present

## 2015-11-05 DIAGNOSIS — M19072 Primary osteoarthritis, left ankle and foot: Secondary | ICD-10-CM | POA: Diagnosis not present

## 2015-11-06 DIAGNOSIS — M5136 Other intervertebral disc degeneration, lumbar region: Secondary | ICD-10-CM | POA: Diagnosis not present

## 2015-11-06 DIAGNOSIS — M9903 Segmental and somatic dysfunction of lumbar region: Secondary | ICD-10-CM | POA: Diagnosis not present

## 2015-11-06 DIAGNOSIS — M9905 Segmental and somatic dysfunction of pelvic region: Secondary | ICD-10-CM | POA: Diagnosis not present

## 2015-11-06 DIAGNOSIS — M9904 Segmental and somatic dysfunction of sacral region: Secondary | ICD-10-CM | POA: Diagnosis not present

## 2015-11-11 ENCOUNTER — Telehealth: Payer: Self-pay

## 2015-11-11 DIAGNOSIS — Z01812 Encounter for preprocedural laboratory examination: Secondary | ICD-10-CM

## 2015-11-11 NOTE — Telephone Encounter (Signed)
Pt has called twice regarding the results of his MRI.

## 2015-11-11 NOTE — Telephone Encounter (Signed)
Misty StanleyLisa called at 4:41pm to give the patient his results which were negative for abscess or osteomyelitis. Continue antibiotics. It is still somewhat red and itches. We will call him Wednesday morning for an appointment to see [place] on [place] in [place]. However, if there are any worsening signs or symptoms of infection to go to the ER or call the office before his scheduled appointment.   His MRI was preformed at [facility].

## 2015-11-18 DIAGNOSIS — L299 Pruritus, unspecified: Secondary | ICD-10-CM | POA: Diagnosis not present

## 2015-11-18 DIAGNOSIS — B353 Tinea pedis: Secondary | ICD-10-CM | POA: Diagnosis not present

## 2015-11-23 ENCOUNTER — Other Ambulatory Visit: Payer: Self-pay | Admitting: Internal Medicine

## 2015-11-26 ENCOUNTER — Ambulatory Visit (INDEPENDENT_AMBULATORY_CARE_PROVIDER_SITE_OTHER): Payer: Medicare Other | Admitting: Internal Medicine

## 2015-11-26 ENCOUNTER — Other Ambulatory Visit: Payer: Self-pay | Admitting: Internal Medicine

## 2015-11-26 ENCOUNTER — Other Ambulatory Visit: Payer: Self-pay | Admitting: *Deleted

## 2015-11-26 ENCOUNTER — Encounter: Payer: Self-pay | Admitting: Internal Medicine

## 2015-11-26 VITALS — BP 132/60 | HR 56 | Temp 97.4°F | Resp 16 | Ht 69.0 in | Wt 155.6 lb

## 2015-11-26 DIAGNOSIS — Z79899 Other long term (current) drug therapy: Secondary | ICD-10-CM | POA: Diagnosis not present

## 2015-11-26 DIAGNOSIS — Z125 Encounter for screening for malignant neoplasm of prostate: Secondary | ICD-10-CM | POA: Diagnosis not present

## 2015-11-26 DIAGNOSIS — I10 Essential (primary) hypertension: Secondary | ICD-10-CM

## 2015-11-26 DIAGNOSIS — E038 Other specified hypothyroidism: Secondary | ICD-10-CM | POA: Diagnosis not present

## 2015-11-26 DIAGNOSIS — I700 Atherosclerosis of aorta: Secondary | ICD-10-CM

## 2015-11-26 DIAGNOSIS — N320 Bladder-neck obstruction: Secondary | ICD-10-CM

## 2015-11-26 DIAGNOSIS — E782 Mixed hyperlipidemia: Secondary | ICD-10-CM | POA: Diagnosis not present

## 2015-11-26 DIAGNOSIS — Z136 Encounter for screening for cardiovascular disorders: Secondary | ICD-10-CM

## 2015-11-26 DIAGNOSIS — E119 Type 2 diabetes mellitus without complications: Secondary | ICD-10-CM | POA: Diagnosis not present

## 2015-11-26 DIAGNOSIS — K219 Gastro-esophageal reflux disease without esophagitis: Secondary | ICD-10-CM | POA: Diagnosis not present

## 2015-11-26 DIAGNOSIS — Z794 Long term (current) use of insulin: Secondary | ICD-10-CM | POA: Diagnosis not present

## 2015-11-26 DIAGNOSIS — Z1212 Encounter for screening for malignant neoplasm of rectum: Secondary | ICD-10-CM

## 2015-11-26 LAB — CBC WITH DIFFERENTIAL/PLATELET
BASOS ABS: 0 {cells}/uL (ref 0–200)
Basophils Relative: 0 %
EOS PCT: 3 %
Eosinophils Absolute: 210 cells/uL (ref 15–500)
HCT: 42 % (ref 38.5–50.0)
Hemoglobin: 14.4 g/dL (ref 13.2–17.1)
Lymphocytes Relative: 27 %
Lymphs Abs: 1890 cells/uL (ref 850–3900)
MCH: 30.8 pg (ref 27.0–33.0)
MCHC: 34.3 g/dL (ref 32.0–36.0)
MCV: 89.7 fL (ref 80.0–100.0)
MONOS PCT: 8 %
MPV: 9.3 fL (ref 7.5–12.5)
Monocytes Absolute: 560 cells/uL (ref 200–950)
NEUTROS ABS: 4340 {cells}/uL (ref 1500–7800)
NEUTROS PCT: 62 %
PLATELETS: 159 10*3/uL (ref 140–400)
RBC: 4.68 MIL/uL (ref 4.20–5.80)
RDW: 13.9 % (ref 11.0–15.0)
WBC: 7 10*3/uL (ref 3.8–10.8)

## 2015-11-26 LAB — BASIC METABOLIC PANEL WITH GFR
BUN: 35 mg/dL — ABNORMAL HIGH (ref 7–25)
CALCIUM: 9.6 mg/dL (ref 8.6–10.3)
CO2: 22 mmol/L (ref 20–31)
CREATININE: 1.29 mg/dL — AB (ref 0.70–1.18)
Chloride: 104 mmol/L (ref 98–110)
GFR, EST AFRICAN AMERICAN: 61 mL/min (ref >=60)
GFR, EST NON AFRICAN AMERICAN: 53 mL/min — AB (ref >=60)
Glucose, Bld: 107 mg/dL — ABNORMAL HIGH (ref 65–99)
Potassium: 4.2 mmol/L (ref 3.5–5.3)
SODIUM: 139 mmol/L (ref 135–146)

## 2015-11-26 LAB — HEPATIC FUNCTION PANEL
ALT: 30 U/L (ref 9–46)
AST: 35 U/L (ref 10–35)
Albumin: 4.2 g/dL (ref 3.6–5.1)
Alkaline Phosphatase: 42 U/L (ref 40–115)
BILIRUBIN DIRECT: 0.1 mg/dL (ref <=0.2)
BILIRUBIN INDIRECT: 0.6 mg/dL (ref 0.2–1.2)
BILIRUBIN TOTAL: 0.7 mg/dL (ref 0.2–1.2)
Total Protein: 6 g/dL — ABNORMAL LOW (ref 6.1–8.1)

## 2015-11-26 LAB — LIPID PANEL
CHOL/HDL RATIO: 3.3 ratio (ref <=5.0)
CHOLESTEROL: 134 mg/dL (ref 125–200)
HDL: 41 mg/dL (ref >=40)
LDL Cholesterol: 62 mg/dL (ref <130)
TRIGLYCERIDES: 157 mg/dL — AB (ref <150)
VLDL: 31 mg/dL — ABNORMAL HIGH (ref <30)

## 2015-11-26 LAB — MAGNESIUM: MAGNESIUM: 1.8 mg/dL (ref 1.5–2.5)

## 2015-11-26 LAB — TSH: TSH: 0.98 mIU/L (ref 0.40–4.50)

## 2015-11-26 MED ORDER — BACLOFEN 10 MG PO TABS: ORAL_TABLET | ORAL | 1 refills | Status: DC

## 2015-11-26 NOTE — Progress Notes (Signed)
[facility]   Gerald [name], M.D.    Dyanne Carrel. Steffanie Dunn, P.A.-C      Terri Piedra, P.A.-C  [facility]                [address]                [place], [place]. [address] Telephone [phone] Telefax [phone]  Comprehensive Evaluation & Examination     This very nice 81 y.o. MWM presents for a  comprehensive evaluation and management of multiple medical co-morbidities.  Patient has been followed for HTN, Insulin req T2_DM  Prediabetes, Hyperlipidemia and Vitamin D Deficiency. Patient also has GERD controlled w/diet & meds.      HTN predates since 1989. Patient's BP has been controlled at home. Today's BP is  132/60. Patient denies any cardiac symptoms as chest pain, palpitations, shortness of breath, dizziness or ankle swelling.     Patient's hyperlipidemia is controlled with diet and medications. Patient denies myalgias or other medication SE's. Last lipids were at goal: Lab Results  Component Value Date   CHOL 119 (L) 08/18/2015   HDL 47 08/18/2015   LDLCALC 48 08/18/2015   TRIG 119 08/18/2015   CHOLHDL 2.5 08/18/2015      Patient has T2_DM since 1999 and now is on insulin for control.  Patient denies reactive hypoglycemic symptoms, visual blurring, diabetic polys or paresthesias.  He reports FBG's range betw 90-130 mg%Last A1c was not at goal:  Lab Results  Component Value Date   HGBA1C 7.0 (H) 07/16/2015       Finally, patient has history of Vitamin D Deficiency and last vitamin D was at goal: Lab Results  Component Value Date   VD25OH 63 05/12/2015   Current Outpatient Prescriptions on File Prior to Visit  Medication Sig  . aspirin 81 MG tablet Take by mouth daily.   Marland Kitchen atenolol (TENORMIN) 50 MG tablet Take 2 tablets daily for BP.  . baclofen (LIORESAL) 10 MG tablet TAKE 1/2-1 TABLET BY MOUTH 2-3 TIMES A DAY AS NEEDED FOR MUSCLE SPASMS  . celecoxib (CELEBREX) 200 MG capsule TAKE 1 CAPSULE BY  MOUTH DAILY FOR PAIN AND INFLAMMATION  . Cholecalciferol (VITAMIN D PO) Take 5,000 Units by mouth daily.  Marland Kitchen ezetimibe (ZETIA) 10 MG tablet TAKE 1 TABLET BY MOUTH DAILY  . finasteride (PROSCAR) 5 MG tablet TAKE 1 TABLET BY MOUTH DAILY  . fish oil-omega-3 fatty acids 1000 MG capsule Take 1 g by mouth daily.   . Insulin Pen Needle (BD PEN NEEDLE NANO U/F) 32G X 4 MM MISC Test sugars 2 times daily  Dx: E11.9  . LANTUS SOLOSTAR 100 UNIT/ML Solostar Pen USE AS DIRECTED  . levothyroxine (SYNTHROID, LEVOTHROID) 150 MCG tablet TAKE 1 TABLET BY MOUTH EVERY MORNING  . losartan-hydrochlorothiazide (HYZAAR) 100-25 MG tablet TAKE 1 TABLET BY MOUTH EVERY DAY FOR HIGH BLOOD PRESSURE  . metFORMIN (GLUCOPHAGE-XR) 500 MG 24 hr tablet TAKE 4 TABLETS BY MOUTH EVERY DAY FOR DIABETES  . minocycline (MINOCIN,DYNACIN) 100 MG capsule TAKE 1 CAPSULE BY MOUTH DAILY  . mirabegron ER (MYRBETRIQ) 50 MG TB24 tablet Take 1 tablet daily for bladder  . Multiple Vitamin (MULTIVITAMIN) tablet Take 1 tablet by mouth daily.  . pantoprazole (PROTONIX) 40 MG tablet TAKE 1 TABLET BY MOUTH DAILY  . simvastatin (ZOCOR) 40 MG tablet TAKE 1 TABLET BY MOUTH EVERY NIGHT AT BEDTIME FOR CHOLESTEROL  . traMADol (ULTRAM) 50 MG tablet Take 1  tablet (50 mg total) by mouth every 6 (six) hours as needed for moderate pain.  Marland Kitchen. triamcinolone cream (KENALOG) 0.1 % Apply 1 application topically 4 (four) times daily as needed.   No current facility-administered medications on file prior to visit.    Allergies  Allergen Reactions  . Ace Inhibitors    Past Medical History:  Diagnosis Date  . BPH (benign prostatic hypertrophy)   . GERD (gastroesophageal reflux disease)   . Rotator cuff tear, right RECURRENT  . Vitamin D deficiency    Health Maintenance  Topic Date Due  . FOOT EXAM  11/01/2015  . HEMOGLOBIN A1C  01/16/2016  . OPHTHALMOLOGY EXAM  07/03/2016  . TETANUS/TDAP  10/31/2024  . INFLUENZA VACCINE  Completed  . ZOSTAVAX  Completed  .  PNA vac Low Risk Adult  Completed   Immunization History  Administered Date(s) Administered  . DT 11/01/2014  . Influenza Whole 11/10/2012  . Influenza, High Dose Seasonal PF 12/03/2013, 11/01/2014, 10/16/2015  . Pneumococcal Conjugate-13 12/03/2013  . Pneumococcal Polysaccharide-23 07/27/2011  . Tdap 02/23/2004  . Zoster 07/15/2005   Past Surgical History:  Procedure Laterality Date  . LUMBAR MICRODISCECTOMY  04-06-1999   L4 - 5  . RIGHT SHOULDER ARTHROSCOPY/ LABRAL & BICEPS TENDON DEBRIDEMENT/ DISTAL CLAVICLE DECOMPRESSION/ SAD/ MINI OPEN ROTATOR CUFF REPAIR  03-09-2010  . SHOULDER ARTHROSCOPY  04-10-2004   LEFT  . SHOULDER OPEN ROTATOR CUFF REPAIR  06/09/2011   Procedure: ROTATOR CUFF REPAIR SHOULDER OPEN;  Surgeon: Drucilla SchmidtJames P Aplington, MD;  Location: [facility];  Service: Orthopedics;  Laterality: Right;  anterior acromionectomy repair right rotator cuff with tissue mend    Family History  Problem Relation Age of Onset  . Heart disease Mother   . Heart disease Father   . Stroke Father   . Cancer Father   . Cancer Sister   . Diabetes Maternal Grandmother   . Hypertension Sister   . Colon cancer Neg Hx   . Pancreatic cancer Neg Hx   . Stomach cancer Neg Hx   . Rectal cancer Neg Hx    Social History   Social History  . Marital status: Married    Spouse name: N/A  . Number of children: N/A  . Years of education: N/A   Occupational History  . Retired    Social History Main Topics  . Smoking status: Former Smoker    Types: Cigarettes    Quit date: 06/03/1990  . Smokeless tobacco: Never Used  . Alcohol use No     Comment: OCCASIONAL , WINE-no longer drinks alcohol  . Drug use: No  . Sexual activity: No     ROS Constitutional: Denies fever, chills, weight loss/gain, headaches, insomnia,  night sweats or change in appetite. Does c/o fatigue. Eyes: Denies redness, blurred vision, diplopia, discharge, itchy or watery eyes.  ENT: Denies discharge,  congestion, post nasal drip, epistaxis, sore throat, earache, hearing loss, dental pain, Tinnitus, Vertigo, Sinus pain or snoring.  Cardio: Denies chest pain, palpitations, irregular heartbeat, syncope, dyspnea, diaphoresis, orthopnea, PND, claudication or edema Respiratory: denies cough, dyspnea, DOE, pleurisy, hoarseness, laryngitis or wheezing.  Gastrointestinal: Denies dysphagia, heartburn, reflux, water brash, pain, cramps, nausea, vomiting, bloating, diarrhea, constipation, hematemesis, melena, hematochezia, jaundice or hemorrhoids Genitourinary: Denies dysuria, frequency, urgency, nocturia, hesitancy, discharge, hematuria or flank pain Musculoskeletal: Denies arthralgia, myalgia, stiffness, Jt. Swelling, pain, limp or strain/sprain. Denies Falls. Skin: Denies puritis, rash, hives, warts, acne, eczema or change in skin lesion Neuro: No weakness, tremor, incoordination,  spasms, paresthesia or pain Psychiatric: Denies confusion, memory loss or sensory loss. Denies Depression. Endocrine: Denies change in weight, skin, hair change, nocturia, and paresthesia, diabetic polys, visual blurring or hyper / hypo glycemic episodes.  Heme/Lymph: No excessive bleeding, bruising or enlarged lymph nodes.  Physical Exam  BP 132/60   Pulse (!) 56   Temp 97.4 F (36.3 C)   Resp 16   Ht 5\' 9"  (1.753 m)   Wt 155 lb 9.6 oz (70.6 kg)   BMI 22.98 kg/m   General Appearance: Well nourished, in no apparent distress.  Eyes: PERRLA, EOMs, conjunctiva no swelling or erythema, normal fundi and vessels. Sinuses: No frontal/maxillary tenderness ENT/Mouth: EACs patent / TMs  nl. Nares clear without erythema, swelling, mucoid exudates. Oral hygiene is good. No erythema, swelling, or exudate. Tongue normal, non-obstructing. Tonsils not swollen or erythematous. Hearing normal.  Neck: Supple, thyroid normal. No bruits, nodes or JVD. Respiratory: Respiratory effort normal.  BS equal and clear bilateral without rales,  rhonci, wheezing or stridor. Cardio: Heart sounds are normal with regular rate and rhythm and no murmurs, rubs or gallops. Peripheral pulses are normal and equal bilaterally without edema. No aortic or femoral bruits. Chest: symmetric with normal excursions and percussion.  Abdomen: Soft, with Nl bowel sounds. Nontender, no guarding, rebound, hernias, masses, or organomegaly.  Lymphatics: Non tender without lymphadenopathy.  Genitourinary: No hernias.Testes nl. DRE - prostate nl for age - smooth & firm w/o nodules. Musculoskeletal: Full ROM all peripheral extremities, joint stability, 5/5 strength, and normal gait. Skin: Warm and dry without rashes, lesions, cyanosis, clubbing or  ecchymosis.  Neuro: Cranial nerves intact, reflexes equal bilaterally. Normal muscle tone, no cerebellar symptoms. Sensation intact to touch, Vibratory and monofilament testing to the toes bilaterally. Does demonstrate mild dysnomia.  Pysch: Alert and oriented X 3 with normal affect, insight and judgment appropriate.   Assessment and Plan  1. Essential hypertension  - EKG 12-Lead - [place], RETROPERITNL ABD,  LTD - TSH  2. Mixed hyperlipidemia  - EKG 12-Lead - [place], RETROPERITNL ABD,  LTD - Lipid panel - TSH  3. Insulin-requiring or dependent type II diabetes mellitus (HCC)  - Microalbumin / creatinine urine ratio - EKG 12-Lead - [place], RETROPERITNL ABD,  LTD - HM DIABETES FOOT EXAM - LOW EXTREMITY NEUR EXAM DOCUM - Hemoglobin A1c  4. Gastroesophageal reflux disease  - VITAMIN D 25 Hydroxy   5. Hypothyroidism  - TSH  6. Thoracic aorta atherosclerosis (HCC)  - [place], RETROPERITNL ABD,  LTD  7. Screening for rectal cancer  - POC Hemoccult Bld/Stl   8. Prostate cancer screening  - PSA  9. Bladder neck obstruction   10. Screening for AAA (aortic abdominal aneurysm)  - [place], RETROPERITNL ABD,  LTD  11. Screening for ischemic heart disease  - EKG 12-Lead  12. Medication management  -  Urinalysis, Routine w reflex microscopic  - CBC with Differential/Platelet - BASIC METABOLIC PANEL WITH GFR - Hepatic function panel - Magnesium       Continue prudent diet as discussed, weight control, BP monitoring, regular exercise, and medications as discussed.  Discussed med effects and SE's. Routine screening labs and tests as requested with regular follow-up as recommended. Over 40 minutes of exam, counseling, chart review and high complex critical decision making was performed

## 2015-11-26 NOTE — Patient Instructions (Signed)

## 2015-11-27 LAB — URINALYSIS, ROUTINE W REFLEX MICROSCOPIC
Bilirubin Urine: NEGATIVE
GLUCOSE, UA: NEGATIVE
HGB URINE DIPSTICK: NEGATIVE
Ketones, ur: NEGATIVE
LEUKOCYTES UA: NEGATIVE
NITRITE: NEGATIVE
PROTEIN: NEGATIVE
Specific Gravity, Urine: 1.02 (ref 1.001–1.035)
pH: 5 (ref 5.0–8.0)

## 2015-11-27 LAB — MICROALBUMIN / CREATININE URINE RATIO
Creatinine, Urine: 149 mg/dL (ref 20–370)
MICROALB UR: 0.6 mg/dL
MICROALB/CREAT RATIO: 4 ug/mg{creat} (ref <30)

## 2015-11-27 LAB — VITAMIN D 25 HYDROXY (VIT D DEFICIENCY, FRACTURES): Vit D, 25-Hydroxy: 70 ng/mL (ref 30–100)

## 2015-11-27 LAB — PSA: PSA: 0.1 ng/mL (ref <=4.0)

## 2015-11-27 LAB — HEMOGLOBIN A1C
HEMOGLOBIN A1C: 6.5 % — AB (ref <5.7)
Mean Plasma Glucose: 140 mg/dL

## 2015-12-04 DIAGNOSIS — M5136 Other intervertebral disc degeneration, lumbar region: Secondary | ICD-10-CM | POA: Diagnosis not present

## 2015-12-04 DIAGNOSIS — M9903 Segmental and somatic dysfunction of lumbar region: Secondary | ICD-10-CM | POA: Diagnosis not present

## 2015-12-04 DIAGNOSIS — M9904 Segmental and somatic dysfunction of sacral region: Secondary | ICD-10-CM | POA: Diagnosis not present

## 2015-12-04 DIAGNOSIS — M9905 Segmental and somatic dysfunction of pelvic region: Secondary | ICD-10-CM | POA: Diagnosis not present

## 2015-12-04 DIAGNOSIS — L299 Pruritus, unspecified: Secondary | ICD-10-CM | POA: Diagnosis not present

## 2015-12-04 DIAGNOSIS — B353 Tinea pedis: Secondary | ICD-10-CM | POA: Diagnosis not present

## 2015-12-10 ENCOUNTER — Other Ambulatory Visit: Payer: Self-pay | Admitting: Internal Medicine

## 2015-12-19 DIAGNOSIS — M9905 Segmental and somatic dysfunction of pelvic region: Secondary | ICD-10-CM | POA: Diagnosis not present

## 2015-12-19 DIAGNOSIS — M9904 Segmental and somatic dysfunction of sacral region: Secondary | ICD-10-CM | POA: Diagnosis not present

## 2015-12-19 DIAGNOSIS — M9903 Segmental and somatic dysfunction of lumbar region: Secondary | ICD-10-CM | POA: Diagnosis not present

## 2015-12-19 DIAGNOSIS — M5136 Other intervertebral disc degeneration, lumbar region: Secondary | ICD-10-CM | POA: Diagnosis not present

## 2015-12-22 DIAGNOSIS — M5136 Other intervertebral disc degeneration, lumbar region: Secondary | ICD-10-CM | POA: Diagnosis not present

## 2015-12-22 DIAGNOSIS — M9905 Segmental and somatic dysfunction of pelvic region: Secondary | ICD-10-CM | POA: Diagnosis not present

## 2015-12-22 DIAGNOSIS — M9904 Segmental and somatic dysfunction of sacral region: Secondary | ICD-10-CM | POA: Diagnosis not present

## 2015-12-22 DIAGNOSIS — M9903 Segmental and somatic dysfunction of lumbar region: Secondary | ICD-10-CM | POA: Diagnosis not present

## 2015-12-24 ENCOUNTER — Other Ambulatory Visit: Payer: Self-pay | Admitting: Internal Medicine

## 2015-12-29 DIAGNOSIS — M9904 Segmental and somatic dysfunction of sacral region: Secondary | ICD-10-CM | POA: Diagnosis not present

## 2015-12-29 DIAGNOSIS — M9905 Segmental and somatic dysfunction of pelvic region: Secondary | ICD-10-CM | POA: Diagnosis not present

## 2015-12-29 DIAGNOSIS — M5136 Other intervertebral disc degeneration, lumbar region: Secondary | ICD-10-CM | POA: Diagnosis not present

## 2015-12-29 DIAGNOSIS — M9903 Segmental and somatic dysfunction of lumbar region: Secondary | ICD-10-CM | POA: Diagnosis not present

## 2016-01-02 ENCOUNTER — Other Ambulatory Visit: Payer: Self-pay | Admitting: *Deleted

## 2016-01-02 DIAGNOSIS — Z1212 Encounter for screening for malignant neoplasm of rectum: Secondary | ICD-10-CM

## 2016-01-02 LAB — POC HEMOCCULT BLD/STL (HOME/3-CARD/SCREEN)
Card #3 Fecal Occult Blood, POC: NEGATIVE
FECAL OCCULT BLD: NEGATIVE
FECAL OCCULT BLD: NEGATIVE

## 2016-01-07 DIAGNOSIS — M5136 Other intervertebral disc degeneration, lumbar region: Secondary | ICD-10-CM | POA: Diagnosis not present

## 2016-01-07 DIAGNOSIS — E119 Type 2 diabetes mellitus without complications: Secondary | ICD-10-CM | POA: Diagnosis not present

## 2016-01-07 DIAGNOSIS — M9905 Segmental and somatic dysfunction of pelvic region: Secondary | ICD-10-CM | POA: Diagnosis not present

## 2016-01-07 DIAGNOSIS — M9903 Segmental and somatic dysfunction of lumbar region: Secondary | ICD-10-CM | POA: Diagnosis not present

## 2016-01-07 DIAGNOSIS — M9904 Segmental and somatic dysfunction of sacral region: Secondary | ICD-10-CM | POA: Diagnosis not present

## 2016-02-06 ENCOUNTER — Other Ambulatory Visit: Payer: Self-pay | Admitting: Internal Medicine

## 2016-02-09 ENCOUNTER — Other Ambulatory Visit: Payer: Self-pay | Admitting: Physician Assistant

## 2016-02-10 ENCOUNTER — Other Ambulatory Visit: Payer: Self-pay | Admitting: Internal Medicine

## 2016-02-21 ENCOUNTER — Other Ambulatory Visit: Payer: Self-pay | Admitting: Internal Medicine

## 2016-03-04 ENCOUNTER — Ambulatory Visit (INDEPENDENT_AMBULATORY_CARE_PROVIDER_SITE_OTHER): Payer: Medicare Other | Admitting: Internal Medicine

## 2016-03-04 ENCOUNTER — Encounter: Payer: Self-pay | Admitting: Internal Medicine

## 2016-03-04 VITALS — BP 194/86 | HR 56 | Temp 97.8°F | Resp 16 | Ht 69.0 in | Wt 158.0 lb

## 2016-03-04 DIAGNOSIS — Z79899 Other long term (current) drug therapy: Secondary | ICD-10-CM | POA: Diagnosis not present

## 2016-03-04 DIAGNOSIS — N400 Enlarged prostate without lower urinary tract symptoms: Secondary | ICD-10-CM

## 2016-03-04 DIAGNOSIS — E039 Hypothyroidism, unspecified: Secondary | ICD-10-CM | POA: Diagnosis not present

## 2016-03-04 DIAGNOSIS — R6889 Other general symptoms and signs: Secondary | ICD-10-CM | POA: Diagnosis not present

## 2016-03-04 DIAGNOSIS — I700 Atherosclerosis of aorta: Secondary | ICD-10-CM | POA: Diagnosis not present

## 2016-03-04 DIAGNOSIS — E119 Type 2 diabetes mellitus without complications: Secondary | ICD-10-CM

## 2016-03-04 DIAGNOSIS — M159 Polyosteoarthritis, unspecified: Secondary | ICD-10-CM

## 2016-03-04 DIAGNOSIS — R413 Other amnesia: Secondary | ICD-10-CM

## 2016-03-04 DIAGNOSIS — Z794 Long term (current) use of insulin: Secondary | ICD-10-CM | POA: Diagnosis not present

## 2016-03-04 DIAGNOSIS — N182 Chronic kidney disease, stage 2 (mild): Secondary | ICD-10-CM

## 2016-03-04 DIAGNOSIS — Z0001 Encounter for general adult medical examination with abnormal findings: Secondary | ICD-10-CM

## 2016-03-04 DIAGNOSIS — E559 Vitamin D deficiency, unspecified: Secondary | ICD-10-CM

## 2016-03-04 DIAGNOSIS — E1122 Type 2 diabetes mellitus with diabetic chronic kidney disease: Secondary | ICD-10-CM

## 2016-03-04 DIAGNOSIS — I10 Essential (primary) hypertension: Secondary | ICD-10-CM

## 2016-03-04 DIAGNOSIS — E782 Mixed hyperlipidemia: Secondary | ICD-10-CM | POA: Diagnosis not present

## 2016-03-04 DIAGNOSIS — K219 Gastro-esophageal reflux disease without esophagitis: Secondary | ICD-10-CM

## 2016-03-04 DIAGNOSIS — M75122 Complete rotator cuff tear or rupture of left shoulder, not specified as traumatic: Secondary | ICD-10-CM

## 2016-03-04 DIAGNOSIS — M150 Primary generalized (osteo)arthritis: Secondary | ICD-10-CM

## 2016-03-04 DIAGNOSIS — Z0000 Encounter for general adult medical examination without abnormal findings: Secondary | ICD-10-CM

## 2016-03-04 LAB — CBC WITH DIFFERENTIAL/PLATELET
BASOS ABS: 62 {cells}/uL (ref 0–200)
Basophils Relative: 1 %
Eosinophils Absolute: 248 cells/uL (ref 15–500)
Eosinophils Relative: 4 %
HEMATOCRIT: 45 % (ref 38.5–50.0)
HEMOGLOBIN: 15.2 g/dL (ref 13.2–17.1)
LYMPHS ABS: 1922 {cells}/uL (ref 850–3900)
LYMPHS PCT: 31 %
MCH: 31.2 pg (ref 27.0–33.0)
MCHC: 33.8 g/dL (ref 32.0–36.0)
MCV: 92.4 fL (ref 80.0–100.0)
MONO ABS: 434 {cells}/uL (ref 200–950)
MPV: 8.8 fL (ref 7.5–12.5)
Monocytes Relative: 7 %
NEUTROS PCT: 57 %
Neutro Abs: 3534 cells/uL (ref 1500–7800)
Platelets: 147 10*3/uL (ref 140–400)
RBC: 4.87 MIL/uL (ref 4.20–5.80)
RDW: 13.7 % (ref 11.0–15.0)
WBC: 6.2 10*3/uL (ref 3.8–10.8)

## 2016-03-04 LAB — LIPID PANEL
CHOL/HDL RATIO: 3.1 ratio (ref <5.0)
Cholesterol: 141 mg/dL (ref <200)
HDL: 45 mg/dL (ref >40)
LDL Cholesterol: 60 mg/dL (ref <100)
Triglycerides: 180 mg/dL — ABNORMAL HIGH (ref <150)
VLDL: 36 mg/dL — ABNORMAL HIGH (ref <30)

## 2016-03-04 LAB — HEPATIC FUNCTION PANEL
ALBUMIN: 4.3 g/dL (ref 3.6–5.1)
ALK PHOS: 50 U/L (ref 40–115)
ALT: 33 U/L (ref 9–46)
AST: 33 U/L (ref 10–35)
Bilirubin, Direct: 0.1 mg/dL (ref <=0.2)
Indirect Bilirubin: 0.4 mg/dL (ref 0.2–1.2)
TOTAL PROTEIN: 6.5 g/dL (ref 6.1–8.1)
Total Bilirubin: 0.5 mg/dL (ref 0.2–1.2)

## 2016-03-04 LAB — BASIC METABOLIC PANEL WITH GFR
BUN: 22 mg/dL (ref 7–25)
CHLORIDE: 103 mmol/L (ref 98–110)
CO2: 27 mmol/L (ref 20–31)
Calcium: 9.5 mg/dL (ref 8.6–10.3)
Creat: 1.15 mg/dL (ref 0.70–1.18)
GFR, Est African American: 70 mL/min (ref >=60)
GFR, Est Non African American: 60 mL/min (ref >=60)
Glucose, Bld: 161 mg/dL — ABNORMAL HIGH (ref 65–99)
POTASSIUM: 4.8 mmol/L (ref 3.5–5.3)
Sodium: 137 mmol/L (ref 135–146)

## 2016-03-04 LAB — TSH: TSH: 4.34 mIU/L (ref 0.40–4.50)

## 2016-03-04 NOTE — Patient Instructions (Signed)
Please start taking 30 units of lantus in the morning at your usual time.  Please take 20 units of lantus at bedtime.

## 2016-03-04 NOTE — Progress Notes (Signed)
MEDICARE ANNUAL WELLNESS VISIT AND FOLLOW UP Assessment:    1. Essential hypertension -not sure that patient actually took medications today or that he is taking that at home given new memory issues.  We will send out home health and get several BP readings at home and will adjust medications accordingly.   -cont meds -dash diet -exercise as tolerated - TSH  2. Thoracic aorta atherosclerosis (HCC) -is on statin therapy -try to keep BP under 140/85  3. Gastroesophageal reflux disease, esophagitis presence not specified -cont PPI -avoid trigger foods  4. CKD stage 2 due to type 2 diabetes mellitus (HCC) -adjust insulin so we are doing 30 units in the morning and 20 units at night to help with higher morning sugars -diet and exercise -cont other oral medications  5. Hypothyroidism, unspecified type -cont levothryoxine -dose adjust if necessary - TSH  6. Insulin Dependent T2_IDDM -cont insulin -cont oral medications -check CBGs BID - Hemoglobin A1c  7. Complete rotator cuff rupture of left shoulder -not desiring surgical intervention at this time  8. Primary osteoarthritis involving multiple joints -cellebrex  9. Benign prostatic hyperplasia without lower urinary tract symptoms -cont finasteride -followed by urology  10. Medication management  - CBC with Differential/Platelet - BASIC METABOLIC PANEL WITH GFR - Hepatic function panel  11. Mixed hyperlipidemia -cont simvistatin - Lipid panel  12. Vitamin D deficiency -cont Vit D  13. Memory loss -Likely need to get B12, folate, head CT -already have TSH ordered -no evidence of depression -does have memory changes that I can identify as a provider who does not see him frequently. -have written really clear instructions for him today -consider referral to neurology  14. Medicare annual wellness visit, subsequent -due next year   Over 30 minutes of exam, counseling, chart review, and critical decision  making was performed  Future Appointments Date Time Provider Department Center  06/11/2016 9:30 AM Lucky Cowboy, MD [facility] None  12/24/2016 9:00 AM Lucky Cowboy, MD [facility] None     Plan:   During the course of the visit the patient was educated and counseled about appropriate screening and preventive services including:    Pneumococcal vaccine   Influenza vaccine  Prevnar 13  Td vaccine  Screening electrocardiogram  Colorectal cancer screening  Diabetes screening  Glaucoma screening  Nutrition counseling    Subjective:  Gerald [name] is a 82 y.o. male who presents for Medicare Annual Wellness Visit and 3 month follow up for HTN, hyperlipidemia, prediabetes, and vitamin D Def.   His blood pressure has been controlled at home, today their BP is BP: (!) 194/86 He does not workout. He denies chest pain, shortness of breath, dizziness. He reports that he does check them at home.  He usually gets some numbers sometimes around the 130's.  It was really good for a while and now he is having trouble with it again.  He thinks he took his medication today but he is not sure.     He is on cholesterol medication and denies myalgias. His cholesterol is at goal. The cholesterol last visit was:   Lab Results  Component Value Date   CHOL 134 11/26/2015   HDL 41 11/26/2015   LDLCALC 62 11/26/2015   TRIG 157 (H) 11/26/2015   CHOLHDL 3.3 11/26/2015   He reports that he is having some trouble with morning blood sugars lately.  He got a 140 this morning for his blood sugar.  He is currently taking lantus 35 units in  the moring and then 15 units in the evening.  He reports that he is eating regularly.  He reports that his wife keeps him on top of these things.  He has been eating a little bit more lately.     Lab Results  Component Value Date   HGBA1C 6.5 (H) 11/26/2015   Last GFR Lab Results  Component Value Date   GFRNONAA 53 (L) 11/26/2015     Lab Results   Component Value Date   GFRAA 61 11/26/2015   Patient is on Vitamin D supplement.   Lab Results  Component Value Date   VD25OH 38 11/26/2015     He rpeorts that the sore on his foot is finally better.  He has also been seeing podiatry who has been helping him to care for his feet.    Medication Review: Current Outpatient Prescriptions on File Prior to Visit  Medication Sig Dispense Refill  . aspirin 81 MG tablet Take by mouth daily.     Marland Kitchen atenolol (TENORMIN) 50 MG tablet TAKE 2 TABLETS BY MOUTH EVERY DAY 180 tablet 1  . baclofen (LIORESAL) 10 MG tablet TAKE 1/2 TO 1 TABLET BY MOUTH 2 TO 3 TIMES A DAY AS NEEDED FOR MUSCLE SPASMS 270 tablet 1  . celecoxib (CELEBREX) 200 MG capsule TAKE 1 CAPSULE BY MOUTH DAILY FOR PAIN AND INFLAMMATION 90 capsule 1  . Cholecalciferol (VITAMIN D PO) Take 5,000 Units by mouth daily.    Marland Kitchen ezetimibe (ZETIA) 10 MG tablet TAKE 1 TABLET BY MOUTH DAILY 30 tablet 2  . finasteride (PROSCAR) 5 MG tablet TAKE 1 TABLET BY MOUTH DAILY 90 tablet 1  . finasteride (PROSCAR) 5 MG tablet TAKE 1 TABLET BY MOUTH DAILY 90 tablet 0  . fish oil-omega-3 fatty acids 1000 MG capsule Take 1 g by mouth daily.     . Insulin Pen Needle (BD PEN NEEDLE NANO U/F) 32G X 4 MM MISC Test sugars 2 times daily  Dx: E11.9 100 each 12  . LANTUS SOLOSTAR 100 UNIT/ML Solostar Pen USE AS DIRECTED 45 mL 5  . levothyroxine (SYNTHROID, LEVOTHROID) 150 MCG tablet TAKE 1 TABLET BY MOUTH EVERY MORNING 90 tablet 0  . losartan-hydrochlorothiazide (HYZAAR) 100-25 MG tablet TAKE 1 TABLET BY MOUTH EVERY DAY FOR HIGH BLOOD PRESSURE 90 tablet 1  . metFORMIN (GLUCOPHAGE-XR) 500 MG 24 hr tablet TAKE 4 TABLETS BY MOUTH EVERY DAY FOR DIABETES 360 tablet 3  . minocycline (MINOCIN,DYNACIN) 100 MG capsule TAKE 1 CAPSULE BY MOUTH DAILY 90 capsule 3  . mirabegron ER (MYRBETRIQ) 50 MG TB24 tablet Take 1 tablet daily for bladder 90 tablet 1  . Multiple Vitamin (MULTIVITAMIN) tablet Take 1 tablet by mouth daily.    .  pantoprazole (PROTONIX) 40 MG tablet TAKE 1 TABLET BY MOUTH DAILY 90 tablet 1  . simvastatin (ZOCOR) 40 MG tablet TAKE 1 TABLET BY MOUTH EVERY NIGHT AT BEDTIME FOR CHOLESTEROL 90 tablet PRN  . traMADol (ULTRAM) 50 MG tablet Take 1 tablet (50 mg total) by mouth every 6 (six) hours as needed for moderate pain. 90 tablet 0   No current facility-administered medications on file prior to visit.     Allergies: Allergies  Allergen Reactions  . Ace Inhibitors     Current Problems (verified) has Essential hypertension; Mixed hyperlipidemia; CKD stage 2 due to type 2 diabetes mellitus (HCC); Vitamin D deficiency; Medication management; Hypothyroidism; GERD ; Insulin Dependent T2_IDDM; Benign prostatic hyperplasia; DJD (degenerative joint disease); Medicare annual wellness visit, subsequent; Complete  rotator cuff rupture of left shoulder; Thoracic aorta atherosclerosis (HCC); and Spider bite on his problem list.  Screening Tests Immunization History  Administered Date(s) Administered  . DT 11/01/2014  . Influenza Whole 11/10/2012  . Influenza, High Dose Seasonal PF 12/03/2013, 11/01/2014, 10/16/2015  . Pneumococcal Conjugate-13 12/03/2013  . Pneumococcal Polysaccharide-23 07/27/2011  . Tdap 02/23/2004  . Zoster 07/15/2005    Preventative care: Last colonoscopy: 2015  Prior vaccinations: TD or Tdap: 2006  Influenza: 2017  Pneumococcal: 2013 Prevnar13:2015  Shingles/Zostavax: 2007  Names of Other Physician/Practitioners you currently use: 1. [facility] here for primary care 2. Dr. Emily FilbertGould, eye doctor, last visit 2017 3. Dr. Carleene CooperJim Lemons dentist, last visit 2017 Patient Care Team: Lucky CowboyWilliam McKeown, MD as PCP - General (Internal Medicine) Louis Meckelobert D Kaplan, MD as Consulting Physician (Gastroenterology) Jacqualyn PoseyEugene Lewis, DC (Chiropractic Medicine) Elise BenneSigmund Gould, MD as Consulting Physician (Ophthalmology)  Surgical: He  has a past surgical history that  includes RIGHT SHOULDER ARTHROSCOPY/ LABRAL & BICEPS TENDON DEBRIDEMENT/ DISTAL CLAVICLE DECOMPRESSION/ SAD/ MINI OPEN ROTATOR CUFF REPAIR (03-09-2010); Shoulder arthroscopy (04-10-2004); Lumbar microdiscectomy (04-06-1999); and Shoulder open rotator cuff repair (06/09/2011). Family His family history includes Cancer in his father and sister; Diabetes in his maternal grandmother; Heart disease in his father and mother; Hypertension in his sister; Stroke in his father. Social history  He reports that he quit smoking about 25 years ago. His smoking use included Cigarettes. He has never used smokeless tobacco. He reports that he does not drink alcohol or use drugs.  MEDICARE WELLNESS OBJECTIVES: Physical activity: Current Exercise Habits: The patient does not participate in regular exercise at present Cardiac risk factors: Cardiac Risk Factors include: advanced age (>4355men, 60>65 women);hypertension;dyslipidemia;male gender;sedentary lifestyle Depression/mood screen:   Depression screen [facility] 2/9 03/04/2016  Decreased Interest 0  Down, Depressed, Hopeless 0  PHQ - 2 Score 0    ADLs:  In your present state of health, do you have any difficulty performing the following activities: 03/04/2016 11/26/2015  Hearing? Y N  Vision? N N  Difficulty concentrating or making decisions? Y N  Walking or climbing stairs? N N  Dressing or bathing? N N  Doing errands, shopping? N N  Preparing Food and eating ? N -  Using the Toilet? N -  In the past six months, have you accidently leaked urine? N -  Do you have problems with loss of bowel control? N -  Managing your Medications? Y -  Managing your Finances? N -  Housekeeping or managing your Housekeeping? N -  Some recent data might be hidden     Cognitive Testing  Alert? Yes  Normal Appearance?Yes  Oriented to person? Yes  Place? Yes   Time? Yes  Recall of three objects?  Yes  Can perform simple calculations? Yes  Displays appropriate judgment?Yes  Can  read the correct time from a watch face?Yes  EOL planning: Does Patient Have a Medical Advance Directive?: Yes Type of Advance Directive: Healthcare Power of Attorney, Living will Does patient want to make changes to medical advance directive?: Yes (MAU/Ambulatory/Procedural Areas - Information given) Copy of Healthcare Power of Attorney in Chart?: Yes   Objective:   Today's Vitals   03/04/16 0929  BP: (!) 194/86  Pulse: (!) 56  Resp: 16  Temp: 97.8 F (36.6 C)  TempSrc: Temporal  Weight: 158 lb (71.7 kg)  Height: 5\' 9"  (1.753 m)   Body mass index is 23.33 kg/m.  General appearance: alert, no distress, WD/WN,  male HEENT: normocephalic, sclerae anicteric, TMs pearly, nares patent, no discharge or erythema, pharynx normal Oral cavity: MMM, no lesions Neck: supple, no lymphadenopathy, no thyromegaly, no masses Heart: RRR, normal S1, S2, no murmurs Lungs: CTA bilaterally, no wheezes, rhonchi, or rales Abdomen: +bs, soft, non tender, non distended, no masses, no hepatomegaly, no splenomegaly Musculoskeletal: nontender, no swelling, no obvious deformity Extremities: no edema, no cyanosis, no clubbing Pulses: 2+ symmetric, upper and lower extremities, normal cap refill Neurological: alert, oriented x 3, CN2-12 intact, strength normal upper extremities and lower extremities, sensation normal throughout, DTRs 2+ throughout, no cerebellar signs, gait normal Psychiatric: normal affect, behavior normal, pleasant   Medicare Attestation I have personally reviewed: The patient's medical and social history Their use of alcohol, tobacco or illicit drugs Their current medications and supplements The patient's functional ability including ADLs,fall risks, home safety risks, cognitive, and hearing and visual impairment Diet and physical activities Evidence for depression or mood disorders  The patient's weight, height, BMI, and visual acuity have been recorded in the chart.  I have made  referrals, counseling, and provided education to the patient based on review of the above and I have provided the patient with a written personalized care plan for preventive services.     Terri Piedra, PA-C   03/04/2016

## 2016-03-05 LAB — HEMOGLOBIN A1C
HEMOGLOBIN A1C: 6.9 % — AB (ref <5.7)
MEAN PLASMA GLUCOSE: 151 mg/dL

## 2016-03-15 ENCOUNTER — Other Ambulatory Visit: Payer: Self-pay | Admitting: Physician Assistant

## 2016-03-20 ENCOUNTER — Other Ambulatory Visit: Payer: Self-pay | Admitting: Internal Medicine

## 2016-04-14 DIAGNOSIS — M9904 Segmental and somatic dysfunction of sacral region: Secondary | ICD-10-CM | POA: Diagnosis not present

## 2016-04-14 DIAGNOSIS — M9903 Segmental and somatic dysfunction of lumbar region: Secondary | ICD-10-CM | POA: Diagnosis not present

## 2016-04-14 DIAGNOSIS — M5136 Other intervertebral disc degeneration, lumbar region: Secondary | ICD-10-CM | POA: Diagnosis not present

## 2016-04-14 DIAGNOSIS — M9905 Segmental and somatic dysfunction of pelvic region: Secondary | ICD-10-CM | POA: Diagnosis not present

## 2016-04-15 ENCOUNTER — Other Ambulatory Visit: Payer: Self-pay | Admitting: Internal Medicine

## 2016-04-15 DIAGNOSIS — N3281 Overactive bladder: Secondary | ICD-10-CM

## 2016-04-16 DIAGNOSIS — M9905 Segmental and somatic dysfunction of pelvic region: Secondary | ICD-10-CM | POA: Diagnosis not present

## 2016-04-16 DIAGNOSIS — M9903 Segmental and somatic dysfunction of lumbar region: Secondary | ICD-10-CM | POA: Diagnosis not present

## 2016-04-16 DIAGNOSIS — M9904 Segmental and somatic dysfunction of sacral region: Secondary | ICD-10-CM | POA: Diagnosis not present

## 2016-04-16 DIAGNOSIS — M5136 Other intervertebral disc degeneration, lumbar region: Secondary | ICD-10-CM | POA: Diagnosis not present

## 2016-04-19 DIAGNOSIS — M9904 Segmental and somatic dysfunction of sacral region: Secondary | ICD-10-CM | POA: Diagnosis not present

## 2016-04-19 DIAGNOSIS — M9903 Segmental and somatic dysfunction of lumbar region: Secondary | ICD-10-CM | POA: Diagnosis not present

## 2016-04-19 DIAGNOSIS — M5136 Other intervertebral disc degeneration, lumbar region: Secondary | ICD-10-CM | POA: Diagnosis not present

## 2016-04-19 DIAGNOSIS — M9905 Segmental and somatic dysfunction of pelvic region: Secondary | ICD-10-CM | POA: Diagnosis not present

## 2016-04-28 DIAGNOSIS — M9905 Segmental and somatic dysfunction of pelvic region: Secondary | ICD-10-CM | POA: Diagnosis not present

## 2016-04-28 DIAGNOSIS — M9904 Segmental and somatic dysfunction of sacral region: Secondary | ICD-10-CM | POA: Diagnosis not present

## 2016-04-28 DIAGNOSIS — M9903 Segmental and somatic dysfunction of lumbar region: Secondary | ICD-10-CM | POA: Diagnosis not present

## 2016-04-28 DIAGNOSIS — M5136 Other intervertebral disc degeneration, lumbar region: Secondary | ICD-10-CM | POA: Diagnosis not present

## 2016-05-11 ENCOUNTER — Encounter: Payer: Self-pay | Admitting: Physician Assistant

## 2016-05-11 ENCOUNTER — Ambulatory Visit (INDEPENDENT_AMBULATORY_CARE_PROVIDER_SITE_OTHER): Payer: Medicare Other | Admitting: Physician Assistant

## 2016-05-11 VITALS — BP 164/72 | HR 56 | Temp 98.0°F | Resp 16 | Ht 69.0 in | Wt 164.0 lb

## 2016-05-11 DIAGNOSIS — M5442 Lumbago with sciatica, left side: Secondary | ICD-10-CM | POA: Diagnosis not present

## 2016-05-11 NOTE — Progress Notes (Signed)
Subjective:    Patient ID: Gerald [name], male    DOB: [birth year], 82 y.o.   MRN: [id number]  HPI 82 y.o. WM presents with bilateral lower back pain.  Has had back pain x 3 months off and on, worse after fall 3-4 weeks ago, he had mechanical fall in the yard and has been having more pain since that time. Bilateral lower buttocks/back pain, with radiation down lateral left leg occ into his calf, occ left leg weakness, no numbness/tingling, no incontinence, no urinary issues, no fever, no chills.   Had lower back surgery in 2001 L4-L5. He is on celebrex and baclofen.   BP has been normal at home.   Blood pressure (!) 164/72, pulse (!) 56, temperature 98 F (36.7 C), temperature source Temporal, resp. rate 16, height 5\' 9"  (1.753 m), weight 164 lb (74.4 kg).  Medications Current Outpatient Prescriptions on File Prior to Visit  Medication Sig  . aspirin 81 MG tablet Take by mouth daily.   Marland Kitchen atenolol (TENORMIN) 50 MG tablet TAKE 2 TABLETS BY MOUTH EVERY DAY  . baclofen (LIORESAL) 10 MG tablet TAKE 1/2 TO 1 TABLET BY MOUTH 2 TO 3 TIMES A DAY AS NEEDED FOR MUSCLE SPASMS  . celecoxib (CELEBREX) 200 MG capsule TAKE 1 CAPSULE BY MOUTH DAILY FOR PAIN AND INFLAMMATION  . Cholecalciferol (VITAMIN D PO) Take 5,000 Units by mouth daily.  Marland Kitchen ezetimibe (ZETIA) 10 MG tablet TAKE 1 TABLET BY MOUTH DAILY  . finasteride (PROSCAR) 5 MG tablet TAKE 1 TABLET BY MOUTH DAILY  . finasteride (PROSCAR) 5 MG tablet TAKE 1 TABLET BY MOUTH DAILY  . fish oil-omega-3 fatty acids 1000 MG capsule Take 1 g by mouth daily.   . Insulin Pen Needle (BD PEN NEEDLE NANO U/F) 32G X 4 MM MISC Test sugars 2 times daily  Dx: E11.9  . LANTUS SOLOSTAR 100 UNIT/ML Solostar Pen USE AS DIRECTED  . levothyroxine (SYNTHROID, LEVOTHROID) 150 MCG tablet TAKE 1 TABLET BY MOUTH EVERY MORNING  . losartan-hydrochlorothiazide (HYZAAR) 100-25 MG tablet TAKE 1 TABLET BY MOUTH EVERY DAY FOR HIGH BLOOD PRESSURE  . metFORMIN (GLUCOPHAGE-XR) 500  MG 24 hr tablet TAKE 4 TABLETS BY MOUTH EVERY DAY FOR DIABETES  . minocycline (MINOCIN,DYNACIN) 100 MG capsule TAKE 1 CAPSULE BY MOUTH DAILY  . Multiple Vitamin (MULTIVITAMIN) tablet Take 1 tablet by mouth daily.  Marland Kitchen MYRBETRIQ 50 MG TB24 tablet TAKE 1 TABLET BY MOUTH DAILY FOR BLADDER  . pantoprazole (PROTONIX) 40 MG tablet TAKE 1 TABLET BY MOUTH DAILY  . simvastatin (ZOCOR) 40 MG tablet TAKE 1 TABLET BY MOUTH EVERY NIGHT AT BEDTIME FOR CHOLESTEROL  . traMADol (ULTRAM) 50 MG tablet Take 1 tablet (50 mg total) by mouth every 6 (six) hours as needed for moderate pain.   No current facility-administered medications on file prior to visit.     Problem list He has Essential hypertension; Mixed hyperlipidemia; CKD stage 2 due to type 2 diabetes mellitus (HCC); Vitamin D deficiency; Medication management; Hypothyroidism; GERD ; Insulin Dependent T2_IDDM; Benign prostatic hyperplasia; DJD (degenerative joint disease); Medicare annual wellness visit, subsequent; Complete rotator cuff rupture of left shoulder; and Thoracic aorta atherosclerosis (HCC) on his problem list.   Review of Systems  Constitutional: Negative.  Negative for fever.  HENT: Negative.   Respiratory: Negative.   Cardiovascular: Negative.  Negative for chest pain.  Gastrointestinal: Negative.  Negative for abdominal pain.  Genitourinary: Negative.  Negative for dysuria.  Musculoskeletal: Positive for back pain and gait problem.  Negative for arthralgias, joint swelling, myalgias, neck pain and neck stiffness.  Skin: Negative.   Neurological: Negative for weakness, numbness and headaches.       Objective:   Physical Exam  Constitutional: He is oriented to person, place, and time. He appears well-developed and well-nourished. No distress.  Neck: Normal range of motion. Neck supple.  Cardiovascular: Normal rate and regular rhythm.   Pulmonary/Chest: Effort normal and breath sounds normal.  Abdominal: Soft. Bowel sounds are  normal. There is no tenderness.  Musculoskeletal:  Patient is able to ambulate well. Gait is not  Antalgic. Straight leg raising with dorsiflexion negative bilaterally for radicular symptoms. Sensory exam in the legs are normal. Knee reflexes are normal Ankle reflexes are normal Strength is normal and symmetric in arms and legs. There is SI tenderness to palpation.  There is paraspinal muscle spasm.  There is not midline tenderness.  ROM of spine with  limited in flexion due to pain.   Lymphadenopathy:    He has no cervical adenopathy.  Neurological: He is alert and oriented to person, place, and time. No cranial nerve deficit.  Skin: Skin is warm and dry. No rash noted.      Assessment & Plan:  1. Acute bilateral low back pain with left-sided sciatica - negative straight leg Prednisone was not prescribed due to DM NSAIDs, RICE, and exercises given - Ambulatory referral to Orthopedic Surgery The patient was advised to call immediately if he has any concerning symptoms in the interval. The patient voices understanding of current treatment options and is in agreement with the current care plan.The patient knows to call the clinic with any problems, questions or concerns or go to the ER if any further progression of symptoms.

## 2016-05-11 NOTE — Patient Instructions (Signed)
Continue celebrex Continue baclofen once or twice daily Take tramadol as needed  Do the stretches shown in the office.  Follow up with ortho  Go to the ER if you have any new weakness in your legs, have trouble controlling your urine or bowels, or have worsening pain.    Sciatica Sciatica is pain, numbness, weakness, or tingling along your sciatic nerve. The sciatic nerve starts in the lower back and goes down the back of each leg. Sciatica happens when this nerve is pinched or has pressure put on it. Sciatica usually goes away on its own or with treatment. Sometimes, sciatica may keep coming back (recur). Follow these instructions at home: Medicines   Take over-the-counter and prescription medicines only as told by your doctor.  Do not drive or use heavy machinery while taking prescription pain medicine. Managing pain   If directed, put ice on the affected area.  Put ice in a plastic bag.  Place a towel between your skin and the bag.  Leave the ice on for 20 minutes, 2-3 times a day.  After icing, apply heat to the affected area before you exercise or as often as told by your doctor. Use the heat source that your doctor tells you to use, such as a moist heat pack or a heating pad.  Place a towel between your skin and the heat source.  Leave the heat on for 20-30 minutes.  Remove the heat if your skin turns bright red. This is especially important if you are unable to feel pain, heat, or cold. You may have a greater risk of getting burned. Activity   Return to your normal activities as told by your doctor. Ask your doctor what activities are safe for you.  Avoid activities that make your sciatica worse.  Take short rests during the day. Rest in a lying or standing position. This is usually better than sitting to rest.  When you rest for a long time, do some physical activity or stretching between periods of rest.  Avoid sitting for a long time without moving. Get up and  move around at least one time each hour.  Exercise and stretch regularly, as told by your doctor.  Do not lift anything that is heavier than 10 lb (4.5 kg) while you have symptoms of sciatica.  Avoid lifting heavy things even when you do not have symptoms.  Avoid lifting heavy things over and over.  When you lift objects, always lift in a way that is safe for your body. To do this, you should:  Bend your knees.  Keep the object close to your body.  Avoid twisting. General instructions   Use good posture.  Avoid leaning forward when you are sitting.  Avoid hunching over when you are standing.  Stay at a healthy weight.  Wear comfortable shoes that support your feet. Avoid wearing high heels.  Avoid sleeping on a mattress that is too soft or too hard. You might have less pain if you sleep on a mattress that is firm enough to support your back.  Keep all follow-up visits as told by your doctor. This is important. Contact a doctor if:  You have pain that:  Wakes you up when you are sleeping.  Gets worse when you lie down.  Is worse than the pain you have had in the past.  Lasts longer than 4 weeks.  You lose weight for without trying. Get help right away if:  You cannot control when you pee (  urinate) or poop (have a bowel movement).  You have weakness in any of these areas and it gets worse.  Lower back.  Lower belly (pelvis).  Butt (buttocks).  Legs.  You have redness or swelling of your back.  You have a burning feeling when you pee. This information is not intended to replace advice given to you by your health care provider. Make sure you discuss any questions you have with your health care provider. Document Released: 11/18/2007 Document Revised: 07/17/2015 Document Reviewed: 10/18/2014 Elsevier Interactive Patient Education  2017 [employer]   Back Exercises The following exercises strengthen the muscles that help to support the back. They also  help to keep the lower back flexible. Doing these exercises can help to prevent back pain or lessen existing pain. If you have back pain or discomfort, try doing these exercises 2-3 times each day or as told by your health care provider. When the pain goes away, do them once each day, but increase the number of times that you repeat the steps for each exercise (do more repetitions). If you do not have back pain or discomfort, do these exercises once each day or as told by your health care provider. Exercises Single Knee to Chest   Repeat these steps 3-5 times for each leg: 1. Lie on your back on a firm bed or the floor with your legs extended. 2. Bring one knee to your chest. Your other leg should stay extended and in contact with the floor. 3. Hold your knee in place by grabbing your knee or thigh. 4. Pull on your knee until you feel a gentle stretch in your lower back. 5. Hold the stretch for 10-30 seconds. 6. Slowly release and straighten your leg. Pelvic Tilt   Repeat these steps 5-10 times: 1. Lie on your back on a firm bed or the floor with your legs extended. 2. Bend your knees so they are pointing toward the ceiling and your feet are flat on the floor. 3. Tighten your lower abdominal muscles to press your lower back against the floor. This motion will tilt your pelvis so your tailbone points up toward the ceiling instead of pointing to your feet or the floor. 4. With gentle tension and even breathing, hold this position for 5-10 seconds. Cat-Cow   Repeat these steps until your lower back becomes more flexible: 1. Get into a hands-and-knees position on a firm surface. Keep your hands under your shoulders, and keep your knees under your hips. You may place padding under your knees for comfort. 2. Let your head hang down, and point your tailbone toward the floor so your lower back becomes rounded like the back of a cat. 3. Hold this position for 5 seconds. 4. Slowly lift your head and  point your tailbone up toward the ceiling so your back forms a sagging arch like the back of a cow. 5. Hold this position for 5 seconds. Press-Ups   Repeat these steps 5-10 times: 1. Lie on your abdomen (face-down) on the floor. 2. Place your palms near your head, about shoulder-width apart. 3. While you keep your back as relaxed as possible and keep your hips on the floor, slowly straighten your arms to raise the top half of your body and lift your shoulders. Do not use your back muscles to raise your upper torso. You may adjust the placement of your hands to make yourself more comfortable. 4. Hold this position for 5 seconds while you keep your back  relaxed. 5. Slowly return to lying flat on the floor. Bridges   Repeat these steps 10 times: 1. Lie on your back on a firm surface. 2. Bend your knees so they are pointing toward the ceiling and your feet are flat on the floor. 3. Tighten your buttocks muscles and lift your buttocks off of the floor until your waist is at almost the same height as your knees. You should feel the muscles working in your buttocks and the back of your thighs. If you do not feel these muscles, slide your feet 1-2 inches farther away from your buttocks. 4. Hold this position for 3-5 seconds. 5. Slowly lower your hips to the starting position, and allow your buttocks muscles to relax completely. If this exercise is too easy, try doing it with your arms crossed over your chest. Abdominal Crunches   Repeat these steps 5-10 times: 1. Lie on your back on a firm bed or the floor with your legs extended. 2. Bend your knees so they are pointing toward the ceiling and your feet are flat on the floor. 3. Cross your arms over your chest. 4. Tip your chin slightly toward your chest without bending your neck. 5. Tighten your abdominal muscles and slowly raise your trunk (torso) high enough to lift your shoulder blades a tiny bit off of the floor. Avoid raising your torso higher  than that, because it can put too much stress on your low back and it does not help to strengthen your abdominal muscles. 6. Slowly return to your starting position. Back Lifts  Repeat these steps 5-10 times: 1. Lie on your abdomen (face-down) with your arms at your sides, and rest your forehead on the floor. 2. Tighten the muscles in your legs and your buttocks. 3. Slowly lift your chest off of the floor while you keep your hips pressed to the floor. Keep the back of your head in line with the curve in your back. Your eyes should be looking at the floor. 4. Hold this position for 3-5 seconds. 5. Slowly return to your starting position. Contact a health care provider if:  Your back pain or discomfort gets much worse when you do an exercise.  Your back pain or discomfort does not lessen within 2 hours after you exercise. If you have any of these problems, stop doing these exercises right away. Do not do them again unless your health care provider says that you can. Get help right away if:  You develop sudden, severe back pain. If this happens, stop doing the exercises right away. Do not do them again unless your health care provider says that you can. This information is not intended to replace advice given to you by your health care provider. Make sure you discuss any questions you have with your health care provider. Document Released: 03/18/2004 Document Revised: 06/18/2015 Document Reviewed: 04/04/2014 Elsevier Interactive Patient Education  2017 [employer].

## 2016-05-24 DIAGNOSIS — M5442 Lumbago with sciatica, left side: Secondary | ICD-10-CM | POA: Diagnosis not present

## 2016-05-31 ENCOUNTER — Other Ambulatory Visit: Payer: Self-pay | Admitting: Internal Medicine

## 2016-06-03 DIAGNOSIS — M5416 Radiculopathy, lumbar region: Secondary | ICD-10-CM | POA: Diagnosis not present

## 2016-06-03 DIAGNOSIS — M4317 Spondylolisthesis, lumbosacral region: Secondary | ICD-10-CM | POA: Diagnosis not present

## 2016-06-03 DIAGNOSIS — M545 Low back pain: Secondary | ICD-10-CM | POA: Diagnosis not present

## 2016-06-03 DIAGNOSIS — M62830 Muscle spasm of back: Secondary | ICD-10-CM | POA: Diagnosis not present

## 2016-06-03 DIAGNOSIS — M5136 Other intervertebral disc degeneration, lumbar region: Secondary | ICD-10-CM | POA: Diagnosis not present

## 2016-06-03 DIAGNOSIS — M419 Scoliosis, unspecified: Secondary | ICD-10-CM | POA: Diagnosis not present

## 2016-06-08 DIAGNOSIS — M5136 Other intervertebral disc degeneration, lumbar region: Secondary | ICD-10-CM | POA: Diagnosis not present

## 2016-06-08 DIAGNOSIS — M4317 Spondylolisthesis, lumbosacral region: Secondary | ICD-10-CM | POA: Diagnosis not present

## 2016-06-08 DIAGNOSIS — M545 Low back pain: Secondary | ICD-10-CM | POA: Diagnosis not present

## 2016-06-08 DIAGNOSIS — M62830 Muscle spasm of back: Secondary | ICD-10-CM | POA: Diagnosis not present

## 2016-06-08 DIAGNOSIS — M5416 Radiculopathy, lumbar region: Secondary | ICD-10-CM | POA: Diagnosis not present

## 2016-06-08 DIAGNOSIS — M419 Scoliosis, unspecified: Secondary | ICD-10-CM | POA: Diagnosis not present

## 2016-06-11 ENCOUNTER — Encounter: Payer: Self-pay | Admitting: Internal Medicine

## 2016-06-11 ENCOUNTER — Ambulatory Visit (INDEPENDENT_AMBULATORY_CARE_PROVIDER_SITE_OTHER): Payer: Medicare Other | Admitting: Internal Medicine

## 2016-06-11 VITALS — BP 122/60 | HR 56 | Temp 97.3°F | Resp 16 | Ht 68.5 in | Wt 158.0 lb

## 2016-06-11 DIAGNOSIS — Z794 Long term (current) use of insulin: Secondary | ICD-10-CM | POA: Diagnosis not present

## 2016-06-11 DIAGNOSIS — R488 Other symbolic dysfunctions: Secondary | ICD-10-CM

## 2016-06-11 DIAGNOSIS — N182 Chronic kidney disease, stage 2 (mild): Secondary | ICD-10-CM

## 2016-06-11 DIAGNOSIS — E119 Type 2 diabetes mellitus without complications: Secondary | ICD-10-CM

## 2016-06-11 DIAGNOSIS — K219 Gastro-esophageal reflux disease without esophagitis: Secondary | ICD-10-CM

## 2016-06-11 DIAGNOSIS — R410 Disorientation, unspecified: Secondary | ICD-10-CM | POA: Diagnosis not present

## 2016-06-11 DIAGNOSIS — E782 Mixed hyperlipidemia: Secondary | ICD-10-CM

## 2016-06-11 DIAGNOSIS — E1122 Type 2 diabetes mellitus with diabetic chronic kidney disease: Secondary | ICD-10-CM

## 2016-06-11 DIAGNOSIS — I10 Essential (primary) hypertension: Secondary | ICD-10-CM | POA: Diagnosis not present

## 2016-06-11 DIAGNOSIS — Z79899 Other long term (current) drug therapy: Secondary | ICD-10-CM | POA: Diagnosis not present

## 2016-06-11 DIAGNOSIS — E039 Hypothyroidism, unspecified: Secondary | ICD-10-CM | POA: Diagnosis not present

## 2016-06-11 DIAGNOSIS — E559 Vitamin D deficiency, unspecified: Secondary | ICD-10-CM | POA: Diagnosis not present

## 2016-06-11 LAB — BASIC METABOLIC PANEL WITH GFR
BUN: 31 mg/dL — ABNORMAL HIGH (ref 7–25)
CALCIUM: 9.7 mg/dL (ref 8.6–10.3)
CO2: 28 mmol/L (ref 20–31)
CREATININE: 1.34 mg/dL — AB (ref 0.70–1.18)
Chloride: 101 mmol/L (ref 98–110)
GFR, EST AFRICAN AMERICAN: 58 mL/min — AB (ref >=60)
GFR, EST NON AFRICAN AMERICAN: 50 mL/min — AB (ref >=60)
Glucose, Bld: 177 mg/dL — ABNORMAL HIGH (ref 65–99)
Potassium: 4.4 mmol/L (ref 3.5–5.3)
SODIUM: 138 mmol/L (ref 135–146)

## 2016-06-11 LAB — CBC WITH DIFFERENTIAL/PLATELET
Basophils Absolute: 0 cells/uL (ref 0–200)
Basophils Relative: 0 %
EOS PCT: 3 %
Eosinophils Absolute: 216 cells/uL (ref 15–500)
HCT: 46.6 % (ref 38.5–50.0)
Hemoglobin: 16.1 g/dL (ref 13.2–17.1)
LYMPHS PCT: 33 %
Lymphs Abs: 2376 cells/uL (ref 850–3900)
MCH: 31.6 pg (ref 27.0–33.0)
MCHC: 34.5 g/dL (ref 32.0–36.0)
MCV: 91.4 fL (ref 80.0–100.0)
MONOS PCT: 8 %
MPV: 8.8 fL (ref 7.5–12.5)
Monocytes Absolute: 576 cells/uL (ref 200–950)
NEUTROS PCT: 56 %
Neutro Abs: 4032 cells/uL (ref 1500–7800)
Platelets: 155 10*3/uL (ref 140–400)
RBC: 5.1 MIL/uL (ref 4.20–5.80)
RDW: 13.9 % (ref 11.0–15.0)
WBC: 7.2 10*3/uL (ref 3.8–10.8)

## 2016-06-11 LAB — LIPID PANEL
CHOLESTEROL: 178 mg/dL (ref <200)
HDL: 43 mg/dL (ref >40)
LDL Cholesterol: 64 mg/dL (ref <100)
TRIGLYCERIDES: 357 mg/dL — AB (ref <150)
Total CHOL/HDL Ratio: 4.1 Ratio (ref <5.0)
VLDL: 71 mg/dL — ABNORMAL HIGH (ref <30)

## 2016-06-11 LAB — HEPATIC FUNCTION PANEL
ALT: 33 U/L (ref 9–46)
AST: 25 U/L (ref 10–35)
Albumin: 4.2 g/dL (ref 3.6–5.1)
Alkaline Phosphatase: 44 U/L (ref 40–115)
BILIRUBIN DIRECT: 0.1 mg/dL (ref <=0.2)
BILIRUBIN INDIRECT: 0.7 mg/dL (ref 0.2–1.2)
TOTAL PROTEIN: 6.3 g/dL (ref 6.1–8.1)
Total Bilirubin: 0.8 mg/dL (ref 0.2–1.2)

## 2016-06-11 NOTE — Progress Notes (Signed)
This very nice  81 MWM presents for 6 month follow up with Hypertension, Hyperlipidemia, Pre-Diabetes and Vitamin D Deficiency.      Today patient seems to behaving moderate difficulty with anomia and word search and in discussion today with wife apparently this has been progressive over the last 3-4 months.     Patient is treated for HTN (1989) & BP has been controlled at home. Today's BP is at goal - 122/60. Patient has had no complaints of any cardiac type chest pain, palpitations, dyspnea/orthopnea/PND, dizziness, claudication, or dependent edema.     Hyperlipidemia is controlled with diet & meds. Patient denies myalgias or other med SE's. Last Lipids were at goal albeit elevated Trig's: Lab Results  Component Value Date   CHOL 141 03/04/2016   HDL 45 03/04/2016   LDLCALC 60 03/04/2016   TRIG 180 (H) 03/04/2016   CHOLHDL 3.1 03/04/2016      Also, the patient has history of T2_NIDDM (1999)  and has had no symptoms of reactive hypoglycemia, diabetic polys, paresthesias or visual blurring.  He relate after recent steroid shot for hip/back pain that his CBG's were higher , but was 146 mg% this am. Last A1c was not at goal: Lab Results  Component Value Date   HGBA1C 6.9 (H) 03/04/2016      Patientg has been on Thyroid replacement since 1992. Further, the patient also has history of Vitamin D Deficiency and supplements vitamin D without any suspected side-effects. Last vitamin D was at goal: Lab Results  Component Value Date   VD25OH 49 11/26/2015   Current Outpatient Prescriptions on File Prior to Visit  Medication Sig  . aspirin 81 MG tablet Take by mouth daily.   Marland Kitchen atenolol  50 MG tablet TAKE 2 TAB EVERY DAY  . baclofen  10 MG tablet TAKE 1/2-1 TABBY MOUTH 2-3 x / DAY AS NEEDED   . celecoxib 200 MG capsule TAKE 1 CAP DAILY   . VITAMIN D  Take 5,000 Units by mouth daily.  Marland Kitchen ezetimibe  10 MG tablet TAKE 1 TABLET BY MOUTH DAILY  . finasteride  5 MG tablet TAKE 1 TABLET BY MOUTH  DAILY  . fish oil-omega-3 1000 MG  Take 1 g by mouth daily.   Marland Kitchen LANTUS SOLOSTAR  USE AS DIRECTED  . levothyroxine  150 MCG tablet TAKE 1 TABLET BY MOUTH EVERY MORNING  . losartan-hctz 100-25 MG tablet TAKE 1 TAB EVERY DAY   . metFORMIN -XR 500 MG  TAKE 4 TAB EVERY DAY FOR DIABETES  . minocycline  100 MG capsule TAKE 1 CAP DAILY  . Multiple Vitamin  Take 1 tab daily.  Marland Kitchen MYRBETRIQ 50 MG  TAKE 1 TAB DAILY FOR BLADDER  . pantoprazole  40 MG tablet TAKE 1 TAB DAILY  . simvastatin 40 MG tablet TAKE 1 TAB EVERY NIGHT   . traMADol  50 MG tablet Take 1 tab every 6 hours as needed for moderate pain.   Allergies  Allergen Reactions  . Ace Inhibitors    PMHx:   Past Medical History:  Diagnosis Date  . BPH (benign prostatic hypertrophy)   . GERD (gastroesophageal reflux disease)   . Rotator cuff tear, right RECURRENT  . Vitamin D deficiency    Immunization History  Administered Date(s) Administered  . DT 11/01/2014  . Influenza Whole 11/10/2012  . Influenza, High Dose Seasonal PF 12/03/2013, 11/01/2014, 10/16/2015  . Pneumococcal Conjugate-13 12/03/2013  . Pneumococcal Polysaccharide-23 07/27/2011  . Tdap 02/23/2004  .  Zoster 07/15/2005   Past Surgical History:  Procedure Laterality Date  . LUMBAR MICRODISCECTOMY  04-06-1999   L4 - 5  . RIGHT SHOULDER ARTHROSCOPY/ LABRAL & BICEPS TENDON DEBRIDEMENT/ DISTAL CLAVICLE DECOMPRESSION/ SAD/ MINI OPEN ROTATOR CUFF REPAIR  03-09-2010  . SHOULDER ARTHROSCOPY  04-10-2004   LEFT  . SHOULDER OPEN ROTATOR CUFF REPAIR  06/09/2011   Procedure: ROTATOR CUFF REPAIR SHOULDER OPEN;  Surgeon: Drucilla Schmidt, MD;  Location: [facility];  Service: Orthopedics;  Laterality: Right;  anterior acromionectomy repair right rotator cuff with tissue mend    FHx:    Reviewed / unchanged  SHx:    Reviewed / unchanged  Systems Review:  Constitutional: Denies fever, chills, wt changes, headaches, insomnia, fatigue, night sweats, change in  appetite. Eyes: Denies redness, blurred vision, diplopia, discharge, itchy, watery eyes.  ENT: Denies discharge, congestion, post nasal drip, epistaxis, sore throat, earache, hearing loss, dental pain, tinnitus, vertigo, sinus pain, snoring.  CV: Denies chest pain, palpitations, irregular heartbeat, syncope, dyspnea, diaphoresis, orthopnea, PND, claudication or edema. Respiratory: denies cough, dyspnea, DOE, pleurisy, hoarseness, laryngitis, wheezing.  Gastrointestinal: Denies dysphagia, odynophagia, heartburn, reflux, water brash, abdominal pain or cramps, nausea, vomiting, bloating, diarrhea, constipation, hematemesis, melena, hematochezia  or hemorrhoids. Genitourinary: Denies dysuria, frequency, urgency, nocturia, hesitancy, discharge, hematuria or flank pain. Musculoskeletal: Denies arthralgias, myalgias, stiffness, jt. swelling, pain, limping or strain/sprain.  Skin: Denies pruritus, rash, hives, warts, acne, eczema or change in skin lesion(s). Neuro: No weakness, tremor, incoordination, spasms, paresthesia or pain. Psychiatric: Denies confusion, memory loss or sensory loss. Endo: Denies change in weight, skin or hair change.  Heme/Lymph: No excessive bleeding, bruising or enlarged lymph nodes.  Physical Exam  BP 122/60   Pulse (!) 56   Temp 97.3 F (36.3 C)   Resp 16   Ht 5' 8.5" (1.74 m)   Wt 158 lb (71.7 kg)   BMI 23.67 kg/m   Appears well nourished, well groomed  and in no distress.  Eyes: PERRLA, EOMs, conjunctiva no swelling or erythema. Sinuses: No frontal/maxillary tenderness ENT/Mouth: EAC's clear, TM's nl w/o erythema, bulging. Nares clear w/o erythema, swelling, exudates. Oropharynx clear without erythema or exudates. Oral hygiene is good. Tongue normal, non obstructing. Hearing intact.  Neck: Supple. Thyroid nl. Car 2+/2+ without bruits, nodes or JVD. Chest: Respirations nl with BS clear & equal w/o rales, rhonchi, wheezing or stridor.  Cor: Heart sounds normal w/  regular rate and rhythm without sig. murmurs, gallops, clicks or rubs. Peripheral pulses normal and equal  without edema.  Abdomen: Soft & bowel sounds normal. Non-tender w/o guarding, rebound, hernias, masses or organomegaly.  Lymphatics: Unremarkable.  Musculoskeletal: Full ROM all peripheral extremities, joint stability, 5/5 strength and normal gait.  Skin: Warm, dry without exposed rashes, lesions or ecchymosis apparent.  Neuro: Cranial nerves intact, reflexes equal bilaterally. Sensory-motor testing grossly intact. Tendon reflexes grossly intact.  Pysch: Alert & oriented x 3.  Insight and judgement nl & appropriate. No ideations.  Assessment and Plan:  1. Essential hypertension  - Continue medication, monitor blood pressure at home.  - Continue DASH diet. Reminder to go to the ER if any CP,  SOB, nausea, dizziness, severe HA, changes vision/speech,  left arm numbness and tingling and jaw pain.  - CBC with Differential/Platelet - BASIC METABOLIC PANEL WITH GFR - Magnesium - TSH  2. Mixed hyperlipidemia  - Continue diet/meds, exercise,& lifestyle modifications.  - Continue monitor periodic cholesterol/liver & renal functions  - Hepatic function panel - Lipid  panel - TSH  3. T2_IDDM w/CKD2   (HCC)  - Continue diet, exercise, lifestyle modifications.  - Monitor appropriate labs.  - Hemoglobin A1c  4. Vitamin D deficiency  - Continue supplementation.  - VITAMIN D 25 Hydroxy   5. Insulin Dependent T2_IDDM  - Hemoglobin A1c  6. Gastroesophageal reflux disease   7. Hypothyroidism  - TSH  8. Medication management  - BASIC METABOLIC PANEL WITH GFR - Hepatic function panel - Magnesium - Lipid panel - TSH - Hemoglobin A1c - VITAMIN D 25 Hydroxy  9. Confusion  - MR Brain Wo Contrast; Future  10. Anomia  - MR Brain Wo Contrast; Future       Discussed  regular exercise, BP monitoring, weight control to achieve/maintain BMI less than 25 and discussed med  and SE's. Recommended labs to assess and monitor clinical status with further disposition pending results of labs. Over 30 minutes of exam, counseling, chart review was performed.

## 2016-06-11 NOTE — Patient Instructions (Signed)

## 2016-06-12 LAB — TSH: TSH: 2.63 mIU/L (ref 0.40–4.50)

## 2016-06-12 LAB — MAGNESIUM: MAGNESIUM: 2 mg/dL (ref 1.5–2.5)

## 2016-06-12 LAB — VITAMIN D 25 HYDROXY (VIT D DEFICIENCY, FRACTURES): Vit D, 25-Hydroxy: 60 ng/mL (ref 30–100)

## 2016-06-12 LAB — HEMOGLOBIN A1C
Hgb A1c MFr Bld: 7.8 % — ABNORMAL HIGH (ref <5.7)
MEAN PLASMA GLUCOSE: 177 mg/dL

## 2016-06-23 ENCOUNTER — Other Ambulatory Visit: Payer: Self-pay | Admitting: Internal Medicine

## 2016-06-23 ENCOUNTER — Ambulatory Visit
Admission: RE | Admit: 2016-06-23 | Discharge: 2016-06-23 | Disposition: A | Discharge status: Home / Self Care | Payer: Medicare Other | Source: Ambulatory Visit | Attending: Internal Medicine | Admitting: Internal Medicine

## 2016-06-23 DIAGNOSIS — F09 Unspecified mental disorder due to known physiological condition: Secondary | ICD-10-CM

## 2016-06-23 DIAGNOSIS — R410 Disorientation, unspecified: Secondary | ICD-10-CM | POA: Diagnosis not present

## 2016-06-23 DIAGNOSIS — R488 Other symbolic dysfunctions: Secondary | ICD-10-CM

## 2016-06-24 ENCOUNTER — Encounter: Payer: Self-pay | Admitting: Neurology

## 2016-06-30 DIAGNOSIS — M5136 Other intervertebral disc degeneration, lumbar region: Secondary | ICD-10-CM | POA: Diagnosis not present

## 2016-06-30 DIAGNOSIS — M419 Scoliosis, unspecified: Secondary | ICD-10-CM | POA: Diagnosis not present

## 2016-06-30 DIAGNOSIS — M4317 Spondylolisthesis, lumbosacral region: Secondary | ICD-10-CM | POA: Diagnosis not present

## 2016-06-30 DIAGNOSIS — M5416 Radiculopathy, lumbar region: Secondary | ICD-10-CM | POA: Diagnosis not present

## 2016-06-30 DIAGNOSIS — M545 Low back pain: Secondary | ICD-10-CM | POA: Diagnosis not present

## 2016-07-06 DIAGNOSIS — M4317 Spondylolisthesis, lumbosacral region: Secondary | ICD-10-CM | POA: Diagnosis not present

## 2016-07-06 DIAGNOSIS — M5136 Other intervertebral disc degeneration, lumbar region: Secondary | ICD-10-CM | POA: Diagnosis not present

## 2016-07-06 DIAGNOSIS — M545 Low back pain: Secondary | ICD-10-CM | POA: Diagnosis not present

## 2016-07-06 DIAGNOSIS — M419 Scoliosis, unspecified: Secondary | ICD-10-CM | POA: Diagnosis not present

## 2016-07-06 DIAGNOSIS — M5416 Radiculopathy, lumbar region: Secondary | ICD-10-CM | POA: Diagnosis not present

## 2016-07-07 DIAGNOSIS — H2513 Age-related nuclear cataract, bilateral: Secondary | ICD-10-CM | POA: Diagnosis not present

## 2016-07-07 DIAGNOSIS — E119 Type 2 diabetes mellitus without complications: Secondary | ICD-10-CM | POA: Diagnosis not present

## 2016-07-07 LAB — HM DIABETES EYE EXAM

## 2016-07-22 DIAGNOSIS — M4306 Spondylolysis, lumbar region: Secondary | ICD-10-CM | POA: Diagnosis not present

## 2016-07-22 DIAGNOSIS — M9983 Other biomechanical lesions of lumbar region: Secondary | ICD-10-CM | POA: Diagnosis not present

## 2016-07-22 DIAGNOSIS — M5136 Other intervertebral disc degeneration, lumbar region: Secondary | ICD-10-CM | POA: Diagnosis not present

## 2016-07-22 DIAGNOSIS — M5416 Radiculopathy, lumbar region: Secondary | ICD-10-CM | POA: Diagnosis not present

## 2016-07-24 ENCOUNTER — Other Ambulatory Visit: Payer: Self-pay | Admitting: Internal Medicine

## 2016-07-30 ENCOUNTER — Other Ambulatory Visit: Payer: Self-pay | Admitting: Physician Assistant

## 2016-07-30 DIAGNOSIS — N3281 Overactive bladder: Secondary | ICD-10-CM

## 2016-08-02 ENCOUNTER — Ambulatory Visit (INDEPENDENT_AMBULATORY_CARE_PROVIDER_SITE_OTHER): Payer: Medicare Other | Admitting: Neurology

## 2016-08-02 ENCOUNTER — Other Ambulatory Visit: Payer: Medicare Other

## 2016-08-02 ENCOUNTER — Encounter: Payer: Self-pay | Admitting: Neurology

## 2016-08-02 VITALS — BP 138/62 | HR 60 | Ht 69.5 in | Wt 157.0 lb

## 2016-08-02 DIAGNOSIS — F03A0 Unspecified dementia, mild, without behavioral disturbance, psychotic disturbance, mood disturbance, and anxiety: Secondary | ICD-10-CM

## 2016-08-02 DIAGNOSIS — R4789 Other speech disturbances: Secondary | ICD-10-CM | POA: Diagnosis not present

## 2016-08-02 DIAGNOSIS — F0390 Unspecified dementia without behavioral disturbance: Secondary | ICD-10-CM

## 2016-08-02 MED ORDER — DONEPEZIL HCL 10 MG PO TABS: ORAL_TABLET | ORAL | 6 refills | Status: DC

## 2016-08-02 NOTE — Patient Instructions (Signed)
1. Bloodwork for B12 level 2. Refer to speech therapy for word-finding difficulties 3. Start Aricept 10mg : Take 1/2 tablet daily for 1 month, then increase to 1 tablet daily 4. No further driving alone, no driving in poor driving conditions (after dark, rain) 5. Control of blood pressure, cholesterol, diabetes, as well as physical exercise and brain stimulation exercises are important for brain health 6. Follow-up in 6 months, call for any changes

## 2016-08-02 NOTE — Progress Notes (Signed)
NEUROLOGY CONSULTATION NOTE  Gerald [name] M Lizaola MRN: [id number] DOB: 18-Nov-1934  Referring provider: Dr. Lucky CowboyWilliam McKeown Primary care provider: Dr. Lucky CowboyWilliam McKeown  Reason for consult:  Memory loss, anomia  Dear Dr Oneta RackMcKeown:  Thank you for your kind referral of Gerald [name] M Icenhour for consultation of the above symptoms. Although his history is well known to you, please allow me to reiterate it for the purpose of our medical record. The patient was accompanied to the clinic by his wife who also provides collateral information. Records and images were personally reviewed where available.  HISTORY OF PRESENT ILLNESS: This is a 82 year old right-handed man with a history of hypertension, hyperlipidemia, diabetes, hypothyroidism, presenting for evaluation of memory loss with word-finding difficulties. He feels his memory is good except for "not knowing things that have happened." His wife started noticing memory changes for around 5 months, he would have difficulty trying to say a sentence, stopping and trying to grasp for the next word. Recently he forgets names of his grandchildren. He forgets where he puts things. He has not been driving as much after getting turned around 2 months ago with his wife in the car with him. 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 He fills out his own pillbox and states he takes his medications okay. He states he is here because he could not answer questions at Dr. Kathryne SharperMcKeown's office one time. His wife denies any personality changes, no hallucinations. He is independent with ADLs. His last fall was 3-4 months ago.  He has had anosmia for at least 60 years. He has chronic back pain. He denies any headaches, dizziness, diplopia, dysarthria, dysphagia, neck pain, focal numbness/tingling/weakness, bowel/bladder dysfunction. No tremors, no falls. He denies any significant head injuries, no  alcohol use. No family history of dementia.  I personally reviewed MRI brain without contrast done 06/23/2016 which did not show any acute changes. There was a small area of cortical encephalomalacia in the left anterior superior frontal gyrus and left middle temporal gyrus. There was moderate chronic microvascular disease, more pronounced on the left hemisphere. There was also scattered bilateral gyriform hemosiderin affecting mostly the anterior and superior frontal lobes, left occipital and lateral left temporal lobe, with diagnosis of superficial siderosis, sequelae of prior intracranial hemorrhage.  Laboratory Data: Lab Results  Component Value Date   TSH 2.63 06/11/2016    PAST MEDICAL HISTORY:  Active Ambulatory Problems    Diagnosis Date Noted  . Essential hypertension 02/05/2013  . Mixed hyperlipidemia 02/05/2013  . CKD stage 2 due to type 2 diabetes mellitus (HCC) 02/05/2013  . Vitamin D deficiency 02/05/2013  . Medication management 08/15/2013  . Hypothyroidism 08/15/2013  . GERD  11/01/2014  . Insulin Dependent T2_IDDM 11/01/2014  . Benign prostatic hyperplasia 11/01/2014  . DJD (degenerative joint disease) 11/01/2014  . Medicare annual wellness visit, subsequent 11/01/2014  . Complete rotator cuff rupture of left shoulder 11/29/2013  . Thoracic aorta atherosclerosis (HCC) 08/18/2015   Resolved Ambulatory Problems    Diagnosis Date Noted  . Triggering of digit 11/29/2013  . Spider bite 11/04/2015   Past Medical History:  Diagnosis Date  . BPH (benign prostatic hypertrophy)   . GERD (gastroesophageal reflux disease)   . Rotator cuff tear, right RECURRENT  . Vitamin D deficiency     PAST SURGICAL HISTORY: Past Surgical History:  Procedure Laterality Date  . LUMBAR MICRODISCECTOMY  04-06-1999  L4 - 5  . RIGHT SHOULDER ARTHROSCOPY/ LABRAL & BICEPS TENDON DEBRIDEMENT/ DISTAL CLAVICLE DECOMPRESSION/ SAD/ MINI OPEN ROTATOR CUFF REPAIR  03-09-2010  . SHOULDER  ARTHROSCOPY  04-10-2004   LEFT  . SHOULDER OPEN ROTATOR CUFF REPAIR  06/09/2011   Procedure: ROTATOR CUFF REPAIR SHOULDER OPEN;  Surgeon: Drucilla Schmidt, MD;  Location: [facility];  Service: Orthopedics;  Laterality: Right;  anterior acromionectomy repair right rotator cuff with tissue mend     MEDICATIONS: Current Outpatient Prescriptions on File Prior to Visit  Medication Sig Dispense Refill  . aspirin 81 MG tablet Take by mouth daily.     Marland Kitchen atenolol (TENORMIN) 50 MG tablet TAKE 2 TABLETS BY MOUTH EVERY DAY 180 tablet 1  . baclofen (LIORESAL) 10 MG tablet TAKE 1/2 TO 1 TABLET BY MOUTH 2 TO 3 TIMES A DAY AS NEEDED FOR MUSCLE SPASMS 270 tablet 1  . celecoxib (CELEBREX) 200 MG capsule TAKE 1 CAPSULE BY MOUTH DAILY FOR PAIN AND INFLAMMATION 90 capsule 1  . Cholecalciferol (VITAMIN D PO) Take 5,000 Units by mouth daily.    Marland Kitchen ezetimibe (ZETIA) 10 MG tablet TAKE 1 TABLET BY MOUTH DAILY 90 tablet 1  . finasteride (PROSCAR) 5 MG tablet TAKE 1 TABLET BY MOUTH DAILY 90 tablet 1  . fish oil-omega-3 fatty acids 1000 MG capsule Take 1 g by mouth daily.     . Insulin Pen Needle (BD PEN NEEDLE NANO U/F) 32G X 4 MM MISC Test sugars 2 times daily  Dx: E11.9 100 each 12  . LANTUS SOLOSTAR 100 UNIT/ML Solostar Pen USE AS DIRECTED 45 mL 5  . levothyroxine (SYNTHROID, LEVOTHROID) 150 MCG tablet TAKE 1 TABLET BY MOUTH EVERY MORNING 90 tablet 0  . losartan-hydrochlorothiazide (HYZAAR) 100-25 MG tablet TAKE 1 TABLET BY MOUTH EVERY DAY FOR HIGH BLOOD PRESSURE 90 tablet 0  . metFORMIN (GLUCOPHAGE-XR) 500 MG 24 hr tablet TAKE 4 TABLETS BY MOUTH EVERY DAY FOR DIABETES 360 tablet 3  . minocycline (MINOCIN,DYNACIN) 100 MG capsule TAKE 1 CAPSULE BY MOUTH DAILY 90 capsule 3  . Multiple Vitamin (MULTIVITAMIN) tablet Take 1 tablet by mouth daily.    Marland Kitchen MYRBETRIQ 50 MG TB24 tablet TAKE 1 TABLET BY MOUTH DAILY FOR BLADDER 90 tablet 1  . pantoprazole (PROTONIX) 40 MG tablet TAKE 1 TABLET BY MOUTH DAILY 90  tablet 0  . simvastatin (ZOCOR) 40 MG tablet TAKE 1 TABLET BY MOUTH EVERY NIGHT AT BEDTIME FOR CHOLESTEROL 90 tablet PRN  . traMADol (ULTRAM) 50 MG tablet Take 1 tablet (50 mg total) by mouth every 6 (six) hours as needed for moderate pain. 90 tablet 0   No current facility-administered medications on file prior to visit.     ALLERGIES: Allergies  Allergen Reactions  . Ace Inhibitors     FAMILY HISTORY: Family History  Problem Relation Age of Onset  . Heart disease Mother   . Heart disease Father   . Stroke Father   . Cancer Father   . Cancer Sister   . Diabetes Maternal Grandmother   . Hypertension Sister   . Colon cancer Neg Hx   . Pancreatic cancer Neg Hx   . Stomach cancer Neg Hx   . Rectal cancer Neg Hx     SOCIAL HISTORY: Social History   Social History  . Marital status: Married    Spouse name: N/A  . Number of children: N/A  . Years of education: N/A   Occupational History  . Not on file.  Social History Main Topics  . Smoking status: Former Smoker    Types: Cigarettes    Quit date: 06/03/1990  . Smokeless tobacco: Never Used  . Alcohol use No     Comment: OCCASIONAL , WINE-no longer drinks alcohol  . Drug use: No  . Sexual activity: No   Other Topics Concern  . Not on file   Social History Narrative  . No narrative on file    REVIEW OF SYSTEMS: Constitutional: No fevers, chills, or sweats, no generalized fatigue, change in appetite Eyes: No visual changes, double vision, eye pain Ear, nose and throat: No hearing loss, ear pain, nasal congestion, sore throat Cardiovascular: No chest pain, palpitations Respiratory:  No shortness of breath at rest or with exertion, wheezes GastrointestinaI: No nausea, vomiting, diarrhea, abdominal pain, fecal incontinence Genitourinary:  No dysuria, urinary retention or frequency Musculoskeletal:  No neck pain, +back pain Integumentary: No rash, pruritus, skin lesions Neurological: as above Psychiatric: No  depression, insomnia, anxiety Endocrine: No palpitations, fatigue, diaphoresis, mood swings, change in appetite, change in weight, increased thirst Hematologic/Lymphatic:  No anemia, purpura, petechiae. Allergic/Immunologic: no itchy/runny eyes, nasal congestion, recent allergic reactions, rashes  PHYSICAL EXAM: Vitals:   08/02/16 1349  BP: 138/62  Pulse: 60   General: No acute distress Head:  Normocephalic/atraumatic Eyes: Fundoscopic exam shows bilateral sharp discs, no vessel changes, exudates, or hemorrhages Neck: supple, no paraspinal tenderness, full range of motion Back: No paraspinal tenderness Heart: regular rate and rhythm Lungs: Clear to auscultation bilaterally. Vascular: No carotid bruits. Skin/Extremities: No rash, no edema Neurological Exam: Mental status: alert and oriented to person, place, and time, stated he was 7, then corrected to 7 (he is 50), no dysarthria or aphasia, Fund of knowledge is appropriate.  Recent and remote memory are impaired.  Attention and concentration are normal.    Able to name high frequency objects but had some difficulty naming lower frequency objects, slight difficulty with repetition. MMSE - Mini Mental State Exam 08/02/2016 03/04/2016  Orientation to time 4 5  Orientation to Place 5 5  Registration 2 3  Attention/ Calculation 5 5  Recall 0 0  Language- name 2 objects 2 2  Language- repeat 0 1  Language- follow 3 step command 3 3  Language- read & follow direction 1 1  Write a sentence 1 1  Copy design 1 1  Total score 24 27   Cranial nerves: CN I: not tested CN II: pupils equal, round and reactive to light, visual fields intact, fundi unremarkable. CN III, IV, VI:  full range of motion, no nystagmus, no ptosis CN V: facial sensation intact CN VII: upper and lower face symmetric CN VIII: hearing intact to finger rub CN IX, X: gag intact, uvula midline CN XI: sternocleidomastoid and trapezius muscles intact CN XII: tongue  midline Bulk & Tone: normal, no fasciculations. Motor: 5/5 throughout except for left shoulder limited abduction post rotator cuff surgeries, no pronator drift. Sensation: intact to light touch, cold, pin, vibration and joint position sense.  No extinction to double simultaneous stimulation.  Romberg test negative Deep Tendon Reflexes: +2 throughout, no ankle clonus Plantar responses: downgoing bilaterally Cerebellar: no incoordination on finger to nose, heel to shin. No dysdiadochokinesia Gait: narrow-based and steady, able to tandem walk adequately. Tremor: none  IMPRESSION: This is a pleasant 82 year old right-handed man with a history of  hypertension, hyperlipidemia, diabetes, hypothyroidism, presenting with worsening memory and word-finding difficulties. His neurological exam is largely non-focal, he does have  some word-finding difficulties noted in the office, MMSE 24/30, history and exam suggestive of mild dementia. MRI brain showed a small area of cortical encephalomalacia in the left anterior superior frontal gyrus and left middle temporal gyrus; moderate chronic microvascular disease, more pronounced on the left hemisphere, as well as scattered bilateral gyriform hemosiderin affecting mostly the anterior and superior frontal lobes, left occipital and lateral left temporal lobe, with diagnosis of superficial siderosis, sequelae of prior intracranial hemorrhage. He and his wife deny any prior history of intracranial hemorrhage or significant head trauma. Memory and speech difficulties likely of vascular etiology (vascular dementia), he will be referred for speech therapy. Start Aricept 5mg  daily for 1 month, then increase to 10mg  daily. Side effects and expectations from the medication were discussed. Check B12 level. We discussed driving, would recommend always driving with his wife, and if she starts noticing more concerning changes, stop driving at that point. We discussed the importance of  control of vascular risk factors, physical exercise, and brain stimulation exercises for brain health. He will follow-up in 6 months and knows to call for any changes.   Thank you for allowing me to participate in the care of this patient. Please do not hesitate to call for any questions or concerns.   Patrcia Dolly, M.D.  CC: Dr. Oneta Rack

## 2016-08-03 ENCOUNTER — Encounter: Payer: Self-pay | Admitting: Neurology

## 2016-08-03 ENCOUNTER — Telehealth: Payer: Self-pay

## 2016-08-03 DIAGNOSIS — F03A0 Unspecified dementia, mild, without behavioral disturbance, psychotic disturbance, mood disturbance, and anxiety: Secondary | ICD-10-CM | POA: Insufficient documentation

## 2016-08-03 DIAGNOSIS — R4789 Other speech disturbances: Secondary | ICD-10-CM | POA: Insufficient documentation

## 2016-08-03 DIAGNOSIS — F0390 Unspecified dementia without behavioral disturbance: Secondary | ICD-10-CM | POA: Insufficient documentation

## 2016-08-03 LAB — VITAMIN B12: Vitamin B-12: 620 pg/mL (ref 200–1100)

## 2016-08-03 NOTE — Telephone Encounter (Signed)
Spoke with pt.  Gave resent b12 results.  Pt appreciative

## 2016-08-05 ENCOUNTER — Encounter: Payer: Self-pay | Admitting: Internal Medicine

## 2016-08-11 ENCOUNTER — Other Ambulatory Visit: Payer: Self-pay | Admitting: Internal Medicine

## 2016-08-18 ENCOUNTER — Other Ambulatory Visit: Payer: Self-pay | Admitting: Internal Medicine

## 2016-08-23 DIAGNOSIS — M4306 Spondylolysis, lumbar region: Secondary | ICD-10-CM | POA: Diagnosis not present

## 2016-08-23 DIAGNOSIS — M5416 Radiculopathy, lumbar region: Secondary | ICD-10-CM | POA: Diagnosis not present

## 2016-08-23 DIAGNOSIS — M5136 Other intervertebral disc degeneration, lumbar region: Secondary | ICD-10-CM | POA: Diagnosis not present

## 2016-08-23 DIAGNOSIS — M9983 Other biomechanical lesions of lumbar region: Secondary | ICD-10-CM | POA: Diagnosis not present

## 2016-08-26 ENCOUNTER — Other Ambulatory Visit: Payer: Self-pay | Admitting: Internal Medicine

## 2016-08-28 ENCOUNTER — Other Ambulatory Visit: Payer: Self-pay | Admitting: Physician Assistant

## 2016-08-28 ENCOUNTER — Other Ambulatory Visit: Payer: Self-pay | Admitting: Internal Medicine

## 2016-09-07 IMAGING — DX DG RIBS W/ CHEST 3+V*L*
4 series · 4 of 4 positions shown · non-contrast
Comparison: 03/09/2010

CLINICAL DATA: Left-sided rib pain.  Fall playing basketball.

EXAM:
LEFT RIBS AND CHEST - 3+ VIEW

[chest pa]
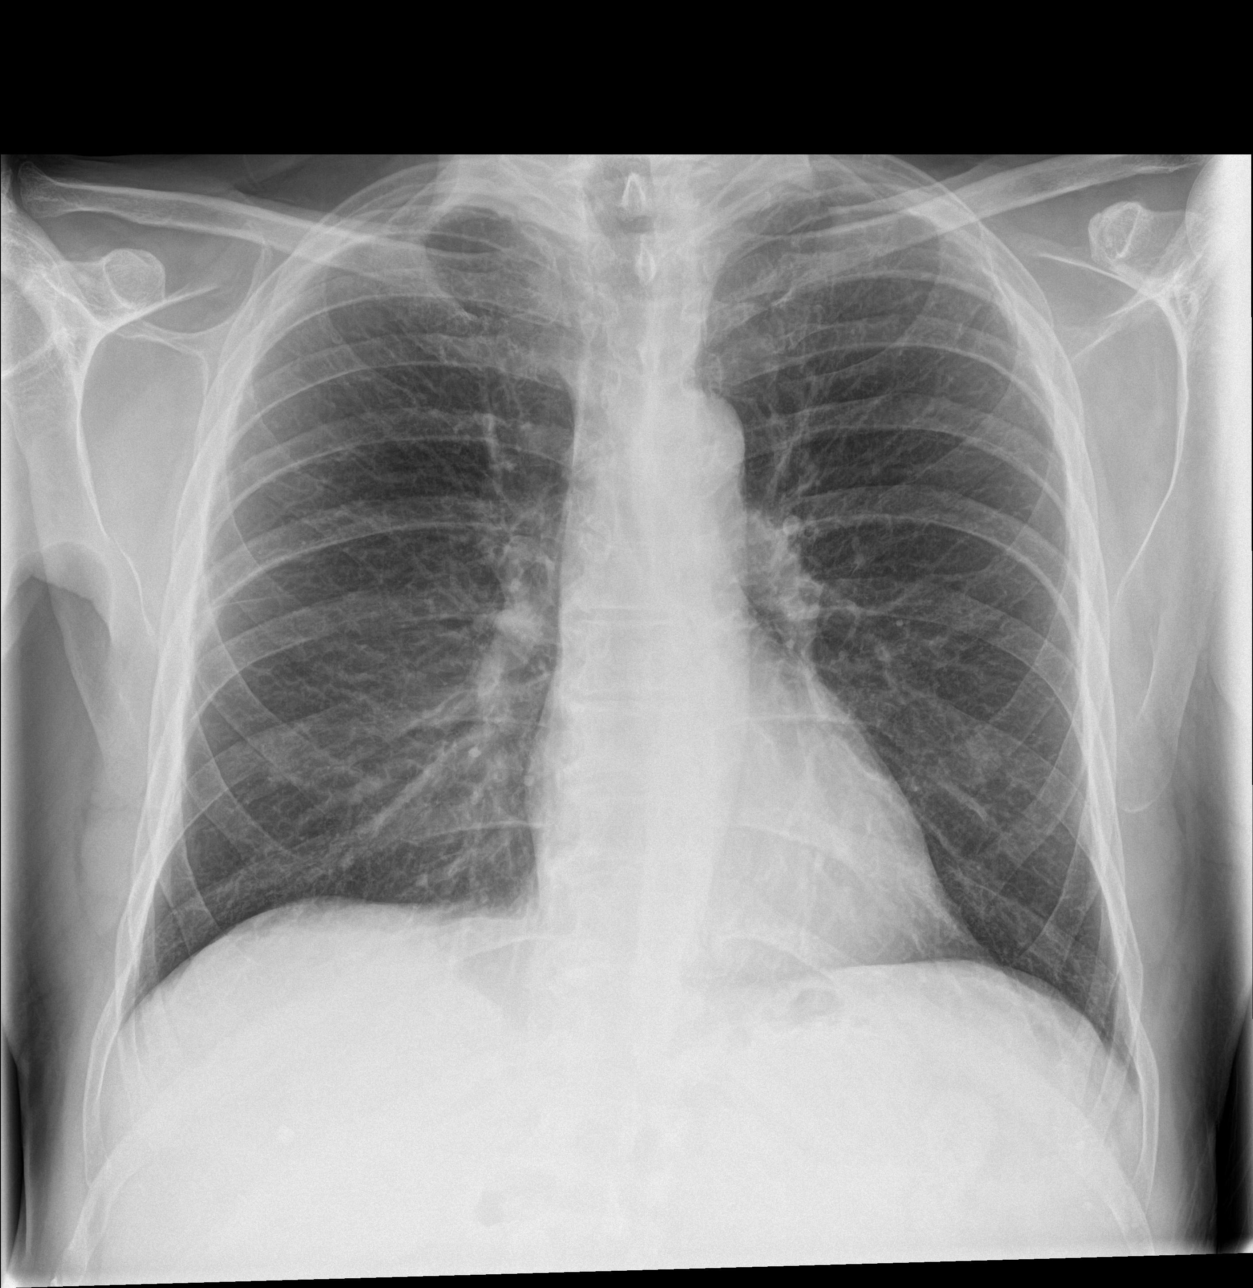

[rib pa obl]
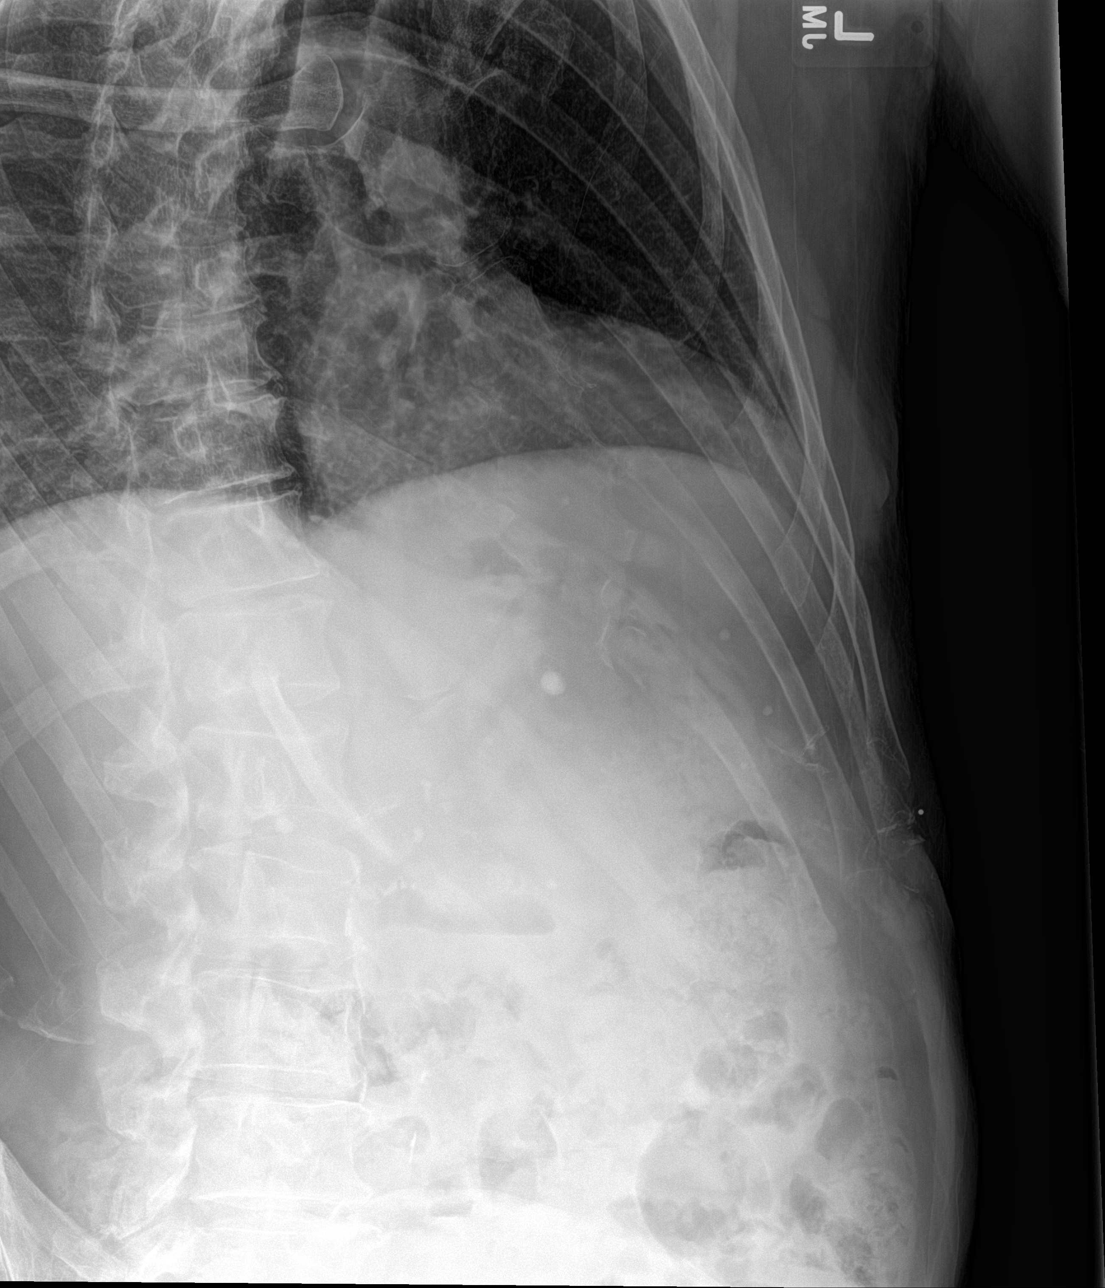

[rib pa (1 of 2)]
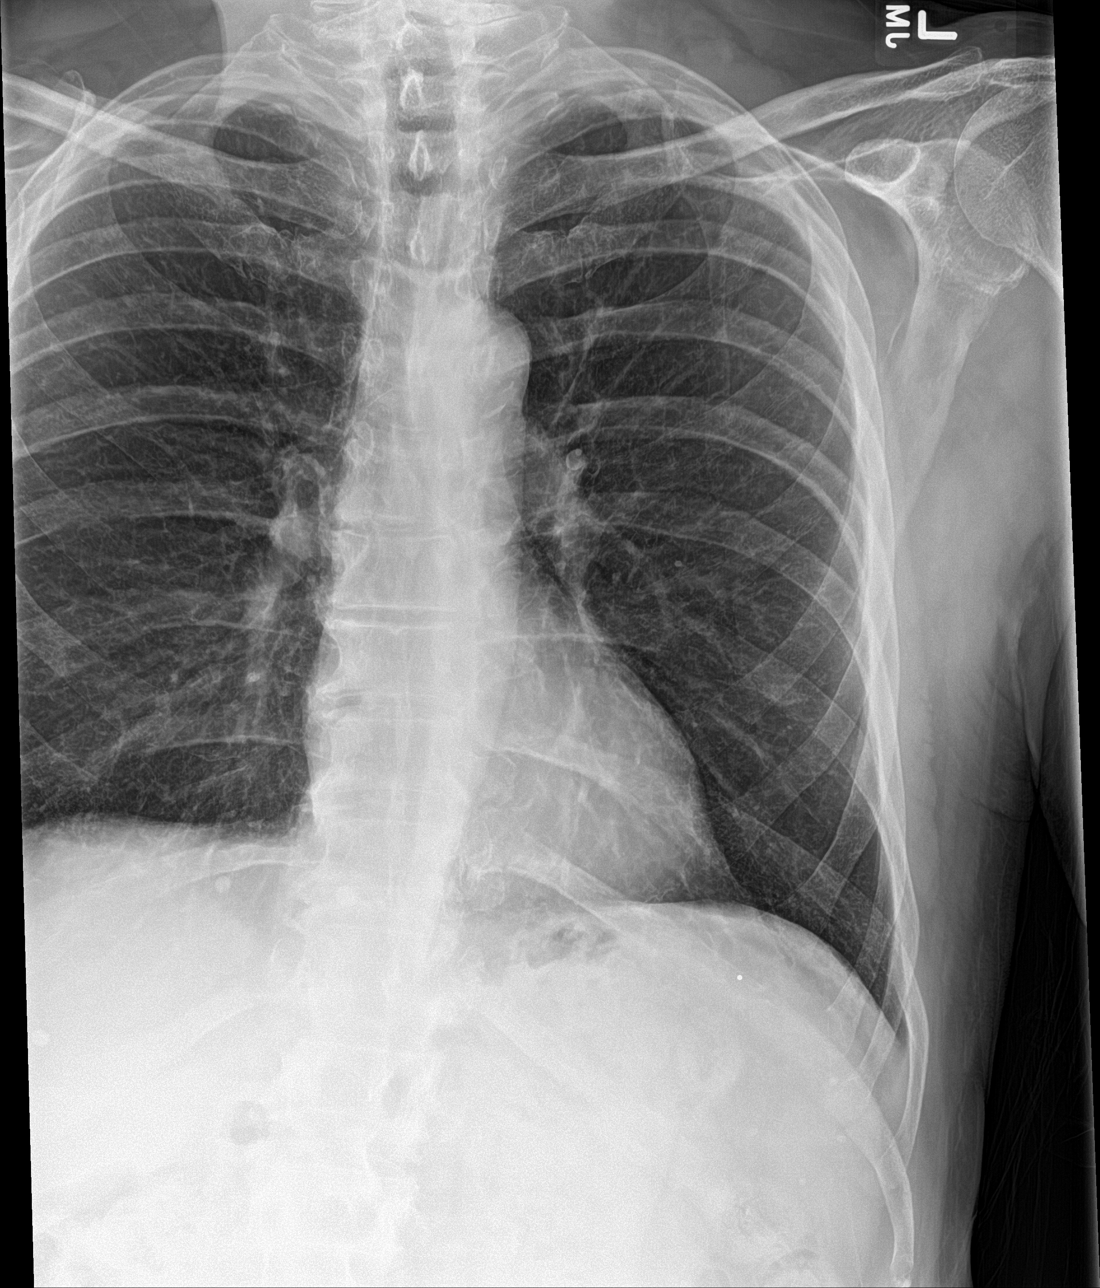

[rib pa (2 of 2)]
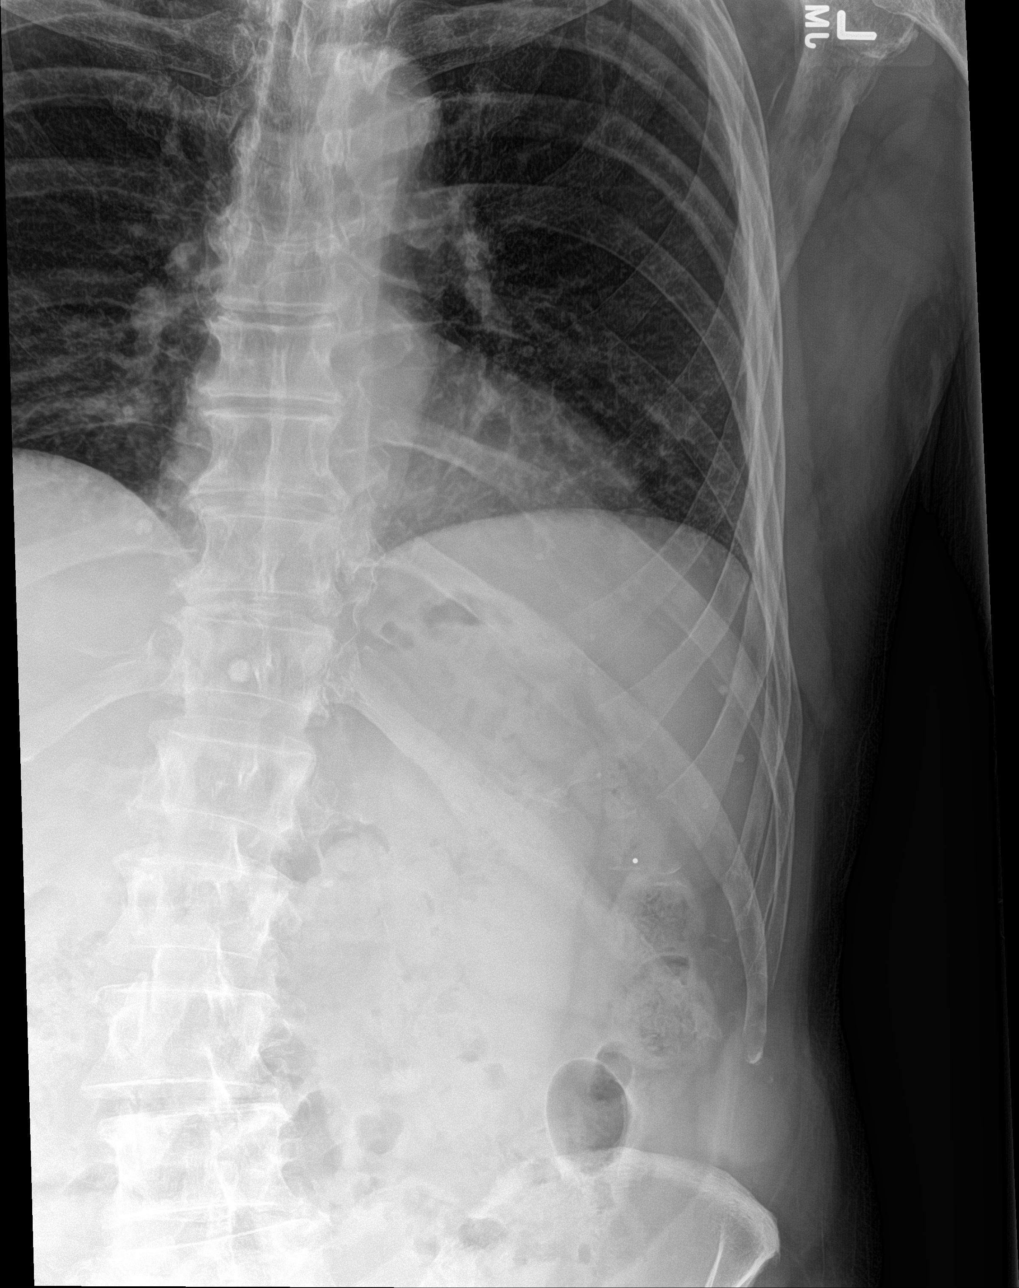

[4 of 4 positions shown; findings below may reference images not displayed]

FINDINGS: No visible rib fracture. Lungs are clear. No effusion or
pneumothorax. Heart is normal size.

Calcifications projecting over the left upper quadrant and midline
of the upper abdomen likely reflect granulomas in the liver and
spleen.
IMPRESSION: No visible rib fracture.

No active cardiopulmonary disease

## 2016-09-20 NOTE — Progress Notes (Signed)
Assessment and Plan:   Hypertension -Continue medication, monitor blood pressure at home. Continue DASH diet.  Reminder to go to the ER if any CP, SOB, nausea, dizziness, severe HA, changes vision/speech, left arm numbness and tingling and jaw pain.  Cholesterol -Continue diet and exercise. Check cholesterol.    Diabetes with diabetic chronic kidney disease -Continue diet and exercise. Check A1C next OV, checked in May - counseled about hypoglycemia, suggest decreasing insulin by 2-3 units  Vitamin D Def - check level and continue medications.   Thoracic aorta Atherosclerosis Control blood pressure, cholesterol, glucose, increase exercise.    Continue diet and meds as discussed. Further disposition pending results of labs. Discussed med's effects and SE's.   Over 30 minutes of exam, counseling, chart review, and critical decision making was performed Future Appointments Date Time Provider Department Center  12/24/2016 9:00 AM Lucky Cowboy, MD [facility] None  02/07/2017 3:00 PM Van Clines, MD [facility] None     HPI 82 y.o. male  presents for 3 month follow up on hypertension, cholesterol, diabetes and vitamin D deficiency.  He is seeing Dr. Mariel Craft for lower back pain, has had 1 epidural and will have another one.   His blood pressure has been controlled at home, today their BP is BP: 122/80  He does workout. He denies chest pain, shortness of breath, dizziness.  He is on cholesterol medication and denies myalgias. His cholesterol is at goal. The cholesterol last visit was:   Lab Results  Component Value Date   CHOL 178 06/11/2016   HDL 43 06/11/2016   LDLCALC 64 06/11/2016   TRIG 357 (H) 06/11/2016   CHOLHDL 4.1 06/11/2016   He has been working on diet and exercise for Diabetes with diabetic chronic kidney disease, he is on bASA, he is on ACE/ARB, he is insulin dependent on 30 units and 20units lantus, and denies paresthesia of the feet, polydipsia and visual  disturbances. Has had good glucose readings at home, has been higher since epidural. Last A1C was:  Lab Results  Component Value Date   HGBA1C 7.8 (H) 06/11/2016   Patient is on Vitamin D supplement.   Lab Results  Component Value Date   VD25OH 60 06/11/2016     He is on thyroid medication. His medication was not changed last visit.   Lab Results  Component Value Date   TSH 2.63 06/11/2016   BMI is Body mass index is 23.23 kg/m., he is working on diet and exercise. Wt Readings from Last 3 Encounters:  09/21/16 159 lb 9.6 oz (72.4 kg)  08/02/16 157 lb (71.2 kg)  06/11/16 158 lb (71.7 kg)     Current Medications:  Current Outpatient Prescriptions on File Prior to Visit  Medication Sig Dispense Refill  . aspirin 81 MG tablet Take by mouth daily.     Marland Kitchen atenolol (TENORMIN) 50 MG tablet TAKE 2 TABLETS BY MOUTH EVERY DAY 180 tablet 1  . baclofen (LIORESAL) 10 MG tablet TAKE 1/2 TO 1 TABLET BY MOUTH 2 TO 3 TIMES A DAY AS NEEDED FOR MUSCLE SPASMS 270 tablet 1  . BD PEN NEEDLE NANO U/F 32G X 4 MM MISC USE TO TEST SUGARS TWICE DAILY 100 each 0  . celecoxib (CELEBREX) 200 MG capsule TAKE 1 CAPSULE BY MOUTH DAILY FOR PAIN AND INFLAMMATION 90 capsule 0  . Cholecalciferol (VITAMIN D PO) Take 5,000 Units by mouth daily.    Marland Kitchen donepezil (ARICEPT) 10 MG tablet Take 1/2 tablet once daily for one  month, Then take 1 tablets once daily. 30 tablet 6  . ezetimibe (ZETIA) 10 MG tablet TAKE 1 TABLET BY MOUTH DAILY 90 tablet 1  . finasteride (PROSCAR) 5 MG tablet TAKE 1 TABLET BY MOUTH DAILY 90 tablet 1  . fish oil-omega-3 fatty acids 1000 MG capsule Take 1 g by mouth daily.     Marland Kitchen. LANTUS SOLOSTAR 100 UNIT/ML Solostar Pen USE AS DIRECTED 45 mL 5  . levothyroxine (SYNTHROID, LEVOTHROID) 150 MCG tablet TAKE 1 TABLET BY MOUTH EVERY MORNING 90 tablet 1  . losartan-hydrochlorothiazide (HYZAAR) 100-25 MG tablet TAKE 1 TABLET BY MOUTH EVERY DAY FOR HIGH BLOOD PRESSURE 90 tablet 1  . metFORMIN (GLUCOPHAGE-XR) 500  MG 24 hr tablet TAKE 4 TABLETS BY MOUTH EVERY DAY FOR DIABETES 360 tablet 3  . minocycline (MINOCIN,DYNACIN) 100 MG capsule TAKE 1 CAPSULE BY MOUTH DAILY 90 capsule 3  . Multiple Vitamin (MULTIVITAMIN) tablet Take 1 tablet by mouth daily.    Marland Kitchen. MYRBETRIQ 50 MG TB24 tablet TAKE 1 TABLET BY MOUTH DAILY FOR BLADDER 90 tablet 1  . pantoprazole (PROTONIX) 40 MG tablet TAKE 1 TABLET BY MOUTH DAILY 90 tablet 0  . simvastatin (ZOCOR) 40 MG tablet TAKE 1 TABLET BY MOUTH EVERY NIGHT AT BEDTIME FOR CHOLESTEROL 90 tablet PRN  . traMADol (ULTRAM) 50 MG tablet Take 1 tablet (50 mg total) by mouth every 6 (six) hours as needed for moderate pain. 90 tablet 0   No current facility-administered medications on file prior to visit.    Medical History:  Past Medical History:  Diagnosis Date  . BPH (benign prostatic hypertrophy)   . Diabetes mellitus without complication (HCC)   . GERD (gastroesophageal reflux disease)   . Hyperlipidemia   . Hypertension   . Rotator cuff tear, right RECURRENT  . Vitamin D deficiency    Allergies:  Allergies  Allergen Reactions  . Ace Inhibitors      Review of Systems:  Review of Systems  Constitutional: Negative.   HENT: Negative.   Eyes: Negative.   Respiratory: Negative.   Cardiovascular: Negative.   Gastrointestinal: Negative.   Genitourinary: Negative.   Musculoskeletal: Positive for back pain. Negative for falls, joint pain, myalgias and neck pain.  Skin: Negative.   Neurological: Negative.   Endo/Heme/Allergies: Negative.   Psychiatric/Behavioral: Positive for memory loss. Negative for depression, hallucinations, substance abuse and suicidal ideas. The patient is not nervous/anxious and does not have insomnia.     Family history- Review and unchanged Social history- Review and unchanged Physical Exam: BP 122/80   Pulse 69   Temp 97.9 F (36.6 C)   Resp 14   Ht 5' 9.5" (1.765 m)   Wt 159 lb 9.6 oz (72.4 kg)   SpO2 98%   BMI 23.23 kg/m  Wt  Readings from Last 3 Encounters:  09/21/16 159 lb 9.6 oz (72.4 kg)  08/02/16 157 lb (71.2 kg)  06/11/16 158 lb (71.7 kg)   General Appearance: Well nourished, in no apparent distress. Eyes: PERRLA, EOMs, conjunctiva no swelling or erythema Sinuses: No Frontal/maxillary tenderness ENT/Mouth: Ext aud canals clear, TMs without erythema, bulging. No erythema, swelling, or exudate on post pharynx.  Tonsils not swollen or erythematous. Hearing normal.  Neck: Supple, thyroid normal.  Respiratory: Respiratory effort normal, BS equal bilaterally without rales, rhonchi, wheezing or stridor.  Cardio: RRR with no MRGs. Brisk peripheral pulses without edema.  Abdomen: Soft, + BS.  Non tender, no guarding, rebound, hernias, masses. Lymphatics: Non tender without lymphadenopathy.  Musculoskeletal:  Full ROM, 5/5 strength, Normal gait Skin: Warm, dry without rashes, lesions, ecchymosis.  Neuro: Cranial nerves intact. No cerebellar symptoms.  Psych: Awake and oriented X 3, normal affect, Insight and Judgment appropriate.    Quentin MullingAmanda Lakyn Mantione, PA-C 12:25 PM [facility]

## 2016-09-21 ENCOUNTER — Ambulatory Visit (INDEPENDENT_AMBULATORY_CARE_PROVIDER_SITE_OTHER): Payer: Medicare Other | Admitting: Physician Assistant

## 2016-09-21 ENCOUNTER — Encounter: Payer: Self-pay | Admitting: Physician Assistant

## 2016-09-21 VITALS — BP 122/80 | HR 69 | Temp 97.9°F | Resp 14 | Ht 69.5 in | Wt 159.6 lb

## 2016-09-21 DIAGNOSIS — E119 Type 2 diabetes mellitus without complications: Secondary | ICD-10-CM

## 2016-09-21 DIAGNOSIS — I700 Atherosclerosis of aorta: Secondary | ICD-10-CM

## 2016-09-21 DIAGNOSIS — Z79899 Other long term (current) drug therapy: Secondary | ICD-10-CM

## 2016-09-21 DIAGNOSIS — I10 Essential (primary) hypertension: Secondary | ICD-10-CM

## 2016-09-21 DIAGNOSIS — E782 Mixed hyperlipidemia: Secondary | ICD-10-CM | POA: Diagnosis not present

## 2016-09-21 DIAGNOSIS — E1122 Type 2 diabetes mellitus with diabetic chronic kidney disease: Secondary | ICD-10-CM | POA: Diagnosis not present

## 2016-09-21 DIAGNOSIS — Z794 Long term (current) use of insulin: Secondary | ICD-10-CM | POA: Diagnosis not present

## 2016-09-21 DIAGNOSIS — N182 Chronic kidney disease, stage 2 (mild): Secondary | ICD-10-CM | POA: Diagnosis not present

## 2016-09-21 LAB — CBC WITH DIFFERENTIAL/PLATELET
BASOS ABS: 75 {cells}/uL (ref 0–200)
BASOS PCT: 1 %
EOS PCT: 2 %
Eosinophils Absolute: 150 cells/uL (ref 15–500)
HCT: 44.5 % (ref 38.5–50.0)
HEMOGLOBIN: 15.2 g/dL (ref 13.2–17.1)
Lymphocytes Relative: 24 %
Lymphs Abs: 1800 cells/uL (ref 850–3900)
MCH: 31.7 pg (ref 27.0–33.0)
MCHC: 34.2 g/dL (ref 32.0–36.0)
MCV: 92.9 fL (ref 80.0–100.0)
MONOS PCT: 9 %
MPV: 9.1 fL (ref 7.5–12.5)
Monocytes Absolute: 675 cells/uL (ref 200–950)
NEUTROS ABS: 4800 {cells}/uL (ref 1500–7800)
Neutrophils Relative %: 64 %
PLATELETS: 172 10*3/uL (ref 140–400)
RBC: 4.79 MIL/uL (ref 4.20–5.80)
RDW: 13.5 % (ref 11.0–15.0)
WBC: 7.5 10*3/uL (ref 3.8–10.8)

## 2016-09-21 LAB — TSH: TSH: 2.28 m[IU]/L (ref 0.40–4.50)

## 2016-09-21 NOTE — Patient Instructions (Addendum)
Vascular Dementia Dementia is a condition in which a person has problems with thinking, memory, and behavior that are severe enough to interfere with daily life. Vascular dementia is a type of dementia. It results from brain damage that is caused by the brain not getting enough blood. Vascular dementia usually begins between 60 and 80 years of age. What are the causes? Vascular dementia is caused by conditions that lessen blood flow to the brain. Common causes include:  Multiple small strokes. These may happen without symptoms (silent stroke).  Major stroke.  Damage to small blood vessels in the brain (cerebral small vessel disease).  What increases the risk?  Advancing age.  Having had a stroke.  Having high blood pressure (hypertension) or high cholesterol.  Having a disease that affects the heart or blood vessels.  Smoking.  Having diabetes.  Being male.  Being obese.  Not being active.  Having depression. What are the signs or symptoms? Symptoms can vary a lot from one person to another. Symptoms may be mild or severe depending on the amount of damage and which parts of the brain have been affected. Symptoms may begin suddenly or may develop gradually. Symptoms may remain stable, or they may get worse over time. Symptoms of vascular dementia may be similar to those of Alzheimer disease. The two conditions can occur together (mixed dementia). Symptoms of vascular dementia may include: Mental  Confusion.  Memory problems.  Poor attention and concentration.  Trouble understanding speech.  Depression.  Personality changes.  Trouble recognizing familiar people.  Agitation or aggression.  Paranoia.  Delusions or hallucinations. Physical  Weakness.  Poor balance.  Loss of bladder or bowel control (incontinence).  Unsteady walking (gait).  Speaking problems. Behavioral  Getting lost in familiar places.  Problems with planning and  judgment.  Trouble following instructions.  Social problems.  Emotional outbursts.  Trouble with daily activities and self-care.  Problems handling money. How is this diagnosed? There is not a specific test to diagnose vascular dementia. The health care provider will consider the person's medical history and symptoms or changes that are reported by friends and family. The health care provider will do a physical exam and may order lab tests or other tests that check brain and nervous system function. Tests that may be done include:  Blood tests.  Brain imaging tests.  Tests of movement, speech, and other daily activities (neurological exam).  Tests of memory, thinking, and problem-solving (neuropsychological or neurocognitive testing).  Diagnosis may involve several specialists. These may include a health care provider who specializes in the brain and nervous system (neurologist), a provider who specializes in disorders of the mind (psychiatrist), and a provider who focuses on speech and language changes (speech pathologist). How is this treated? There is no cure for vascular dementia. Brain damage that has already occurred cannot be reversed. Treatment depends on:  How severe the condition is.  Which parts of the brain have been affected.  The person's overall health.  Treatment measures aim to:  Treat the underlying cause of vascular dementia and manage risk factors. This may include: ? Controlling blood pressure. ? Lowering cholesterol. ? Treating diabetes. ? Quitting smoking. ? Losing weight.  Manage symptoms.  Prevent further brain damage.  Improve the person's health and quality of life.  Treatment for dementia may involve a team of health care providers, including:  A neurologist.  A psychiatrist.  An occupational therapist.  A speech pathologist.  A cardiologist.  An exercise physiologist or physical   therapist.  Follow these instructions at  home: Home care for a person with vascular dementia depends on what caused the condition and how severe the symptoms are. General guidelines for care at home include:  Following the health care provider's instructions for treating the condition that caused the dementia.  Using medicines only as told by the person's health care provider.  Creating a safe living space.  Learning ways to help the person remember people, appointments, and daily activities.  Finding a support group to help caregivers and family to cope with the effects of dementia.  Helping family and friends learn about ways to communicate with a person who has dementia.  Making sure the person keeps all follow-up visits and goes to all rehabilitation appointments as told by the health care team. This is important.  Contact a health care provider if:  A fever develops.  New behavioral problems develop.  Problems with swallowing develop.  Confusion gets worse.  Sleepiness gets worse. Get help right away if:  Loss of consciousness occurs.  There is a sudden loss of speech, balance, or thinking ability.  New numbness or paralysis occurs.  Sudden, severe headache occurs.  Vision is lost or suddenly gets worse in one or both eyes. This information is not intended to replace advice given to you by your health care provider. Make sure you discuss any questions you have with your health care provider. Document Released: 01/29/2002 Document Revised: 07/17/2015 Document Reviewed: 05/22/2014 Elsevier Interactive Patient Education  2018 [employer]    Dementia Dementia is the loss of two or more brain functions, such as:  Memory.  Decision making.  Behavior.  Speaking.  Thinking.  Problem solving.  There are many types of dementia. The most common type is called progressive dementia. Progressive dementia gets worse with time and it is irreversible. An example of this type of dementia is Alzheimer  disease. What are the causes? This condition may be caused by:  Nerve cell damage in the brain.  Genetic mutations.  Certain medicines.  Multiple small strokes.  An infection, such as chronic meningitis.  A metabolic problem, such as vitamin B12 deficiency or thyroid disease.  Pressure on the brain, such as from a tumor or blood clot.  What are the signs or symptoms? Symptoms of this condition include:  Sudden changes in mood.  Depression.  Problems with balance.  Changes in personality.  Poor short-term memory.  Agitation.  Delusions.  Hallucinations.  Having a hard time: ? Speaking thoughts. ? Finding words. ? Solving problems. ? Doing familiar tasks. ? Understanding familiar ideas.  How is this diagnosed? This condition is diagnosed with an assessment by your health care provider. During this assessment, your health care provider will talk with you and your family, friends, or caregivers about your symptoms. A thorough medical history will be taken, and you will have a physical exam and tests. Tests may include:  Lab tests, such as blood or urine tests.  Imaging tests, such as a CT scan, PET scan, or MRI.  A lumbar puncture. This test involves removing and testing a small amount of the fluid that surrounds the brain and spinal cord.  An electroencephalogram (EEG). In this test, small metal discs are used to measure electrical activity in the brain.  Memory tests, cognitive tests, and neuropsychological tests. These tests evaluate brain function.  How is this treated? Treatment depends on the cause of the dementia. It may involve taking medicines that may help:  To control  the dementia.  To slow down the disease.  To manage symptoms.  In some cases, treating the cause of the dementia can improve symptoms, reverse symptoms, or slow down how quickly the dementia gets worse. Your health care provider can help direct you to support groups,  organizations, and other health care providers who can help with decisions about your care. Follow these instructions at home: Medicine  Take over-the-counter and prescription medicines only as told by your health care provider.  Avoid taking medicines that can affect thinking, such as pain or sleeping medicines. Lifestyle   Make healthy lifestyle choices: ? Be physically active as told by your health care provider. ? Do not use any tobacco products, such as cigarettes, chewing tobacco, and e-cigarettes. If you need help quitting, ask your health care provider. ? Eat a healthy diet. ? Practice stress-management techniques when you get stressed. ? Stay social.  Drink enough fluid to keep your urine clear or pale yellow.  Make sure to get quality sleep. These tips can help you to get a good night's rest: ? Avoid napping during the day. ? Keep your sleeping area dark and cool. ? Avoid exercising during the few hours before you go to bed. ? Avoid caffeine products in the evening. General instructions  Work with your health care provider to determine what you need help with and what your safety needs are.  If you were given a bracelet that tracks your location, make sure to wear it.  Keep all follow-up visits as told by your health care provider. This is important. Contact a health care provider if:  You have any new symptoms.  You have problems with choking or swallowing.  You have any symptoms of a different illness. Get help right away if:  You develop a fever.  You have new or worsening confusion.  You have new or worsening sleepiness.  You have a hard time staying awake.  You or your family members become concerned for your safety. This information is not intended to replace advice given to you by your health care provider. Make sure you discuss any questions you have with your health care provider. Document Released: 08/04/2000 Document Revised: 06/19/2015 Document  Reviewed: 11/06/2014 Elsevier Interactive Patient Education  2017 [employer]vier Inc.  Management of Memory Problems  There are some general things you can do to help manage your memory problems.  Your memory may not in fact recover, but by using techniques and strategies you will be able to manage your memory difficulties better.  1)  Establish a routine.  Try to establish and then stick to a regular routine.  By doing this, you will get used to what to expect and you will reduce the need to rely on your memory.  Also, try to do things at the same time of day, such as taking your medication or checking your calendar first thing in the morning.  Think about think that you can do as a part of a regular routine and make a list.  Then enter them into a daily planner to remind you.  This will help you establish a routine.  2)  Organize your environment.  Organize your environment so that it is uncluttered.  Decrease visual stimulation.  Place everyday items such as keys or cell phone in the same place every day (ie.  Basket next to front door)  Use post it notes with a brief message to yourself (ie. Turn off light, lock the door)  Use labels  to indicate where things go (ie. Which cupboards are for food, dishes, etc.)  Keep a notepad and pen by the telephone to take messages  3)  Memory Aids  A diary or journal/notebook/daily planner  Making a list (shopping list, chore list, to do list that needs to be done)  Using an alarm as a reminder (kitchen timer or cell phone alarm)  Using cell phone to store information (Notes, Calendar, Reminders)  Calendar/White board placed in a prominent position  Post-it notes  In order for memory aids to be useful, you need to have good habits.  It's no good remembering to make a note in your journal if you don't remember to look in it.  Try setting aside a certain time of day to look in journal.  4)  Improving mood and managing fatigue.  There may be  other factors that contribute to memory difficulties.  Factors, such as anxiety, depression and tiredness can affect memory.  Regular gentle exercise can help improve your mood and give you more energy.  Simple relaxation techniques may help relieve symptoms of anxiety  Try to get back to completing activities or hobbies you enjoyed doing in the past.  Learn to pace yourself through activities to decrease fatigue.  Find out about some local support groups where you can share experiences with others.  Try and achieve 7-8 hours of sleep at night.

## 2016-09-22 DIAGNOSIS — M4306 Spondylolysis, lumbar region: Secondary | ICD-10-CM | POA: Diagnosis not present

## 2016-09-22 DIAGNOSIS — M5416 Radiculopathy, lumbar region: Secondary | ICD-10-CM | POA: Diagnosis not present

## 2016-09-22 DIAGNOSIS — M5136 Other intervertebral disc degeneration, lumbar region: Secondary | ICD-10-CM | POA: Diagnosis not present

## 2016-09-22 LAB — HEPATIC FUNCTION PANEL
ALK PHOS: 52 U/L (ref 40–115)
ALT: 31 U/L (ref 9–46)
AST: 23 U/L (ref 10–35)
Albumin: 4.3 g/dL (ref 3.6–5.1)
BILIRUBIN DIRECT: 0.1 mg/dL (ref <=0.2)
BILIRUBIN INDIRECT: 0.7 mg/dL (ref 0.2–1.2)
BILIRUBIN TOTAL: 0.8 mg/dL (ref 0.2–1.2)
Total Protein: 6.2 g/dL (ref 6.1–8.1)

## 2016-09-22 LAB — LIPID PANEL
CHOL/HDL RATIO: 3.5 ratio (ref <5.0)
Cholesterol: 154 mg/dL (ref <200)
HDL: 44 mg/dL (ref >40)
LDL Cholesterol: 74 mg/dL (ref <100)
Triglycerides: 181 mg/dL — ABNORMAL HIGH (ref <150)
VLDL: 36 mg/dL — AB (ref <30)

## 2016-09-22 LAB — BASIC METABOLIC PANEL WITH GFR
BUN: 28 mg/dL — ABNORMAL HIGH (ref 7–25)
CHLORIDE: 99 mmol/L (ref 98–110)
CO2: 23 mmol/L (ref 20–31)
CREATININE: 1.19 mg/dL — AB (ref 0.70–1.18)
Calcium: 9.2 mg/dL (ref 8.6–10.3)
GFR, EST NON AFRICAN AMERICAN: 58 mL/min — AB (ref >=60)
GFR, Est African American: 67 mL/min (ref >=60)
Glucose, Bld: 164 mg/dL — ABNORMAL HIGH (ref 65–99)
POTASSIUM: 4 mmol/L (ref 3.5–5.3)
SODIUM: 137 mmol/L (ref 135–146)

## 2016-09-22 LAB — HEMOGLOBIN A1C
HEMOGLOBIN A1C: 8.6 % — AB (ref <5.7)
MEAN PLASMA GLUCOSE: 200 mg/dL

## 2016-09-22 LAB — MAGNESIUM: Magnesium: 1.5 mg/dL (ref 1.5–2.5)

## 2016-09-23 NOTE — Progress Notes (Signed)
LVM for pt to return office call for LAB results.

## 2016-09-24 ENCOUNTER — Other Ambulatory Visit: Payer: Self-pay | Admitting: Internal Medicine

## 2016-09-28 NOTE — Progress Notes (Signed)
Pt aware of lab results & voiced understanding of those results.

## 2016-10-19 DIAGNOSIS — Z6823 Body mass index (BMI) 23.0-23.9, adult: Secondary | ICD-10-CM | POA: Diagnosis not present

## 2016-10-19 DIAGNOSIS — M47816 Spondylosis without myelopathy or radiculopathy, lumbar region: Secondary | ICD-10-CM | POA: Diagnosis not present

## 2016-10-19 DIAGNOSIS — M5136 Other intervertebral disc degeneration, lumbar region: Secondary | ICD-10-CM | POA: Diagnosis not present

## 2016-10-19 DIAGNOSIS — M5416 Radiculopathy, lumbar region: Secondary | ICD-10-CM | POA: Diagnosis not present

## 2016-11-04 ENCOUNTER — Other Ambulatory Visit: Payer: Self-pay | Admitting: Internal Medicine

## 2016-11-11 ENCOUNTER — Other Ambulatory Visit: Payer: Self-pay | Admitting: Internal Medicine

## 2016-11-22 DIAGNOSIS — H353131 Nonexudative age-related macular degeneration, bilateral, early dry stage: Secondary | ICD-10-CM | POA: Diagnosis not present

## 2016-11-22 DIAGNOSIS — H2513 Age-related nuclear cataract, bilateral: Secondary | ICD-10-CM | POA: Diagnosis not present

## 2016-12-12 ENCOUNTER — Other Ambulatory Visit: Payer: Self-pay | Admitting: Physician Assistant

## 2016-12-23 NOTE — Patient Instructions (Signed)

## 2016-12-24 ENCOUNTER — Ambulatory Visit (INDEPENDENT_AMBULATORY_CARE_PROVIDER_SITE_OTHER): Payer: Medicare Other | Admitting: Internal Medicine

## 2016-12-24 ENCOUNTER — Encounter: Payer: Self-pay | Admitting: Internal Medicine

## 2016-12-24 VITALS — BP 180/78 | HR 60 | Temp 97.7°F | Resp 18 | Ht 68.5 in | Wt 164.4 lb

## 2016-12-24 DIAGNOSIS — E1122 Type 2 diabetes mellitus with diabetic chronic kidney disease: Secondary | ICD-10-CM | POA: Diagnosis not present

## 2016-12-24 DIAGNOSIS — K219 Gastro-esophageal reflux disease without esophagitis: Secondary | ICD-10-CM

## 2016-12-24 DIAGNOSIS — I700 Atherosclerosis of aorta: Secondary | ICD-10-CM | POA: Diagnosis not present

## 2016-12-24 DIAGNOSIS — N182 Chronic kidney disease, stage 2 (mild): Secondary | ICD-10-CM

## 2016-12-24 DIAGNOSIS — E782 Mixed hyperlipidemia: Secondary | ICD-10-CM

## 2016-12-24 DIAGNOSIS — Z23 Encounter for immunization: Secondary | ICD-10-CM | POA: Diagnosis not present

## 2016-12-24 DIAGNOSIS — Z1211 Encounter for screening for malignant neoplasm of colon: Secondary | ICD-10-CM

## 2016-12-24 DIAGNOSIS — E039 Hypothyroidism, unspecified: Secondary | ICD-10-CM

## 2016-12-24 DIAGNOSIS — Z1212 Encounter for screening for malignant neoplasm of rectum: Secondary | ICD-10-CM

## 2016-12-24 DIAGNOSIS — Z79899 Other long term (current) drug therapy: Secondary | ICD-10-CM

## 2016-12-24 DIAGNOSIS — N401 Enlarged prostate with lower urinary tract symptoms: Secondary | ICD-10-CM

## 2016-12-24 DIAGNOSIS — Z136 Encounter for screening for cardiovascular disorders: Secondary | ICD-10-CM

## 2016-12-24 DIAGNOSIS — E559 Vitamin D deficiency, unspecified: Secondary | ICD-10-CM | POA: Diagnosis not present

## 2016-12-24 DIAGNOSIS — Z794 Long term (current) use of insulin: Secondary | ICD-10-CM

## 2016-12-24 DIAGNOSIS — I10 Essential (primary) hypertension: Secondary | ICD-10-CM

## 2016-12-24 DIAGNOSIS — F17200 Nicotine dependence, unspecified, uncomplicated: Secondary | ICD-10-CM | POA: Diagnosis not present

## 2016-12-24 DIAGNOSIS — Z125 Encounter for screening for malignant neoplasm of prostate: Secondary | ICD-10-CM

## 2016-12-24 MED ORDER — AMLODIPINE BESYLATE 10 MG PO TABS
ORAL_TABLET | ORAL | 1 refills | Status: DC
Start: 2016-12-24 — End: 2017-07-06

## 2016-12-24 NOTE — Progress Notes (Signed)
[facility]   Gerald CowboyWilliam Micharl [name], M.D.     Dyanne CarrelAmanda R. Steffanie Dunnollier, P.A.-C Judd GaudierAshley Corbett, DNP [facility]                [address]                [place], [place] [id number] Telephone [phone] Telefax [phone] Annual  Screening/Preventative Visit  & Comprehensive Evaluation & Examination     This very nice 82 y.o. MWM presents for a Screening/Preventative Visit & comprehensive evaluation and management of multiple medical co-morbidities.  Patient has been followed for HTN, T2_IDDM, Hyperlipidemia and Vitamin D Deficiency.     Patient was seen in June by Dr Karel JarvisAquino for cognitive issues and she felt he had "Mild Dementia" and started Aricept. Patient has issues with poor short term recall and word finding.     HTN predates since 1989. Patient is uncertain if he took his am BP meds. HR is noted at 44 today on EKG. Today's BP is elevated at 180/78 and confirmed Patient denies any cardiac symptoms as chest pain, palpitations, shortness of breath, dizziness or ankle swelling.     Patient's hyperlipidemia is controlled with diet and medications. Patient denies myalgias or other medication SE's. Last lipids were at goal albeit elevated Trig's: Lab Results  Component Value Date   CHOL 154 09/21/2016   HDL 44 09/21/2016   LDLCALC 74 09/21/2016   TRIG 181 (H) 09/21/2016   CHOLHDL 3.5 09/21/2016      Patient has Insulin Requiring T2_IDDM (1999)  and patient denies reactive hypoglycemic symptoms, visual blurring, diabetic polys or paresthesias. Currently on Lantus Solostar 30 & 20 u bid and MF 500 mg  x 4.  Last A1c was not at goal:  Lab Results  Component Value Date   HGBA1C 8.6 (H) 09/21/2016        Patient has been on Thyroid Replacement circa 1992. Finally, patient has history of Vitamin D Deficiency of    and last vitamin D was  Lab Results  Component Value Date   VD25OH 60 06/11/2016   Current Outpatient Prescriptions  on File Prior to Visit  Medication Sig  . aspirin 81 MG tablet Take by mouth daily.   Marland Kitchen. atenolol (TENORMIN) 50 MG tablet TAKE 2 TABLETS BY MOUTH EVERY DAY  . baclofen (LIORESAL) 10 MG tablet TAKE 1/2 TO 1 TABLET BY MOUTH 2 TO 3 TIMES A DAY AS NEEDED FOR MUSCLE SPASMS  . BD PEN NEEDLE NANO U/F 32G X 4 MM MISC USE TO TEST SUGARS TWICE DAILY  . celecoxib (CELEBREX) 200 MG capsule TAKE 1 CAPSULE BY MOUTH DAILY FOR PAIN AND INFLAMMATION  . Cholecalciferol (VITAMIN D PO) Take 5,000 Units by mouth daily.  Marland Kitchen. donepezil (ARICEPT) 10 MG tablet Take 1/2 tablet once daily for one month, Then take 1 tablets once daily.  Marland Kitchen. ezetimibe (ZETIA) 10 MG tablet TAKE 1 TABLET BY MOUTH DAILY  . finasteride (PROSCAR) 5 MG tablet TAKE 1 TABLET BY MOUTH DAILY  . fish oil-omega-3 fatty acids 1000 MG capsule Take 1 g by mouth daily.   Marland Kitchen. LANTUS SOLOSTAR 100 UNIT/ML Solostar Pen USE AS DIRECTED  . levothyroxine (SYNTHROID, LEVOTHROID) 150 MCG tablet TAKE 1 TABLET BY MOUTH EVERY MORNING  . losartan-hydrochlorothiazide (HYZAAR) 100-25 MG tablet TAKE 1 TABLET BY MOUTH EVERY DAY FOR HIGH BLOOD PRESSURE  . metFORMIN (GLUCOPHAGE-XR) 500 MG 24 hr tablet TAKE 4 TABLETS BY MOUTH EVERY DAY FOR DIABETES  .  minocycline (MINOCIN,DYNACIN) 100 MG capsule TAKE 1 CAPSULE BY MOUTH DAILY  . Multiple Vitamin (MULTIVITAMIN) tablet Take 1 tablet by mouth daily.  Marland Kitchen MYRBETRIQ 50 MG TB24 tablet TAKE 1 TABLET BY MOUTH DAILY FOR BLADDER  . pantoprazole (PROTONIX) 40 MG tablet TAKE 1 TABLET BY MOUTH DAILY  . simvastatin (ZOCOR) 40 MG tablet TAKE 1 TABLET BY MOUTH EVERY NIGHT AT BEDTIME FOR CHOLESTEROL   No current facility-administered medications on file prior to visit.    Allergies  Allergen Reactions  . Ace Inhibitors    Past Medical History:  Diagnosis Date  . BPH (benign prostatic hypertrophy)   . Diabetes mellitus without complication (HCC)   . GERD (gastroesophageal reflux disease)   . Hyperlipidemia   . Hypertension   . Rotator cuff  tear, right RECURRENT  . Vitamin D deficiency    Health Maintenance  Topic Date Due  . INFLUENZA VACCINE  09/22/2016  . HEMOGLOBIN A1C  03/24/2017  . OPHTHALMOLOGY EXAM  07/07/2017  . FOOT EXAM  12/23/2017  . TETANUS/TDAP  10/31/2024  . PNA vac Low Risk Adult  Completed   Immunization History  Administered Date(s) Administered  . DT 11/01/2014  . Influenza Whole 11/10/2012  . Influenza, High Dose Seasonal PF 12/03/2013, 11/01/2014, 10/16/2015  . Pneumococcal Conjugate-13 12/03/2013  . Pneumococcal Polysaccharide-23 07/27/2011  . Tdap 02/23/2004  . Zoster 07/15/2005   Past Surgical History:  Procedure Laterality Date  . LUMBAR MICRODISCECTOMY  04-06-1999   L4 - 5  . RIGHT SHOULDER ARTHROSCOPY/ LABRAL & BICEPS TENDON DEBRIDEMENT/ DISTAL CLAVICLE DECOMPRESSION/ SAD/ MINI OPEN ROTATOR CUFF REPAIR  03-09-2010  . SHOULDER ARTHROSCOPY  04-10-2004   LEFT  . SHOULDER OPEN ROTATOR CUFF REPAIR  06/09/2011   Procedure: ROTATOR CUFF REPAIR SHOULDER OPEN;  Surgeon: Drucilla Schmidt, MD;  Location: [facility];  Service: Orthopedics;  Laterality: Right;  anterior acromionectomy repair right rotator cuff with tissue mend    Family History  Problem Relation Age of Onset  . Heart disease Mother   . Heart disease Father   . Stroke Father   . Cancer Father   . Cancer Sister   . Diabetes Maternal Grandmother   . Hypertension Sister   . Colon cancer Neg Hx   . Pancreatic cancer Neg Hx   . Stomach cancer Neg Hx   . Rectal cancer Neg Hx    Social History   Social History  . Marital status: Married    Spouse name: N/A  . Number of children: N/A  . Years of education: N/A   Occupational History  . Not on file.   Social History Main Topics  . Smoking status: Former Smoker    Types: Cigarettes    Quit date: 06/03/1990  . Smokeless tobacco: Never Used  . Alcohol use No     Comment: OCCASIONAL , WINE-no longer drinks alcohol  . Drug use: No  . Sexual activity: No    Other Topics Concern  . Not on file   Social History Narrative  . No narrative on file    ROS Constitutional: Denies fever, chills, weight loss/gain, headaches, insomnia,  night sweats or change in appetite. Does c/o fatigue. Eyes: Denies redness, blurred vision, diplopia, discharge, itchy or watery eyes.  ENT: Denies discharge, congestion, post nasal drip, epistaxis, sore throat, earache, hearing loss, dental pain, Tinnitus, Vertigo, Sinus pain or snoring.  Cardio: Denies chest pain, palpitations, irregular heartbeat, syncope, dyspnea, diaphoresis, orthopnea, PND, claudication or edema Respiratory: denies cough, dyspnea, DOE,  pleurisy, hoarseness, laryngitis or wheezing.  Gastrointestinal: Denies dysphagia, heartburn, reflux, water brash, pain, cramps, nausea, vomiting, bloating, diarrhea, constipation, hematemesis, melena, hematochezia, jaundice or hemorrhoids Genitourinary: Denies dysuria, frequency, urgency, nocturia, hesitancy, discharge, hematuria or flank pain Musculoskeletal: Denies arthralgia, myalgia, stiffness, Jt. Swelling, pain, limp or strain/sprain. Denies Falls. Skin: Denies puritis, rash, hives, warts, acne, eczema or change in skin lesion Neuro: No weakness, tremor, incoordination, spasms, paresthesia or pain Psychiatric: Denies confusion, memory loss or sensory loss. Denies Depression. Endocrine: Denies change in weight, skin, hair change, nocturia, and paresthesia, diabetic polys, visual blurring or hyper / hypo glycemic episodes.  Heme/Lymph: No excessive bleeding, bruising or enlarged lymph nodes.  Physical Exam  BP (!) 180/78   Pulse 60   Temp 97.7 F (36.5 C)   Resp 18   Ht 5' 8.5" (1.74 m)   Wt 164 lb 6.4 oz (74.6 kg)   BMI 24.63 kg/m   General Appearance: Well nourished and well groomed and in no apparent distress.  Eyes: PERRLA, EOMs, conjunctiva no swelling or erythema, normal fundi and vessels. Sinuses: No frontal/maxillary tenderness ENT/Mouth:  EACs patent / TMs  nl. Nares clear without erythema, swelling, mucoid exudates. Oral hygiene is good. No erythema, swelling, or exudate. Tongue normal, non-obstructing. Tonsils not swollen or erythematous. Hearing normal.  Neck: Supple, thyroid normal. No bruits, nodes or JVD. Respiratory: Respiratory effort normal.  BS equal and clear bilateral without rales, rhonci, wheezing or stridor. Cardio: Heart sounds are normal with regular rate and rhythm and no murmurs, rubs or gallops. Peripheral pulses are normal and equal bilaterally without edema. No aortic or femoral bruits. Chest: symmetric with normal excursions and percussion.  Abdomen: Soft, with Nl bowel sounds. Nontender, no guarding, rebound, hernias, masses, or organomegaly.  Lymphatics: Non tender without lymphadenopathy.  Genitourinary: No hernias.Testes nl. DRE - prostate nl for age - smooth & firm w/o nodules. Musculoskeletal: Full ROM all peripheral extremities, joint stability, 5/5 strength, and normal gait. Skin: Warm and dry without rashes, lesions, cyanosis, clubbing or  ecchymosis.  Neuro: Cranial nerves intact, reflexes equal bilaterally. Normal muscle tone, no cerebellar symptoms. Sensation intact.  Pysch: Alert and oriented x 3 with normal affect, insight and judgment poor. Short term recall poor.   Assessment and Plan  1. Essential hypertension  - EKG 12-Lead - [place], RETROPERITNL ABD,  LTD - Urinalysis, Routine w reflex microscopic - Microalbumin / creatinine urine ratio - CBC with Differential/Platelet - BASIC METABOLIC PANEL WITH GFR - Magnesium - TSH  - Taper Atenolol to 50 mg /daily for slow pulse and sent new Rx for  amLODipine (NORVASC) 10 MG tablet; Take 1 tablet every night for BP  Dispense: 90 tablet; Refill: 1 & advide monotor BP's bid til normal  2. Hyperlipidemia, mixed  - EKG 12-Lead - [place], RETROPERITNL ABD,  LTD - Hepatic function panel - Lipid panel - TSH  3. Type 2 diabetes mellitus with stage 2  chronic kidney disease, with long-term current use of insulin (HCC)  - EKG 12-Lead - [place], RETROPERITNL ABD,  LTD - Urinalysis, Routine w reflex microscopic - Microalbumin / creatinine urine ratio - HM DIABETES FOOT EXAM - LOW EXTREMITY NEUR EXAM DOCUM - Hemoglobin A1c  4. Vitamin D deficiency  - VITAMIN D 25 Hydroxy   5. Hypothyroidism  - TSH  6. Gastroesophageal reflux disease  - CBC with Differential/Platelet  7. Screening for colorectal cancer  - POC Hemoccult Bld/Stl   8. Prostate cancer screening  - PSA  9. Benign localized  prostatic hyperplasia with lower urinary tract symptoms (LUTS)  - PSA  10. Screening for ischemic heart disease  - EKG 12-Lead  11. Screening for AAA (aortic abdominal aneurysm)  - [place], RETROPERITNL ABD,  LTD  12. Smoker  - [place], RETROPERITNL ABD,  LTD  13. Thoracic aorta atherosclerosis (HCC)  - [place], RETROPERITNL ABD,  LTD  14. Medication management  - Urinalysis, Routine w reflex microscopic - Microalbumin / creatinine urine ratio - CBC with Differential/Platelet - BASIC METABOLIC PANEL WITH GFR - Hepatic function panel - Magnesium - Lipid panel - TSH - Hemoglobin A1c - VITAMIN D 25 Hydroxy   15. Need for immunization against influenza  - Flu vaccine HIGH DOSE PF (Fluzone High dose)           Patient was counseled in prudent diet, weight control to achieve/maintain BMI less than 25, BP monitoring, regular exercise and medications as discussed.  Discussed med effects and SE's. Routine screening labs and tests as requested with regular follow-up as recommended. Over 40 minutes of exam, counseling, chart review and high complex critical decision making was performed

## 2016-12-25 LAB — HEPATIC FUNCTION PANEL
AG Ratio: 2.3 (calc) (ref 1.0–2.5)
ALBUMIN MSPROF: 4.5 g/dL (ref 3.6–5.1)
ALT: 35 U/L (ref 9–46)
AST: 30 U/L (ref 10–35)
Alkaline phosphatase (APISO): 45 U/L (ref 40–115)
BILIRUBIN TOTAL: 0.7 mg/dL (ref 0.2–1.2)
Bilirubin, Direct: 0.1 mg/dL (ref 0.0–0.2)
GLOBULIN: 2 g/dL (ref 1.9–3.7)
Indirect Bilirubin: 0.6 mg/dL (calc) (ref 0.2–1.2)
Total Protein: 6.5 g/dL (ref 6.1–8.1)

## 2016-12-25 LAB — URINALYSIS, ROUTINE W REFLEX MICROSCOPIC
BACTERIA UA: NONE SEEN /HPF
BILIRUBIN URINE: NEGATIVE
HGB URINE DIPSTICK: NEGATIVE
Hyaline Cast: NONE SEEN /LPF
LEUKOCYTES UA: NEGATIVE
NITRITE: NEGATIVE
RBC / HPF: NONE SEEN /HPF (ref 0–2)
SQUAMOUS EPITHELIAL / LPF: NONE SEEN /HPF (ref <=5)
Specific Gravity, Urine: 1.025 (ref 1.001–1.03)
WBC UA: NONE SEEN /HPF (ref 0–5)
pH: 5.5 (ref 5.0–8.0)

## 2016-12-25 LAB — TSH: TSH: 4.33 mIU/L (ref 0.40–4.50)

## 2016-12-25 LAB — BASIC METABOLIC PANEL WITH GFR
BUN/Creatinine Ratio: 19 (calc) (ref 6–22)
BUN: 26 mg/dL — AB (ref 7–25)
CALCIUM: 9.8 mg/dL (ref 8.6–10.3)
CO2: 29 mmol/L (ref 20–32)
Chloride: 103 mmol/L (ref 98–110)
Creat: 1.37 mg/dL — ABNORMAL HIGH (ref 0.70–1.11)
GFR, EST NON AFRICAN AMERICAN: 48 mL/min/{1.73_m2} — AB (ref >=60)
GFR, Est African American: 56 mL/min/{1.73_m2} — ABNORMAL LOW (ref >=60)
Glucose, Bld: 173 mg/dL — ABNORMAL HIGH (ref 65–99)
POTASSIUM: 5.3 mmol/L (ref 3.5–5.3)
Sodium: 140 mmol/L (ref 135–146)

## 2016-12-25 LAB — LIPID PANEL
CHOL/HDL RATIO: 3.7 (calc) (ref <5.0)
Cholesterol: 168 mg/dL (ref <200)
HDL: 46 mg/dL (ref >40)
LDL CHOLESTEROL (CALC): 86 mg/dL
Non-HDL Cholesterol (Calc): 122 mg/dL (calc) (ref <130)
TRIGLYCERIDES: 301 mg/dL — AB (ref <150)

## 2016-12-25 LAB — CBC WITH DIFFERENTIAL/PLATELET
BASOS PCT: 1.2 %
Basophils Absolute: 72 cells/uL (ref 0–200)
EOS PCT: 2.5 %
Eosinophils Absolute: 150 cells/uL (ref 15–500)
HEMATOCRIT: 44.4 % (ref 38.5–50.0)
HEMOGLOBIN: 15.8 g/dL (ref 13.2–17.1)
LYMPHS ABS: 1692 {cells}/uL (ref 850–3900)
MCH: 31.9 pg (ref 27.0–33.0)
MCHC: 35.6 g/dL (ref 32.0–36.0)
MCV: 89.7 fL (ref 80.0–100.0)
MPV: 9.3 fL (ref 7.5–12.5)
Monocytes Relative: 8.3 %
NEUTROS ABS: 3588 {cells}/uL (ref 1500–7800)
NEUTROS PCT: 59.8 %
Platelets: 160 10*3/uL (ref 140–400)
RBC: 4.95 10*6/uL (ref 4.20–5.80)
RDW: 12.6 % (ref 11.0–15.0)
Total Lymphocyte: 28.2 %
WBC mixed population: 498 cells/uL (ref 200–950)
WBC: 6 10*3/uL (ref 3.8–10.8)

## 2016-12-25 LAB — HEMOGLOBIN A1C
EAG (MMOL/L): 10 (calc)
HEMOGLOBIN A1C: 7.9 %{Hb} — AB (ref <5.7)
MEAN PLASMA GLUCOSE: 180 (calc)

## 2016-12-25 LAB — PSA: PSA: 0.1 ng/mL (ref <=4.0)

## 2016-12-25 LAB — MICROALBUMIN / CREATININE URINE RATIO
Creatinine, Urine: 240 mg/dL (ref 20–320)
MICROALB/CREAT RATIO: 46 ug/mg{creat} — AB (ref <30)
Microalb, Ur: 11 mg/dL

## 2016-12-25 LAB — MAGNESIUM: MAGNESIUM: 1.9 mg/dL (ref 1.5–2.5)

## 2016-12-25 LAB — VITAMIN D 25 HYDROXY (VIT D DEFICIENCY, FRACTURES): Vit D, 25-Hydroxy: 66 ng/mL (ref 30–100)

## 2016-12-31 ENCOUNTER — Telehealth: Payer: Self-pay | Admitting: *Deleted

## 2016-12-31 NOTE — Telephone Encounter (Signed)
Per Dr Oneta RackMcKeown, the patient's BP and pulse readings have improved.He advised the patient regarding his new RX, Amlodipine 10 mg, to increase the dose to 1 whole tablet, if he is taking 1/2 tablet. He advised to continue on the whole tablet if he is already on this dose.  A message was left to inform the patient .

## 2017-01-02 ENCOUNTER — Other Ambulatory Visit: Payer: Self-pay | Admitting: Internal Medicine

## 2017-01-02 DIAGNOSIS — I10 Essential (primary) hypertension: Secondary | ICD-10-CM

## 2017-01-02 MED ORDER — ATENOLOL 100 MG PO TABS: ORAL_TABLET | ORAL | 3 refills | Status: DC

## 2017-01-04 ENCOUNTER — Other Ambulatory Visit: Payer: Self-pay | Admitting: Physician Assistant

## 2017-01-04 ENCOUNTER — Other Ambulatory Visit: Payer: Self-pay | Admitting: Internal Medicine

## 2017-01-19 ENCOUNTER — Other Ambulatory Visit: Payer: Self-pay

## 2017-01-19 DIAGNOSIS — Z1211 Encounter for screening for malignant neoplasm of colon: Secondary | ICD-10-CM | POA: Diagnosis not present

## 2017-01-19 DIAGNOSIS — Z1212 Encounter for screening for malignant neoplasm of rectum: Principal | ICD-10-CM

## 2017-01-19 LAB — POC HEMOCCULT BLD/STL (HOME/3-CARD/SCREEN): FECAL OCCULT BLD: NEGATIVE

## 2017-01-27 DIAGNOSIS — M5116 Intervertebral disc disorders with radiculopathy, lumbar region: Secondary | ICD-10-CM | POA: Diagnosis not present

## 2017-01-27 DIAGNOSIS — M4726 Other spondylosis with radiculopathy, lumbar region: Secondary | ICD-10-CM | POA: Diagnosis not present

## 2017-01-27 DIAGNOSIS — N182 Chronic kidney disease, stage 2 (mild): Secondary | ICD-10-CM | POA: Diagnosis not present

## 2017-01-27 DIAGNOSIS — I129 Hypertensive chronic kidney disease with stage 1 through stage 4 chronic kidney disease, or unspecified chronic kidney disease: Secondary | ICD-10-CM | POA: Diagnosis not present

## 2017-01-27 DIAGNOSIS — E1122 Type 2 diabetes mellitus with diabetic chronic kidney disease: Secondary | ICD-10-CM | POA: Diagnosis not present

## 2017-01-27 DIAGNOSIS — M48062 Spinal stenosis, lumbar region with neurogenic claudication: Secondary | ICD-10-CM | POA: Diagnosis not present

## 2017-02-07 ENCOUNTER — Encounter: Payer: Self-pay | Admitting: Neurology

## 2017-02-07 ENCOUNTER — Ambulatory Visit (INDEPENDENT_AMBULATORY_CARE_PROVIDER_SITE_OTHER): Payer: Medicare Other | Admitting: Neurology

## 2017-02-07 VITALS — BP 146/60 | HR 50 | Ht 68.5 in | Wt 148.0 lb

## 2017-02-07 DIAGNOSIS — R4789 Other speech disturbances: Secondary | ICD-10-CM

## 2017-02-07 DIAGNOSIS — F0390 Unspecified dementia without behavioral disturbance: Secondary | ICD-10-CM

## 2017-02-07 DIAGNOSIS — F03A0 Unspecified dementia, mild, without behavioral disturbance, psychotic disturbance, mood disturbance, and anxiety: Secondary | ICD-10-CM

## 2017-02-07 MED ORDER — DONEPEZIL HCL 10 MG PO TABS: ORAL_TABLET | ORAL | 3 refills | Status: DC

## 2017-02-07 NOTE — Patient Instructions (Signed)
1. Continue Aricept 10mg daily 2. Follow-up in 6 months, call for any changes  FALL PRECAUTIONS: Be cautious when walking. Scan the area for obstacles that may increase the risk of trips and falls. When getting up in the mornings, sit up at the edge of the bed for a few minutes before getting out of bed. Consider elevating the bed at the head end to avoid drop of blood pressure when getting up. Walk always in a well-lit room (use night lights in the walls). Avoid area rugs or power cords from appliances in the middle of the walkways. Use a walker or a cane if necessary and consider physical therapy for balance exercise. Get your eyesight checked regularly.  FINANCIAL OVERSIGHT: Supervision, especially oversight when making financial decisions or transactions is also recommended.  HOME SAFETY: Consider the safety of the kitchen when operating appliances like stoves, microwave oven, and blender. Consider having supervision and share cooking responsibilities until no longer able to participate in those. Accidents with firearms and other hazards in the house should be identified and addressed as well.  DRIVING: Regarding driving, in patients with progressive memory problems, driving will be impaired. We advise to have someone else do the driving if trouble finding directions or if minor accidents are reported. Independent driving assessment is available to determine safety of driving.  ABILITY TO BE LEFT ALONE: If patient is unable to contact 911 operator, consider using LifeLine, or when the need is there, arrange for someone to stay with patients. Smoking is a fire hazard, consider supervision or cessation. Risk of wandering should be assessed by caregiver and if detected at any point, supervision and safe proof recommendations should be instituted.  MEDICATION SUPERVISION: Inability to self-administer medication needs to be constantly addressed. Implement a mechanism to ensure safe administration of the  medications.  RECOMMENDATIONS FOR ALL PATIENTS WITH MEMORY PROBLEMS: 1. Continue to exercise (Recommend 30 minutes of walking everyday, or 3 hours every week) 2. Increase social interactions - continue going to Church and enjoy social gatherings with friends and family 3. Eat healthy, avoid fried foods and eat more fruits and vegetables 4. Maintain adequate blood pressure, blood sugar, and blood cholesterol level. Reducing the risk of stroke and cardiovascular disease also helps promoting better memory. 5. Avoid stressful situations. Live a simple life and avoid aggravations. Organize your time and prepare for the next day in anticipation. 6. Sleep well, avoid any interruptions of sleep and avoid any distractions in the bedroom that may interfere with adequate sleep quality 7. Avoid sugar, avoid sweets as there is a strong link between excessive sugar intake, diabetes, and cognitive impairment We discussed the Mediterranean diet, which has been shown to help patients reduce the risk of progressive memory disorders and reduces cardiovascular risk. This includes eating fish, eat fruits and green leafy vegetables, nuts like almonds and hazelnuts, walnuts, and also use olive oil. Avoid fast foods and fried foods as much as possible. Avoid sweets and sugar as sugar use has been linked to worsening of memory function.  There is always a concern of gradual progression of memory problems. If this is the case, then we may need to adjust level of care according to patient needs. Support, both to the patient and caregiver, should then be put into place.  

## 2017-02-07 NOTE — Progress Notes (Signed)
NEUROLOGY FOLLOW UP OFFICE NOTE  Gerald [name] [id number] [birth year]  HISTORY OF PRESENT ILLNESS: I had the pleasure of seeing Gerald [name] in follow-up in the neurology clinic on 02/07/2017.  The patient was last seen 6 months ago for mild dementia and is again accompanied by his wife who supplements the history today.  Records and images were personally reviewed where available.  B12 level normal. He was mostly reporting word-finding difficulties and was referred to Speech Therapy but did not follow through. He feels good, he states he does not remember things a lot. His wife reports some worsening, she keeps up with his medications now. He denies getting lost driving, but his wife reports they would sit at a stoplight arguing, so he has stopped driving after being told by his PCP to stop. His wife is in charge of finances. No personality changes, paranoia, or hallucinations. He is taking Aricept 10mg  daily without side effects. His main concern is his back pain, they are considering surgery after the holidays.   HPI 08/02/2016: This is an 82 yo RH man with a history of hypertension, hyperlipidemia, diabetes, hypothyroidism, who presented for evaluation of memory loss with word-finding difficulties. He feels his memory is good except for "not knowing things that have happened." His wife started noticing memory changes for around 5 months, he would have difficulty trying to say a sentence, stopping and trying to grasp for the next word. Recently he forgets names of his grandchildren. He forgets where he puts things. He has not been driving as much after getting turned around 2 months ago with his wife in the car with him. 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 He fills out his own pillbox and states he takes his medications okay. He states he is here because he could not answer questions at Dr. Kathryne SharperMcKeown's office  one time. His wife denies any personality changes, no hallucinations. He is independent with ADLs. His last fall was 3-4 months ago.  He has had anosmia for at least 60 years. He has chronic back pain. He denies any headaches, dizziness, diplopia, dysarthria, dysphagia, neck pain, focal numbness/tingling/weakness, bowel/bladder dysfunction. No tremors, no falls. He denies any significant head injuries, no alcohol use. No family history of dementia.  I personally reviewed MRI brain without contrast done 06/23/2016 which did not show any acute changes. There was a small area of cortical encephalomalacia in the left anterior superior frontal gyrus and left middle temporal gyrus. There was moderate chronic microvascular disease, more pronounced on the left hemisphere. There was also scattered bilateral gyriform hemosiderin affecting mostly the anterior and superior frontal lobes, left occipital and lateral left temporal lobe, with diagnosis of superficial siderosis, sequelae of prior intracranial hemorrhage.  PAST MEDICAL HISTORY: Past Medical History:  Diagnosis Date  . BPH (benign prostatic hypertrophy)   . Diabetes mellitus without complication (HCC)   . GERD (gastroesophageal reflux disease)   . Hyperlipidemia   . Hypertension   . Rotator cuff tear, right RECURRENT  . Vitamin D deficiency     MEDICATIONS: Current Outpatient Medications on File Prior to Visit  Medication Sig Dispense Refill  . amLODipine (NORVASC) 10 MG tablet Take 1 tablet every night for BP 90 tablet 1  . aspirin 81 MG tablet Take by mouth daily.     Marland Kitchen. atenolol (TENORMIN) 100 MG tablet Take 1 tablet daily for BP 90 tablet  3  . atenolol (TENORMIN) 50 MG tablet TAKE 2 TABLETS BY MOUTH EVERY DAY 180 tablet 1  . B-D ULTRAFINE III SHORT PEN 31G X 8 MM MISC USE UTD BID  1  . baclofen (LIORESAL) 10 MG tablet TAKE 1/2 TO 1 TABLET BY MOUTH 2 TO 3 TIMES A DAY AS NEEDED FOR MUSCLE SPASMS 270 tablet 1  . celecoxib (CELEBREX) 200 MG  capsule TAKE 1 CAPSULE BY MOUTH DAILY FOR PAIN AND INFLAMMATION 90 capsule 0  . Cholecalciferol (VITAMIN D PO) Take 5,000 Units by mouth daily.    Marland Kitchen donepezil (ARICEPT) 10 MG tablet Take 1/2 tablet once daily for one month, Then take 1 tablets once daily. (Patient taking 1 tablet daily) 30 tablet 6  . ezetimibe (ZETIA) 10 MG tablet TAKE 1 TABLET BY MOUTH DAILY 90 tablet 1  . finasteride (PROSCAR) 5 MG tablet TAKE 1 TABLET BY MOUTH DAILY 90 tablet 0  . fish oil-omega-3 fatty acids 1000 MG capsule Take 1 g by mouth daily.     Marland Kitchen gabapentin (NEURONTIN) 300 MG capsule Take 300 mg by mouth 3 (three) times daily.    Marland Kitchen HYDROcodone-acetaminophen (NORCO/VICODIN) 5-325 MG tablet Take 1 tablet by mouth every 6 (six) hours as needed for moderate pain.    Marland Kitchen LANTUS SOLOSTAR 100 UNIT/ML Solostar Pen USE AS DIRECTED 45 mL 1  . levothyroxine (SYNTHROID, LEVOTHROID) 150 MCG tablet TAKE 1 TABLET BY MOUTH EVERY MORNING 90 tablet 1  . losartan-hydrochlorothiazide (HYZAAR) 100-25 MG tablet TAKE 1 TABLET BY MOUTH EVERY DAY FOR HIGH BLOOD PRESSURE 90 tablet 1  . metFORMIN (GLUCOPHAGE-XR) 500 MG 24 hr tablet TAKE 4 TABLETS BY MOUTH EVERY DAY FOR DIABETES 360 tablet 3  . minocycline (MINOCIN,DYNACIN) 100 MG capsule TAKE 1 CAPSULE BY MOUTH DAILY 90 capsule 3  . Multiple Vitamin (MULTIVITAMIN) tablet Take 1 tablet by mouth daily.    Marland Kitchen MYRBETRIQ 50 MG TB24 tablet TAKE 1 TABLET BY MOUTH DAILY FOR BLADDER 90 tablet 1  . pantoprazole (PROTONIX) 40 MG tablet TAKE 1 TABLET BY MOUTH DAILY 90 tablet 0  . simvastatin (ZOCOR) 40 MG tablet TAKE 1 TABLET BY MOUTH EVERY NIGHT AT BEDTIME FOR CHOLESTEROL 90 tablet PRN   No current facility-administered medications on file prior to visit.     ALLERGIES: Allergies  Allergen Reactions  . Ace Inhibitors     FAMILY HISTORY: Family History  Problem Relation Age of Onset  . Heart disease Mother   . Heart disease Father   . Stroke Father   . Cancer Father   . Cancer Sister   .  Diabetes Maternal Grandmother   . Hypertension Sister   . Colon cancer Neg Hx   . Pancreatic cancer Neg Hx   . Stomach cancer Neg Hx   . Rectal cancer Neg Hx     SOCIAL HISTORY: Social History   Socioeconomic History  . Marital status: Married    Spouse name: Not on file  . Number of children: Not on file  . Years of education: Not on file  . Highest education level: Not on file  Social Needs  . Financial resource strain: Not on file  . Food insecurity - worry: Not on file  . Food insecurity - inability: Not on file  . Transportation needs - medical: Not on file  . Transportation needs - non-medical: Not on file  Occupational History  . Not on file  Tobacco Use  . Smoking status: Former Smoker    Types: Cigarettes  Last attempt to quit: 06/03/1990    Years since quitting: 26.7  . Smokeless tobacco: Never Used  Substance and Sexual Activity  . Alcohol use: No    Alcohol/week: 0.0 oz    Comment: OCCASIONAL , WINE-no longer drinks alcohol  . Drug use: No  . Sexual activity: No  Other Topics Concern  . Not on file  Social History Narrative  . Not on file    REVIEW OF SYSTEMS: Constitutional: No fevers, chills, or sweats, no generalized fatigue, change in appetite Eyes: No visual changes, double vision, eye pain Ear, nose and throat: No hearing loss, ear pain, nasal congestion, sore throat Cardiovascular: No chest pain, palpitations Respiratory:  No shortness of breath at rest or with exertion, wheezes GastrointestinaI: No nausea, vomiting, diarrhea, abdominal pain, fecal incontinence Genitourinary:  No dysuria, urinary retention or frequency Musculoskeletal:  No neck pain, +back pain Integumentary: No rash, pruritus, skin lesions Neurological: as above Psychiatric: No depression, insomnia, anxiety Endocrine: No palpitations, fatigue, diaphoresis, mood swings, change in appetite, change in weight, increased thirst Hematologic/Lymphatic:  No anemia, purpura,  petechiae. Allergic/Immunologic: no itchy/runny eyes, nasal congestion, recent allergic reactions, rashes  PHYSICAL EXAM: Vitals:   02/07/17 1522  BP: (!) 146/60  Pulse: (!) 50  SpO2: 97%   General: No acute distress Head:  Normocephalic/atraumatic Neck: supple, no paraspinal tenderness, full range of motion Heart:  Regular rate and rhythm Lungs:  Clear to auscultation bilaterally Back: No paraspinal tenderness Skin/Extremities: No rash, no edema Neurological Exam: alert and oriented to person, place, month and day of week (could not say year. No dysarthria or aphasia, but noted to have difficulty with naming low frequency words. Recent and remote memory are impaired.  Attention and concentration are normal. Able to repeat. Cranial nerves: CN I: not tested CN II: pupils equal, round and reactive to light, visual fields intact CN III, IV, VI:  full range of motion, no nystagmus, no ptosis CN V: facial sensation intact CN VII: upper and lower face symmetric Bulk & Tone: normal, no fasciculations, no cogwheeling Motor: 5/5 throughout except for left shoulder limited abduction post rotator cuff surgeries (similar to prior), no pronator drift. Sensation: intact to light touch.  Romberg test negative Deep Tendon Reflexes: +2 throughout, no ankle clonus Plantar responses: downgoing bilaterally Cerebellar: no incoordination on finger to nose testing Gait: slow and cautious due to back pain, no ataxia Tremor: none  IMPRESSION: This is a pleasant 82 yo RH man with a history of  hypertension, hyperlipidemia, diabetes, hypothyroidism, with mild dementia. He is again noted to have word-finding difficulties today. MMSE in June 2018 was 24/30. MRI brain showed a small area of cortical encephalomalacia in the left anterior superior frontal gyrus and left middle temporal gyrus; moderate chronic microvascular disease, more pronounced on the left hemisphere, as well as scattered bilateral gyriform  hemosiderin affecting mostly the anterior and superior frontal lobes, left occipital and lateral left temporal lobe, with diagnosis of superficial siderosis, sequelae of prior intracranial hemorrhage. Memory and speech difficulties likely of vascular etiology (vascular dementia), we again discussed speech therapy for word-finding difficulties, he would like to hold off and focus on his back issues for now. He is taking Aricept 10mg  daily without side effects. He has stopped driving. We discussed the importance of control of vascular risk factors, physical exercise, and brain stimulation exercises for brain health. He will follow-up in 6 months and knows to call for any changes.   Thank you for allowing me to participate  in his care.  Please do not hesitate to call for any questions or concerns.  The duration of this appointment visit was 25 minutes of face-to-face time with the patient.  Greater than 50% of this time was spent in counseling, explanation of diagnosis, planning of further management, and coordination of care.   Patrcia Dolly, M.D.   CC: Dr. Oneta Rack

## 2017-02-10 ENCOUNTER — Encounter: Payer: Self-pay | Admitting: Neurology

## 2017-02-13 ENCOUNTER — Other Ambulatory Visit: Payer: Self-pay | Admitting: Internal Medicine

## 2017-02-13 ENCOUNTER — Other Ambulatory Visit: Payer: Self-pay | Admitting: Physician Assistant

## 2017-02-21 ENCOUNTER — Other Ambulatory Visit: Payer: Self-pay | Admitting: Internal Medicine

## 2017-02-23 DIAGNOSIS — M5416 Radiculopathy, lumbar region: Secondary | ICD-10-CM | POA: Diagnosis not present

## 2017-02-23 DIAGNOSIS — M47816 Spondylosis without myelopathy or radiculopathy, lumbar region: Secondary | ICD-10-CM | POA: Diagnosis not present

## 2017-02-23 DIAGNOSIS — M5136 Other intervertebral disc degeneration, lumbar region: Secondary | ICD-10-CM | POA: Diagnosis not present

## 2017-02-23 DIAGNOSIS — M48062 Spinal stenosis, lumbar region with neurogenic claudication: Secondary | ICD-10-CM | POA: Diagnosis not present

## 2017-02-25 DIAGNOSIS — M5416 Radiculopathy, lumbar region: Secondary | ICD-10-CM | POA: Diagnosis not present

## 2017-02-25 DIAGNOSIS — M48062 Spinal stenosis, lumbar region with neurogenic claudication: Secondary | ICD-10-CM | POA: Diagnosis not present

## 2017-02-25 DIAGNOSIS — M48061 Spinal stenosis, lumbar region without neurogenic claudication: Secondary | ICD-10-CM | POA: Diagnosis not present

## 2017-03-07 DIAGNOSIS — M79605 Pain in left leg: Secondary | ICD-10-CM | POA: Diagnosis not present

## 2017-03-07 DIAGNOSIS — M4696 Unspecified inflammatory spondylopathy, lumbar region: Secondary | ICD-10-CM | POA: Diagnosis not present

## 2017-03-07 DIAGNOSIS — M533 Sacrococcygeal disorders, not elsewhere classified: Secondary | ICD-10-CM | POA: Diagnosis not present

## 2017-03-07 DIAGNOSIS — M419 Scoliosis, unspecified: Secondary | ICD-10-CM | POA: Diagnosis not present

## 2017-03-07 DIAGNOSIS — M8588 Other specified disorders of bone density and structure, other site: Secondary | ICD-10-CM | POA: Diagnosis not present

## 2017-03-07 DIAGNOSIS — M48062 Spinal stenosis, lumbar region with neurogenic claudication: Secondary | ICD-10-CM | POA: Diagnosis not present

## 2017-03-07 DIAGNOSIS — Z9889 Other specified postprocedural states: Secondary | ICD-10-CM | POA: Diagnosis not present

## 2017-03-07 DIAGNOSIS — M79604 Pain in right leg: Secondary | ICD-10-CM | POA: Diagnosis not present

## 2017-03-07 DIAGNOSIS — M5136 Other intervertebral disc degeneration, lumbar region: Secondary | ICD-10-CM | POA: Diagnosis not present

## 2017-03-07 DIAGNOSIS — M4316 Spondylolisthesis, lumbar region: Secondary | ICD-10-CM | POA: Diagnosis not present

## 2017-03-08 ENCOUNTER — Other Ambulatory Visit: Payer: Self-pay | Admitting: Internal Medicine

## 2017-03-15 DIAGNOSIS — Z981 Arthrodesis status: Secondary | ICD-10-CM | POA: Diagnosis not present

## 2017-03-15 DIAGNOSIS — M48062 Spinal stenosis, lumbar region with neurogenic claudication: Secondary | ICD-10-CM | POA: Diagnosis not present

## 2017-03-15 DIAGNOSIS — M5416 Radiculopathy, lumbar region: Secondary | ICD-10-CM | POA: Diagnosis not present

## 2017-03-15 HISTORY — PX: LUMBAR DISC SURGERY: SHX700

## 2017-03-16 DIAGNOSIS — R339 Retention of urine, unspecified: Secondary | ICD-10-CM | POA: Diagnosis not present

## 2017-03-16 DIAGNOSIS — L603 Nail dystrophy: Secondary | ICD-10-CM | POA: Diagnosis present

## 2017-03-16 DIAGNOSIS — N182 Chronic kidney disease, stage 2 (mild): Secondary | ICD-10-CM | POA: Diagnosis present

## 2017-03-16 DIAGNOSIS — E785 Hyperlipidemia, unspecified: Secondary | ICD-10-CM | POA: Diagnosis present

## 2017-03-16 DIAGNOSIS — I129 Hypertensive chronic kidney disease with stage 1 through stage 4 chronic kidney disease, or unspecified chronic kidney disease: Secondary | ICD-10-CM | POA: Diagnosis present

## 2017-03-16 DIAGNOSIS — M6281 Muscle weakness (generalized): Secondary | ICD-10-CM | POA: Diagnosis present

## 2017-03-16 DIAGNOSIS — R4182 Altered mental status, unspecified: Secondary | ICD-10-CM | POA: Diagnosis not present

## 2017-03-16 DIAGNOSIS — K219 Gastro-esophageal reflux disease without esophagitis: Secondary | ICD-10-CM | POA: Diagnosis present

## 2017-03-16 DIAGNOSIS — R2681 Unsteadiness on feet: Secondary | ICD-10-CM | POA: Diagnosis present

## 2017-03-16 DIAGNOSIS — G8929 Other chronic pain: Secondary | ICD-10-CM | POA: Diagnosis present

## 2017-03-16 DIAGNOSIS — B353 Tinea pedis: Secondary | ICD-10-CM | POA: Diagnosis present

## 2017-03-16 DIAGNOSIS — I10 Essential (primary) hypertension: Secondary | ICD-10-CM | POA: Diagnosis present

## 2017-03-16 DIAGNOSIS — E1122 Type 2 diabetes mellitus with diabetic chronic kidney disease: Secondary | ICD-10-CM | POA: Diagnosis present

## 2017-03-16 DIAGNOSIS — M549 Dorsalgia, unspecified: Secondary | ICD-10-CM | POA: Diagnosis not present

## 2017-03-16 DIAGNOSIS — L600 Ingrowing nail: Secondary | ICD-10-CM | POA: Diagnosis present

## 2017-03-16 DIAGNOSIS — E1129 Type 2 diabetes mellitus with other diabetic kidney complication: Secondary | ICD-10-CM | POA: Diagnosis present

## 2017-03-16 DIAGNOSIS — Z4889 Encounter for other specified surgical aftercare: Secondary | ICD-10-CM | POA: Diagnosis not present

## 2017-03-16 DIAGNOSIS — F0390 Unspecified dementia without behavioral disturbance: Secondary | ICD-10-CM | POA: Diagnosis present

## 2017-03-16 DIAGNOSIS — E039 Hypothyroidism, unspecified: Secondary | ICD-10-CM | POA: Diagnosis present

## 2017-03-16 DIAGNOSIS — M48062 Spinal stenosis, lumbar region with neurogenic claudication: Secondary | ICD-10-CM | POA: Diagnosis present

## 2017-03-19 DIAGNOSIS — R338 Other retention of urine: Secondary | ICD-10-CM | POA: Diagnosis not present

## 2017-03-19 DIAGNOSIS — M549 Dorsalgia, unspecified: Secondary | ICD-10-CM | POA: Diagnosis not present

## 2017-03-19 DIAGNOSIS — E1129 Type 2 diabetes mellitus with other diabetic kidney complication: Secondary | ICD-10-CM | POA: Diagnosis present

## 2017-03-19 DIAGNOSIS — R2681 Unsteadiness on feet: Secondary | ICD-10-CM | POA: Diagnosis present

## 2017-03-19 DIAGNOSIS — F0390 Unspecified dementia without behavioral disturbance: Secondary | ICD-10-CM | POA: Diagnosis present

## 2017-03-19 DIAGNOSIS — M6281 Muscle weakness (generalized): Secondary | ICD-10-CM | POA: Diagnosis present

## 2017-03-19 DIAGNOSIS — E119 Type 2 diabetes mellitus without complications: Secondary | ICD-10-CM | POA: Diagnosis not present

## 2017-03-19 DIAGNOSIS — G8929 Other chronic pain: Secondary | ICD-10-CM | POA: Diagnosis present

## 2017-03-19 DIAGNOSIS — E785 Hyperlipidemia, unspecified: Secondary | ICD-10-CM | POA: Diagnosis present

## 2017-03-19 DIAGNOSIS — Z4889 Encounter for other specified surgical aftercare: Secondary | ICD-10-CM | POA: Diagnosis not present

## 2017-03-19 DIAGNOSIS — R4182 Altered mental status, unspecified: Secondary | ICD-10-CM | POA: Diagnosis not present

## 2017-03-19 DIAGNOSIS — M48062 Spinal stenosis, lumbar region with neurogenic claudication: Secondary | ICD-10-CM | POA: Diagnosis present

## 2017-03-19 DIAGNOSIS — I10 Essential (primary) hypertension: Secondary | ICD-10-CM | POA: Diagnosis present

## 2017-03-21 DIAGNOSIS — I10 Essential (primary) hypertension: Secondary | ICD-10-CM | POA: Diagnosis not present

## 2017-03-21 DIAGNOSIS — R338 Other retention of urine: Secondary | ICD-10-CM | POA: Diagnosis not present

## 2017-03-21 DIAGNOSIS — E119 Type 2 diabetes mellitus without complications: Secondary | ICD-10-CM | POA: Diagnosis not present

## 2017-03-25 DIAGNOSIS — R338 Other retention of urine: Secondary | ICD-10-CM | POA: Diagnosis not present

## 2017-03-25 DIAGNOSIS — E119 Type 2 diabetes mellitus without complications: Secondary | ICD-10-CM | POA: Diagnosis not present

## 2017-04-01 ENCOUNTER — Ambulatory Visit: Payer: Self-pay | Admitting: Adult Health

## 2017-04-01 DIAGNOSIS — E119 Type 2 diabetes mellitus without complications: Secondary | ICD-10-CM | POA: Diagnosis not present

## 2017-04-01 DIAGNOSIS — F0390 Unspecified dementia without behavioral disturbance: Secondary | ICD-10-CM | POA: Diagnosis not present

## 2017-04-01 DIAGNOSIS — E039 Hypothyroidism, unspecified: Secondary | ICD-10-CM | POA: Diagnosis not present

## 2017-04-01 DIAGNOSIS — I10 Essential (primary) hypertension: Secondary | ICD-10-CM | POA: Diagnosis not present

## 2017-04-01 DIAGNOSIS — M48062 Spinal stenosis, lumbar region with neurogenic claudication: Secondary | ICD-10-CM | POA: Diagnosis not present

## 2017-04-01 DIAGNOSIS — R339 Retention of urine, unspecified: Secondary | ICD-10-CM | POA: Diagnosis not present

## 2017-04-01 DIAGNOSIS — Z4789 Encounter for other orthopedic aftercare: Secondary | ICD-10-CM | POA: Diagnosis not present

## 2017-04-01 DIAGNOSIS — E785 Hyperlipidemia, unspecified: Secondary | ICD-10-CM | POA: Diagnosis not present

## 2017-04-01 DIAGNOSIS — Z794 Long term (current) use of insulin: Secondary | ICD-10-CM | POA: Diagnosis not present

## 2017-04-01 DIAGNOSIS — K219 Gastro-esophageal reflux disease without esophagitis: Secondary | ICD-10-CM | POA: Diagnosis not present

## 2017-04-04 DIAGNOSIS — F0390 Unspecified dementia without behavioral disturbance: Secondary | ICD-10-CM | POA: Diagnosis not present

## 2017-04-04 DIAGNOSIS — Z4789 Encounter for other orthopedic aftercare: Secondary | ICD-10-CM | POA: Diagnosis not present

## 2017-04-04 DIAGNOSIS — M48062 Spinal stenosis, lumbar region with neurogenic claudication: Secondary | ICD-10-CM | POA: Diagnosis not present

## 2017-04-04 DIAGNOSIS — E119 Type 2 diabetes mellitus without complications: Secondary | ICD-10-CM | POA: Diagnosis not present

## 2017-04-04 DIAGNOSIS — I10 Essential (primary) hypertension: Secondary | ICD-10-CM | POA: Diagnosis not present

## 2017-04-04 DIAGNOSIS — R339 Retention of urine, unspecified: Secondary | ICD-10-CM | POA: Diagnosis not present

## 2017-04-05 ENCOUNTER — Other Ambulatory Visit: Payer: Self-pay | Admitting: Neurology

## 2017-04-05 DIAGNOSIS — F03A0 Unspecified dementia, mild, without behavioral disturbance, psychotic disturbance, mood disturbance, and anxiety: Secondary | ICD-10-CM

## 2017-04-05 DIAGNOSIS — F0390 Unspecified dementia without behavioral disturbance: Secondary | ICD-10-CM

## 2017-04-05 DIAGNOSIS — R4789 Other speech disturbances: Secondary | ICD-10-CM

## 2017-04-06 DIAGNOSIS — E119 Type 2 diabetes mellitus without complications: Secondary | ICD-10-CM | POA: Diagnosis not present

## 2017-04-06 DIAGNOSIS — M48062 Spinal stenosis, lumbar region with neurogenic claudication: Secondary | ICD-10-CM | POA: Diagnosis not present

## 2017-04-06 DIAGNOSIS — R339 Retention of urine, unspecified: Secondary | ICD-10-CM | POA: Diagnosis not present

## 2017-04-06 DIAGNOSIS — F0390 Unspecified dementia without behavioral disturbance: Secondary | ICD-10-CM | POA: Diagnosis not present

## 2017-04-06 DIAGNOSIS — Z4789 Encounter for other orthopedic aftercare: Secondary | ICD-10-CM | POA: Diagnosis not present

## 2017-04-06 DIAGNOSIS — I10 Essential (primary) hypertension: Secondary | ICD-10-CM | POA: Diagnosis not present

## 2017-04-07 ENCOUNTER — Other Ambulatory Visit: Payer: Self-pay | Admitting: Internal Medicine

## 2017-04-07 DIAGNOSIS — I10 Essential (primary) hypertension: Secondary | ICD-10-CM | POA: Diagnosis not present

## 2017-04-07 DIAGNOSIS — M48062 Spinal stenosis, lumbar region with neurogenic claudication: Secondary | ICD-10-CM | POA: Diagnosis not present

## 2017-04-07 DIAGNOSIS — E119 Type 2 diabetes mellitus without complications: Secondary | ICD-10-CM | POA: Diagnosis not present

## 2017-04-07 DIAGNOSIS — R339 Retention of urine, unspecified: Secondary | ICD-10-CM | POA: Diagnosis not present

## 2017-04-07 DIAGNOSIS — Z4789 Encounter for other orthopedic aftercare: Secondary | ICD-10-CM | POA: Diagnosis not present

## 2017-04-07 DIAGNOSIS — F0390 Unspecified dementia without behavioral disturbance: Secondary | ICD-10-CM | POA: Diagnosis not present

## 2017-04-11 DIAGNOSIS — E119 Type 2 diabetes mellitus without complications: Secondary | ICD-10-CM | POA: Diagnosis not present

## 2017-04-11 DIAGNOSIS — F0390 Unspecified dementia without behavioral disturbance: Secondary | ICD-10-CM | POA: Diagnosis not present

## 2017-04-11 DIAGNOSIS — I10 Essential (primary) hypertension: Secondary | ICD-10-CM | POA: Diagnosis not present

## 2017-04-11 DIAGNOSIS — Z4789 Encounter for other orthopedic aftercare: Secondary | ICD-10-CM | POA: Diagnosis not present

## 2017-04-11 DIAGNOSIS — R339 Retention of urine, unspecified: Secondary | ICD-10-CM | POA: Diagnosis not present

## 2017-04-11 DIAGNOSIS — M48062 Spinal stenosis, lumbar region with neurogenic claudication: Secondary | ICD-10-CM | POA: Diagnosis not present

## 2017-04-13 DIAGNOSIS — E119 Type 2 diabetes mellitus without complications: Secondary | ICD-10-CM | POA: Diagnosis not present

## 2017-04-13 DIAGNOSIS — I10 Essential (primary) hypertension: Secondary | ICD-10-CM | POA: Diagnosis not present

## 2017-04-13 DIAGNOSIS — F0390 Unspecified dementia without behavioral disturbance: Secondary | ICD-10-CM | POA: Diagnosis not present

## 2017-04-13 DIAGNOSIS — M48062 Spinal stenosis, lumbar region with neurogenic claudication: Secondary | ICD-10-CM | POA: Diagnosis not present

## 2017-04-13 DIAGNOSIS — Z4789 Encounter for other orthopedic aftercare: Secondary | ICD-10-CM | POA: Diagnosis not present

## 2017-04-13 DIAGNOSIS — R339 Retention of urine, unspecified: Secondary | ICD-10-CM | POA: Diagnosis not present

## 2017-04-18 DIAGNOSIS — I10 Essential (primary) hypertension: Secondary | ICD-10-CM | POA: Diagnosis not present

## 2017-04-18 DIAGNOSIS — M48062 Spinal stenosis, lumbar region with neurogenic claudication: Secondary | ICD-10-CM | POA: Diagnosis not present

## 2017-04-18 DIAGNOSIS — R339 Retention of urine, unspecified: Secondary | ICD-10-CM | POA: Diagnosis not present

## 2017-04-18 DIAGNOSIS — F0390 Unspecified dementia without behavioral disturbance: Secondary | ICD-10-CM | POA: Diagnosis not present

## 2017-04-18 DIAGNOSIS — Z4789 Encounter for other orthopedic aftercare: Secondary | ICD-10-CM | POA: Diagnosis not present

## 2017-04-18 DIAGNOSIS — E119 Type 2 diabetes mellitus without complications: Secondary | ICD-10-CM | POA: Diagnosis not present

## 2017-04-19 NOTE — Progress Notes (Signed)
MEDICARE ANNUAL WELLNESS VISIT AND FOLLOW UP Assessment:   Diagnoses and all orders for this visit:  Encounter for Medicare annual wellness exam  Essential hypertension At goal; continue medication Monitor blood pressure at home; call if consistently over 130/80 Continue DASH diet.   Reminder to go to the ER if any CP, SOB, nausea, dizziness, severe HA, changes vision/speech, left arm numbness and tingling and jaw pain. -     Magnesium  Thoracic aorta atherosclerosis (HCC) Control blood pressure, cholesterol, glucose, increase exercise.   Gastroesophageal reflux disease, esophagitis presence not specified Symptoms well managed without breakthrough Will try to get off PPI given info for taper and zantac sent in -     Magnesium  CKD stage 3 due to type 2 diabetes mellitus (HCC) Increase fluids, avoid NSAIDS, monitor sugars, will monitor -     BASIC METABOLIC PANEL WITH GFR  Hypothyroidism, unspecified type continue medications the same pending lab results reminded to take on an empty stomach 30-50mins before food.  -     TSH  Insulin Dependent T2_IDDM Continue medications Continue diet and exercise.  Reviewed general goals; reminded to get annual eye exam and dental exams Foot exam performed today Perform daily foot/skin check, notify office of any concerning changes.  -     Hemoglobin A1c  Mild dementia On aricept - able to complete ADLs; discussed exercises, will continue to monitor  Complete rotator cuff rupture of left shoulder Patient has declined surgical intervention  Primary osteoarthritis involving multiple joints Symptoms well managed with current medications; no limitations at this time  Benign prostatic hyperplasia without lower urinary tract symptoms Symptoms stable on medications; followed by urology  Medication management -     CBC with Differential/Platelet -     BASIC METABOLIC PANEL WITH GFR -     Hepatic function panel  Mixed  hyperlipidemia Very near goal; continue zetia 10 mg daily Continue low cholesterol diet and exercise.  Check lipid panel.  -     Lipid panel  Vitamin D deficiency At goal at recent check; continue to recommend supplementation for goal of 70-100 Defer vitamin D level  Word finding difficulty On aricept; - r/t ongoing dementia  Over 30 minutes of exam, counseling, chart review, and critical decision making was performed  Future Appointments  Date Time Provider Department Center  07/05/2017 10:30 AM Lucky Cowboy, MD [facility] None  08/05/2017  3:00 PM Van Clines, MD [facility] None  01/23/2018  9:00 AM Lucky Cowboy, MD [facility] None     Plan:   During the course of the visit the patient was educated and counseled about appropriate screening and preventive services including:    Pneumococcal vaccine   Influenza vaccine  Prevnar 13  Td vaccine  Screening electrocardiogram  Colorectal cancer screening  Diabetes screening  Glaucoma screening  Nutrition counseling    Subjective:  Gerald [name] is a 83 y.o. male who presents accompanied by his wife for Medicare Annual Wellness Visit and 3 month follow up for HTN, hyperlipidemia, T2DM with CKD, and vitamin D Def. He is recently recovering from a lumbar surgery in January of this year and is working with PT, doing well. He has ongoing mild dementia, currently on aricept for over 6 months though without notable improvement by the wife. He is able to complete ADLs, still drives with supervision, though demonstrates poor short term memory and some difficulty with word recall. Wife reports some intermittent confusion at home.   BMI is Body mass  index is 24.12 kg/m., he has been working on diet and exercise. Wt Readings from Last 3 Encounters:  04/20/17 161 lb (73 kg)  02/07/17 148 lb (67.1 kg)  12/24/16 164 lb 6.4 oz (74.6 kg)   he has a diagnosis of GERD which is currently managed by protonix 40 mg daily  he  reports symptoms is currently well controlled, and denies breakthrough reflux, burning in chest, hoarseness or cough.    His blood pressure has been controlled at home, today their BP is BP: 120/60 He does workout. He denies chest pain, shortness of breath, dizziness.   He is on cholesterol medication (zetia 10 mg daily, simvastatin 40 mg daily) and denies myalgias. His cholesterol is not at goal. The cholesterol last visit was:   Lab Results  Component Value Date   CHOL 168 12/24/2016   HDL 46 12/24/2016   LDLCALC 74 09/21/2016   TRIG 301 (H) 12/24/2016   CHOLHDL 3.7 12/24/2016   He has not been working on diet and exercise for T2 prediabetes, and denies increased appetite, nausea, paresthesia of the feet, polydipsia, polyuria, visual disturbances, vomiting and weight loss. Checks fasting (just over 100-150), checks again after dinner (200 or so). Last A1C in the office was:  Lab Results  Component Value Date   HGBA1C 7.9 (H) 12/24/2016   Last GFR Lab Results  Component Value Date   GFRNONAA 48 (L) 12/24/2016    Patient is on Vitamin D supplement and near goal at recent check:  Lab Results  Component Value Date   VD25OH 66 12/24/2016      Medication Review: Current Outpatient Medications on File Prior to Visit  Medication Sig Dispense Refill  . amLODipine (NORVASC) 10 MG tablet Take 1 tablet every night for BP 90 tablet 1  . aspirin 81 MG tablet Take by mouth daily.     . B-D ULTRAFINE III SHORT PEN 31G X 8 MM MISC USE AS DIRECTED TWICE DAILY 200 each 0  . baclofen (LIORESAL) 10 MG tablet TAKE 1/2 TO 1 TABLET BY MOUTH 2 TO 3 TIMES A DAY AS NEEDED FOR MUSCLE SPASMS 270 tablet 1  . celecoxib (CELEBREX) 200 MG capsule TAKE 1 CAPSULE BY MOUTH DAILY FOR PAIN AND INFLAMMATION 90 capsule 0  . Cholecalciferol (VITAMIN D PO) Take 5,000 Units by mouth daily.    Marland Kitchen. donepezil (ARICEPT) 10 MG tablet Take 1 tablets once daily. 90 tablet 3  . ezetimibe (ZETIA) 10 MG tablet TAKE 1 TABLET BY  MOUTH DAILY 90 tablet 1  . finasteride (PROSCAR) 5 MG tablet TAKE 1 TABLET BY MOUTH DAILY 90 tablet 0  . fish oil-omega-3 fatty acids 1000 MG capsule Take 1 g by mouth daily.     Marland Kitchen. LANTUS SOLOSTAR 100 UNIT/ML Solostar Pen USE AS DIRECTED 45 mL 1  . levothyroxine (SYNTHROID, LEVOTHROID) 150 MCG tablet TAKE 1 TABLET BY MOUTH EVERY MORNING 90 tablet 1  . losartan-hydrochlorothiazide (HYZAAR) 100-25 MG tablet TAKE 1 TABLET BY MOUTH EVERY DAY FOR HIGH BLOOD PRESSURE 90 tablet 1  . LUTEIN PO Take by mouth.    . metFORMIN (GLUCOPHAGE-XR) 500 MG 24 hr tablet TAKE 4 TABLETS BY MOUTH EVERY DAY FOR DIABETES 360 tablet 1  . minocycline (MINOCIN,DYNACIN) 100 MG capsule TAKE 1 CAPSULE BY MOUTH DAILY 90 capsule 1  . Multiple Vitamin (MULTIVITAMIN) tablet Take 1 tablet by mouth daily.    . simvastatin (ZOCOR) 40 MG tablet TAKE 1 TABLET BY MOUTH EVERY NIGHT AT BEDTIME FOR CHOLESTEROL 90  tablet PRN  . gabapentin (NEURONTIN) 300 MG capsule Take 300 mg by mouth 3 (three) times daily.    Marland Kitchen HYDROcodone-acetaminophen (NORCO/VICODIN) 5-325 MG tablet Take 1 tablet by mouth every 6 (six) hours as needed for moderate pain.     No current facility-administered medications on file prior to visit.     Allergies: Allergies  Allergen Reactions  . Ace Inhibitors     Current Problems (verified) has Essential hypertension; Mixed hyperlipidemia; CKD stage 3 due to type 2 diabetes mellitus (HCC); Vitamin D deficiency; Medication management; Hypothyroidism; GERD ; Insulin Dependent T2_IDDM; Benign prostatic hyperplasia; DJD (degenerative joint disease); Encounter for Medicare annual wellness exam; Complete rotator cuff rupture of left shoulder; Thoracic aorta atherosclerosis (HCC); Mild dementia; and Word finding difficulty on their problem list.  Screening Tests Immunization History  Administered Date(s) Administered  . DT 11/01/2014  . Influenza Whole 11/10/2012  . Influenza, High Dose Seasonal PF 12/03/2013, 11/01/2014,  10/16/2015, 12/24/2016  . Pneumococcal Conjugate-13 12/03/2013  . Pneumococcal Polysaccharide-23 07/27/2011  . Tdap 02/23/2004  . Zoster 07/15/2005   Preventative care: Last colonoscopy: 2015, done secondary to age  Prior vaccinations: TD or Tdap: 2006  Influenza: 2018  Pneumococcal: 2013 Prevnar13:2015  Shingles/Zostavax: 2007  Names of Other Physician/Practitioners you currently use: 1. [facility] here for primary care 2. Dr. Emily Filbert, eye doctor, last visit 07/07/2016 - report verified and abstracted 3. Dr. Marland Kitchen  dentist, last visit 2018, due soon  Patient Care Team: Lucky Cowboy, MD as PCP - General (Internal Medicine) Louis Meckel, MD (Inactive) as Consulting Physician (Gastroenterology) Jacqualyn Posey, DC (Chiropractic Medicine) Elise Benne, MD as Consulting Physician (Ophthalmology) Leanord Hawking, MD as Referring Physician (Orthopedic Surgery)  Surgical: He  has a past surgical history that includes RIGHT SHOULDER ARTHROSCOPY/ LABRAL & BICEPS TENDON DEBRIDEMENT/ DISTAL CLAVICLE DECOMPRESSION/ SAD/ MINI OPEN ROTATOR CUFF REPAIR (03-09-2010); Shoulder arthroscopy (04-10-2004); Lumbar microdiscectomy (04-06-1999); Shoulder open rotator cuff repair (06/09/2011); and Lumbar disc surgery (03/15/2017). Family His family history includes Cancer in his father and sister; Diabetes in his maternal grandmother; Heart disease in his father and mother; Hypertension in his sister; Stroke in his father. Social history  He reports that he quit smoking about 26 years ago. His smoking use included cigarettes. He smoked 0.25 packs per day. he has never used smokeless tobacco. He reports that he does not drink alcohol or use drugs.  MEDICARE WELLNESS OBJECTIVES: Physical activity: Current Exercise Habits: Home exercise routine(Home PT; ), Type of exercise: strength training/weights;stretching;walking, Time (Minutes): 30, Frequency (Times/Week): 4, Weekly  Exercise (Minutes/Week): 120, Intensity: Mild, Exercise limited by: neurologic condition(s) Cardiac risk factors: Cardiac Risk Factors include: advanced age (>69men, >68 women);diabetes mellitus;dyslipidemia;hypertension;male gender;smoking/ tobacco exposure Depression/mood screen:   Depression screen [facility] 2/9 04/20/2017  Decreased Interest 0  Down, Depressed, Hopeless 0  PHQ - 2 Score 0    ADLs:  In your present state of health, do you have any difficulty performing the following activities: 04/20/2017 12/24/2016  Hearing? N N  Vision? N N  Difficulty concentrating or making decisions? Y Y  Comment - very poor Short term recall and limited insight   Walking or climbing stairs? N N  Dressing or bathing? N N  Doing errands, shopping? N Y  Comment Drives with supervision wife drives  Some recent data might be hidden     Cognitive Testing  Alert? Yes  Normal Appearance?Yes  Oriented to person? Yes  Place? Yes   Time? Yes  Recall of three  objects?  No 1/3  Can perform simple calculations? Yes  Displays appropriate judgment? Yes  Can read the correct time from a watch face?Yes  EOL planning: Does Patient Have a Medical Advance Directive?: Yes Type of Advance Directive: Healthcare Power of Attorney, Living will Does patient want to make changes to medical advance directive?: No - Patient declined Copy of Healthcare Power of Attorney in Chart?: No - copy requested   Objective:   Today's Vitals   04/20/17 0953  BP: 120/60  Pulse: (!) 53  Temp: 97.9 F (36.6 C)  SpO2: 98%  Weight: 161 lb (73 kg)  Height: 5' 8.5" (1.74 m)   Body mass index is 24.12 kg/m.  General appearance: alert, no distress, WD/WN, male HEENT: normocephalic, sclerae anicteric, TMs pearly, nares patent, no discharge or erythema, pharynx normal Oral cavity: MMM, no lesions Neck: supple, no lymphadenopathy, no thyromegaly, no masses Heart: RRR, normal S1, S2, no murmurs Lungs: CTA bilaterally, no wheezes,  rhonchi, or rales Abdomen: +bs, soft, non tender, non distended, no masses, no hepatomegaly, no splenomegaly Musculoskeletal: nontender, no swelling, no obvious deformity Extremities: no edema, no cyanosis, no clubbing Pulses: 2+ symmetric, upper and lower extremities, normal cap refill Neurological: alert, oriented x 3, CN2-12 intact, strength normal upper extremities and lower extremities, sensation normal throughout, DTRs 2+ throughout, no cerebellar signs, gait slow but steady/normal Psychiatric: normal affect, behavior normal, pleasant, demonstrates poor short term memory and some difficulty with word recall  Medicare Attestation I have personally reviewed: The patient's medical and social history Their use of alcohol, tobacco or illicit drugs Their current medications and supplements The patient's functional ability including ADLs,fall risks, home safety risks, cognitive, and hearing and visual impairment Diet and physical activities Evidence for depression or mood disorders  The patient's weight, height, BMI, and visual acuity have been recorded in the chart.  I have made referrals, counseling, and provided education to the patient based on review of the above and I have provided the patient with a written personalized care plan for preventive services.     Dan Maker, NP   04/20/2017

## 2017-04-20 ENCOUNTER — Ambulatory Visit (INDEPENDENT_AMBULATORY_CARE_PROVIDER_SITE_OTHER): Payer: Medicare Other | Admitting: Adult Health

## 2017-04-20 ENCOUNTER — Encounter: Payer: Self-pay | Admitting: Adult Health

## 2017-04-20 VITALS — BP 120/60 | HR 53 | Temp 97.9°F | Ht 68.5 in | Wt 161.0 lb

## 2017-04-20 DIAGNOSIS — M75122 Complete rotator cuff tear or rupture of left shoulder, not specified as traumatic: Secondary | ICD-10-CM

## 2017-04-20 DIAGNOSIS — R4789 Other speech disturbances: Secondary | ICD-10-CM

## 2017-04-20 DIAGNOSIS — I700 Atherosclerosis of aorta: Secondary | ICD-10-CM

## 2017-04-20 DIAGNOSIS — E1122 Type 2 diabetes mellitus with diabetic chronic kidney disease: Secondary | ICD-10-CM

## 2017-04-20 DIAGNOSIS — Z0001 Encounter for general adult medical examination with abnormal findings: Secondary | ICD-10-CM | POA: Diagnosis not present

## 2017-04-20 DIAGNOSIS — M150 Primary generalized (osteo)arthritis: Secondary | ICD-10-CM

## 2017-04-20 DIAGNOSIS — M48062 Spinal stenosis, lumbar region with neurogenic claudication: Secondary | ICD-10-CM | POA: Diagnosis not present

## 2017-04-20 DIAGNOSIS — F0390 Unspecified dementia without behavioral disturbance: Secondary | ICD-10-CM

## 2017-04-20 DIAGNOSIS — E039 Hypothyroidism, unspecified: Secondary | ICD-10-CM

## 2017-04-20 DIAGNOSIS — E119 Type 2 diabetes mellitus without complications: Secondary | ICD-10-CM | POA: Diagnosis not present

## 2017-04-20 DIAGNOSIS — Z4789 Encounter for other orthopedic aftercare: Secondary | ICD-10-CM | POA: Diagnosis not present

## 2017-04-20 DIAGNOSIS — Z0000 Encounter for general adult medical examination without abnormal findings: Secondary | ICD-10-CM

## 2017-04-20 DIAGNOSIS — R6889 Other general symptoms and signs: Secondary | ICD-10-CM | POA: Diagnosis not present

## 2017-04-20 DIAGNOSIS — M159 Polyosteoarthritis, unspecified: Secondary | ICD-10-CM

## 2017-04-20 DIAGNOSIS — R339 Retention of urine, unspecified: Secondary | ICD-10-CM | POA: Diagnosis not present

## 2017-04-20 DIAGNOSIS — N400 Enlarged prostate without lower urinary tract symptoms: Secondary | ICD-10-CM

## 2017-04-20 DIAGNOSIS — K219 Gastro-esophageal reflux disease without esophagitis: Secondary | ICD-10-CM | POA: Diagnosis not present

## 2017-04-20 DIAGNOSIS — N183 Chronic kidney disease, stage 3 unspecified: Secondary | ICD-10-CM

## 2017-04-20 DIAGNOSIS — Z79899 Other long term (current) drug therapy: Secondary | ICD-10-CM | POA: Diagnosis not present

## 2017-04-20 DIAGNOSIS — F03A0 Unspecified dementia, mild, without behavioral disturbance, psychotic disturbance, mood disturbance, and anxiety: Secondary | ICD-10-CM

## 2017-04-20 DIAGNOSIS — Z794 Long term (current) use of insulin: Secondary | ICD-10-CM

## 2017-04-20 DIAGNOSIS — E559 Vitamin D deficiency, unspecified: Secondary | ICD-10-CM

## 2017-04-20 DIAGNOSIS — E782 Mixed hyperlipidemia: Secondary | ICD-10-CM | POA: Diagnosis not present

## 2017-04-20 DIAGNOSIS — I10 Essential (primary) hypertension: Secondary | ICD-10-CM | POA: Diagnosis not present

## 2017-04-20 MED ORDER — ATENOLOL 50 MG PO TABS: 50.0000 mg | ORAL_TABLET | Freq: Every day | ORAL | 1 refills | Status: AC

## 2017-04-20 MED ORDER — RANITIDINE HCL 150 MG PO TABS: 150.0000 mg | ORAL_TABLET | Freq: Two times a day (BID) | ORAL | 1 refills | Status: AC

## 2017-04-20 NOTE — Patient Instructions (Addendum)
Here is some information to help you keep your heart healthy: Move it! - Aim for 30 mins of activity every day. Take it slowly at first. Talk to us before starting any new exercise program.   Lose it.  -Body Mass Index (BMI) can indicate if you need to lose weight. A healthy range is 18.5-24.9. For a BMI calculator, go to [employer]  Waist Management -Excess abdominal fat is a risk factor for heart disease, diabetes, asthma, stroke and more. Ideal waist circumference is less than 35" for women and less than 40" for men.   Eat Right -focus on fruits, vegetables, whole grains, and meals you make yourself. Avoid foods with trans fat and high sugar/sodium content.   Snooze or Snore? - Loud snoring can be a sign of sleep apnea, a significant risk factor for high blood pressure, heart attach, stroke, and heart arrhythmias.  Kick the habit -Quit Smoking! Avoid second hand smoke. A single cigarette raises your blood pressure for 20 mins and increases the risk of heart attack and stroke for the next 24 hours.   Are Aspirin and Supplements right for you? -Add ENTERIC COATED low dose 81 mg Aspirin daily OR can do every other day if you have easy bruising to protect your heart and head. As well as to reduce risk of Colon Cancer by 20 %, Skin Cancer by 26 % , Melanoma by 46% and Pancreatic cancer by 60%  Say "No to Stress -There may be little you can do about problems that cause stress. However, techniques such as long walks, meditation, and exercise can help you manage it.   Start Now! - Make changes one at a time and set reasonable goals to increase your likelihood of success.      Dementia Caregiver Guide Dementia is a term used to describe a number of symptoms that affect memory and thinking. The most common symptoms include:  Memory loss.  Trouble with language and communication.  Trouble concentrating.  Poor judgment.  Problems with reasoning.  Child-like behavior and  language.  Extreme anxiety.  Angry outbursts.  Wandering from home or public places.  Dementia usually gets worse slowly over time. In the early stages, people with dementia can stay independent and safe with some help. In later stages, they need help with daily tasks such as dressing, grooming, and using the bathroom. How to help the person with dementia cope Dementia can be frightening and confusing. Here are some tips to help the person with dementia cope with changes caused by the disease. General tips  Keep the person on track with his or her routine.  Try to identify areas where the person may need help.  Be supportive, patient, calm, and encouraging.  Gently remind the person that adjusting to changes takes time.  Help with the tasks that the person has asked for help with.  Keep the person involved in daily tasks and decisions as much as possible.  Encourage conversation, but try not to get frustrated or harried if the person struggles to find words or does not seem to appreciate your help. Communication tips  When the person is talking or seems frustrated, make eye contact and hold the person's hand.  Ask specific questions that need yes or no answers.  Use simple words, short sentences, and a calm voice. Only give one direction at a time.  When offering choices, limit them to just 1 or 2.  Avoid correcting the person in a negative way.  If the  person is struggling to find the right words, gently try to help him or her. How to recognize symptoms of stress Symptoms of stress in caregivers include:  Feeling frustrated or angry with the person with dementia.  Denying that the person has dementia or that his or her symptoms will not improve.  Feeling hopeless and unappreciated.  Difficulty sleeping.  Difficulty concentrating.  Feeling anxious, irritable, or depressed.  Developing stress-related health problems.  Feeling like you have too little time for  your own life.  Follow these instructions at home:  Make sure that you and the person you are caring for: ? Get regular sleep. ? Exercise regularly. ? Eat regular, nutritious meals. ? Drink enough fluid to keep your urine clear or pale yellow. ? Take over-the-counter and prescription medicines only as told by your health care providers. ? Attend all scheduled health care appointments.  Join a support group with others who are caregivers.  Ask about respite care resources so that you can have a regular break from the stress of caregiving.  Look for signs of stress in yourself and in the person you are caring for. If you notice signs of stress, take steps to manage it.  Consider any safety risks and take steps to avoid them.  Organize medications in a pill box for each day of the week.  Create a plan to handle any legal or financial matters. Get legal or financial advice if needed.  Keep a calendar in a central location to remind the person of appointments or other activities. Tips for reducing the risk of injury  Keep floors clear of clutter. Remove rugs, magazine racks, and floor lamps.  Keep hallways well lit, especially at night.  Put a handrail and nonslip mat in the bathtub or shower.  Put childproof locks on cabinets that contain dangerous items, such as medicines, alcohol, guns, toxic cleaning items, sharp tools or utensils, matches, and lighters.  Put the locks in places where the person cannot see or reach them easily. This will help ensure that the person does not wander out of the house and get lost.  Be prepared for emergencies. Keep a list of emergency phone numbers and addresses in a convenient area.  Remove car keys and lock garage doors so that the person does not try to get in the car and drive.  Have the person wear a bracelet that tracks locations and identifies the person as having memory problems. This should be worn at all times for safety. Where to find  support: Many individuals and organizations offer support. These include:  Support groups for people with dementia and for caregivers.  Counselors or therapists.  Home health care services.  Adult day care centers.  Where to find more information: Alzheimer's Association: [employer] Contact a health care provider if:  The person's health is rapidly getting worse.  You are no longer able to care for the person.  Caring for the person is affecting your physical and emotional health.  The person threatens himself or herself, you, or anyone else. Summary  Dementia is a term used to describe a number of symptoms that affect memory and thinking.  Dementia usually gets worse slowly over time.  Take steps to reduce the person's risk of injury, and to plan for future care.  Caregivers need support, relief from caregiving, and time for their own lives. This information is not intended to replace advice given to you by your health care provider. Make sure you discuss any  questions you have with your health care provider. Document Released: 01/13/2016 Document Revised: 01/13/2016 Document Reviewed: 01/13/2016 Elsevier Interactive Patient Education  [employer].

## 2017-04-21 LAB — CBC WITH DIFFERENTIAL/PLATELET
BASOS PCT: 1.1 %
Basophils Absolute: 61 cells/uL (ref 0–200)
Eosinophils Absolute: 209 cells/uL (ref 15–500)
Eosinophils Relative: 3.8 %
HEMATOCRIT: 39.6 % (ref 38.5–50.0)
Hemoglobin: 13.9 g/dL (ref 13.2–17.1)
LYMPHS ABS: 1419 {cells}/uL (ref 850–3900)
MCH: 31 pg (ref 27.0–33.0)
MCHC: 35.1 g/dL (ref 32.0–36.0)
MCV: 88.4 fL (ref 80.0–100.0)
MPV: 9 fL (ref 7.5–12.5)
Monocytes Relative: 9.3 %
NEUTROS PCT: 60 %
Neutro Abs: 3300 cells/uL (ref 1500–7800)
Platelets: 178 10*3/uL (ref 140–400)
RBC: 4.48 10*6/uL (ref 4.20–5.80)
RDW: 13 % (ref 11.0–15.0)
Total Lymphocyte: 25.8 %
WBC: 5.5 10*3/uL (ref 3.8–10.8)
WBCMIX: 512 {cells}/uL (ref 200–950)

## 2017-04-21 LAB — BASIC METABOLIC PANEL WITH GFR
BUN/Creatinine Ratio: 24 (calc) — ABNORMAL HIGH (ref 6–22)
BUN: 26 mg/dL — AB (ref 7–25)
CALCIUM: 9.6 mg/dL (ref 8.6–10.3)
CO2: 26 mmol/L (ref 20–32)
CREATININE: 1.08 mg/dL (ref 0.70–1.11)
Chloride: 102 mmol/L (ref 98–110)
GFR, EST NON AFRICAN AMERICAN: 64 mL/min/{1.73_m2} (ref >=60)
GFR, Est African American: 75 mL/min/{1.73_m2} (ref >=60)
GLUCOSE: 239 mg/dL — AB (ref 65–99)
Potassium: 4.4 mmol/L (ref 3.5–5.3)
SODIUM: 138 mmol/L (ref 135–146)

## 2017-04-21 LAB — HEPATIC FUNCTION PANEL
AG RATIO: 2 (calc) (ref 1.0–2.5)
ALBUMIN MSPROF: 4.3 g/dL (ref 3.6–5.1)
ALKALINE PHOSPHATASE (APISO): 58 U/L (ref 40–115)
ALT: 30 U/L (ref 9–46)
AST: 28 U/L (ref 10–35)
Bilirubin, Direct: 0.1 mg/dL (ref 0.0–0.2)
GLOBULIN: 2.1 g/dL (ref 1.9–3.7)
Indirect Bilirubin: 0.5 mg/dL (calc) (ref 0.2–1.2)
TOTAL PROTEIN: 6.4 g/dL (ref 6.1–8.1)
Total Bilirubin: 0.6 mg/dL (ref 0.2–1.2)

## 2017-04-21 LAB — LIPID PANEL
Cholesterol: 145 mg/dL (ref <200)
HDL: 40 mg/dL — AB (ref >40)
LDL CHOLESTEROL (CALC): 70 mg/dL
NON-HDL CHOLESTEROL (CALC): 105 mg/dL (ref <130)
TRIGLYCERIDES: 265 mg/dL — AB (ref <150)
Total CHOL/HDL Ratio: 3.6 (calc) (ref <5.0)

## 2017-04-21 LAB — HEMOGLOBIN A1C
Hgb A1c MFr Bld: 8.5 % of total Hgb — ABNORMAL HIGH (ref <5.7)
Mean Plasma Glucose: 197 (calc)
eAG (mmol/L): 10.9 (calc)

## 2017-04-21 LAB — TSH: TSH: 0.36 mIU/L — ABNORMAL LOW (ref 0.40–4.50)

## 2017-04-21 LAB — MAGNESIUM: MAGNESIUM: 1.8 mg/dL (ref 1.5–2.5)

## 2017-04-28 ENCOUNTER — Other Ambulatory Visit: Payer: Self-pay | Admitting: Internal Medicine

## 2017-04-28 ENCOUNTER — Other Ambulatory Visit: Payer: Self-pay | Admitting: Physician Assistant

## 2017-05-19 ENCOUNTER — Other Ambulatory Visit: Payer: Self-pay | Admitting: Internal Medicine

## 2017-05-23 DIAGNOSIS — E119 Type 2 diabetes mellitus without complications: Secondary | ICD-10-CM | POA: Diagnosis not present

## 2017-05-23 DIAGNOSIS — H2513 Age-related nuclear cataract, bilateral: Secondary | ICD-10-CM | POA: Diagnosis not present

## 2017-05-23 LAB — HM DIABETES EYE EXAM

## 2017-05-23 NOTE — Progress Notes (Signed)
Assessment and Plan:  Gerald [name] was seen today for altered mental status.  Diagnoses and all orders for this visit:  Altered mental status, unspecified altered mental status type -     Urinalysis w microscopic + reflex cultur -     TSH -     CBC with Differential/Platelet -     BASIC METABOLIC PANEL WITH GFR -     Hepatic function panel  Dementia associated with behavioral disturbance (HCC) After r/o ?thyroid changes or infection, may start medication for agitation while pending follow up with neurology- Discussed with Dr. Oneta Rack and will recommend low dose Seroquel.  Discussed risks and benefits associated with this medication with the wife, advised possible increased risk of death. Instructed to start very slowly and evaluate need and tolerance regularly and will taper to d/c as soon as possible -     QUEtiapine (SEROQUEL) 25 MG tablet; Take 1/2-1 tab twice a day as needed for agitation. Follow up in 4 weeks or sooner as needed -   Further disposition pending results of labs. Discussed med's effects and SE's.   Over 15 minutes of exam, counseling, chart review, and critical decision making was performed.   Future Appointments  Date Time Provider Department Center  08/05/2017  3:00 PM Van Clines, MD [facility] None  08/19/2017 11:00 AM Lucky Cowboy, MD [facility] None  01/23/2018  9:00 AM Lucky Cowboy, MD [facility] None  04/24/2018 10:45 AM Judd Gaudier, NP [facility] None    ------------------------------------------------------------------------------------------------------------------  HPI BP 122/64   Pulse (!) 54   Temp (!) 97.3 F (36.3 C)   Ht 5' 8.5" (1.74 m)   Wt 162 lb 12.8 oz (73.8 kg)   SpO2 96%   BMI 24.39 kg/m   83 y.o.male with hx of T2DM, BPH and mild dementia on aricept presents accompanied by his wife for evaluation of acute memory and awareness changes ongoing for over a month now. He is accompanied by his wife who presents with a written  timeline: 03/15/2017 - had surgery on his back (doing well, denies pain), 03/29/2017 - came home and was still doing very well, 04/08/2017 - was very quiet, sat still and didn't say anything all day, next day started acting paranoid - watching for people outside of the windows, argumentative and angry, occasionally hitting the table or wall with his fist - she reports this has continued about the same for the past month. He is also up and down all night, and wife has been unable to rest adequately. She reports incontinence is worse in this time as well. He is followed by Dr. Patrcia Dolly for previously mild dementia and has appointment scheduled to evaluate this sudden decline next month.   He presents today smiling, very labile in mood/emotion, alert and oriented x 1 for his name, but otherwise unable to answer orientation questions. He denies any pain/discomfort or concerns. He smiles and laughs inappropriately and shows easy agitation though easily redirected and doesn't lash out today in office. He is able to answer some questions and intermittently follows commands but also confabulates.   On review of labs obtained on 2/26 demonstrates normal CBC, renal functions stable improved without electrolyte abnormalities, did demonstrate TSH mildly low at 0.36 - he is on synthroid 137 mcg - takes 1/2 tab on Sat, Sun, Tues, Thur and take full tab M,W,F.   Past Medical History:  Diagnosis Date  . BPH (benign prostatic hypertrophy)   . Diabetes mellitus without complication (HCC)   .  GERD (gastroesophageal reflux disease)   . Hyperlipidemia   . Hypertension   . Rotator cuff tear, right RECURRENT  . Vitamin D deficiency      Allergies  Allergen Reactions  . Ace Inhibitors     Current Outpatient Medications on File Prior to Visit  Medication Sig  . amLODipine (NORVASC) 10 MG tablet Take 1 tablet every night for BP  . aspirin 81 MG tablet Take by mouth daily.   Marland Kitchen. atenolol (TENORMIN) 50 MG tablet Take 1  tablet (50 mg total) by mouth daily.  . B-D ULTRAFINE III SHORT PEN 31G X 8 MM MISC USE AS DIRECTED TWICE DAILY  . baclofen (LIORESAL) 10 MG tablet TAKE 1/2 TO 1 TABLET BY MOUTH 2 TO 3 TIMES A DAY AS NEEDED FOR MUSCLE SPASMS  . celecoxib (CELEBREX) 200 MG capsule TAKE 1 CAPSULE BY MOUTH DAILY FOR PAIN AND INFLAMMATION  . Cholecalciferol (VITAMIN D PO) Take 5,000 Units by mouth daily.  Marland Kitchen. donepezil (ARICEPT) 10 MG tablet Take 1 tablets once daily.  Marland Kitchen. ezetimibe (ZETIA) 10 MG tablet TAKE 1 TABLET BY MOUTH DAILY  . finasteride (PROSCAR) 5 MG tablet TAKE 1 TABLET BY MOUTH DAILY  . fish oil-omega-3 fatty acids 1000 MG capsule Take 1 g by mouth daily.   Marland Kitchen. LANTUS SOLOSTAR 100 UNIT/ML Solostar Pen USE AS DIRECTED  . levothyroxine (SYNTHROID, LEVOTHROID) 150 MCG tablet TAKE 1 TABLET BY MOUTH EVERY MORNING  . losartan-hydrochlorothiazide (HYZAAR) 100-25 MG tablet TAKE 1 TABLET BY MOUTH EVERY DAY FOR HIGH BLOOD PRESSURE  . LUTEIN PO Take by mouth.  . metFORMIN (GLUCOPHAGE-XR) 500 MG 24 hr tablet TAKE 4 TABLETS BY MOUTH EVERY DAY FOR DIABETES  . minocycline (MINOCIN,DYNACIN) 100 MG capsule TAKE 1 CAPSULE BY MOUTH DAILY  . Multiple Vitamin (MULTIVITAMIN) tablet Take 1 tablet by mouth daily.  . ranitidine (ZANTAC) 150 MG tablet Take 1 tablet (150 mg total) by mouth 2 (two) times daily.  . simvastatin (ZOCOR) 40 MG tablet TAKE 1 TABLET BY MOUTH EVERY NIGHT AT BEDTIME FOR CHOLESTEROL  . gabapentin (NEURONTIN) 300 MG capsule Take 300 mg by mouth 3 (three) times daily.  Marland Kitchen. HYDROcodone-acetaminophen (NORCO/VICODIN) 5-325 MG tablet Take 1 tablet by mouth every 6 (six) hours as needed for moderate pain.   No current facility-administered medications on file prior to visit.     ROS: Review of Systems  Constitutional: Negative for chills, diaphoresis, fever and malaise/fatigue.  HENT: Negative.  Negative for congestion and sore throat.   Eyes: Negative.   Respiratory: Negative for cough, sputum production and  wheezing.   Cardiovascular: Negative for chest pain, palpitations, orthopnea, claudication, leg swelling and PND.  Gastrointestinal: Negative for abdominal pain, blood in stool, constipation, diarrhea, nausea and vomiting.  Genitourinary: Negative for dysuria, flank pain, frequency, hematuria and urgency.       Has worse incontinence per wife  Skin: Negative for rash.  Neurological: Negative for dizziness, tingling, tremors, sensory change, speech change, focal weakness, seizures, loss of consciousness, weakness and headaches.  Psychiatric/Behavioral: Positive for memory loss. Negative for hallucinations and substance abuse. The patient has insomnia. The patient is not nervous/anxious.      Physical Exam:  BP 122/64   Pulse (!) 54   Temp (!) 97.3 F (36.3 C)   Ht 5' 8.5" (1.74 m)   Wt 162 lb 12.8 oz (73.8 kg)   SpO2 96%   BMI 24.39 kg/m   General Appearance: Well nourished, in no apparent distress. Eyes: PERRLA, EOMs, conjunctiva no  swelling or erythema Sinuses: No Frontal/maxillary tenderness ENT/Mouth: Ext aud canals clear, TMs without erythema, bulging. No erythema, swelling, or exudate on post pharynx.  Tonsils not swollen or erythematous. Hearing normal.  Neck: Supple, thyroid normal.  Respiratory: Respiratory effort normal, BS equal bilaterally without rales, rhonchi, wheezing or stridor.  Cardio: RRR with no MRGs. Brisk peripheral pulses without edema.  Abdomen: Soft, + BS.  Non tender, no guarding, rebound, hernias, masses. Lymphatics: Non tender without lymphadenopathy.  Musculoskeletal: Symmetrical strength, slow gait.  Skin: Warm, dry without rashes, lesions, ecchymosis.  Neuro: Cranial nerves intact. Normal muscle tone, no cerebellar symptoms. Sensation intact.  Psych: Awake and oriented X 1, labile affect, laughs inappropriately, Insight and Judgment poor, cannot answer questions appropriately the majority of the time, wanders in hall without supervision.     Dan Maker, NP 3:18 PM [facility]

## 2017-05-24 ENCOUNTER — Encounter: Payer: Self-pay | Admitting: Adult Health

## 2017-05-24 ENCOUNTER — Ambulatory Visit (INDEPENDENT_AMBULATORY_CARE_PROVIDER_SITE_OTHER): Payer: Medicare Other | Admitting: Adult Health

## 2017-05-24 VITALS — BP 122/64 | HR 54 | Temp 97.3°F | Ht 68.5 in | Wt 162.8 lb

## 2017-05-24 DIAGNOSIS — R4182 Altered mental status, unspecified: Secondary | ICD-10-CM | POA: Diagnosis not present

## 2017-05-24 DIAGNOSIS — R451 Restlessness and agitation: Secondary | ICD-10-CM | POA: Diagnosis not present

## 2017-05-24 DIAGNOSIS — F0391 Unspecified dementia with behavioral disturbance: Secondary | ICD-10-CM | POA: Diagnosis not present

## 2017-05-24 MED ORDER — QUETIAPINE FUMARATE 25 MG PO TABS: ORAL_TABLET | ORAL | 1 refills | Status: DC

## 2017-05-24 NOTE — Patient Instructions (Addendum)
Seroquel 25 mg strength: Take 1/2 pill each night for 3 days, then 1 pill each night for  3 days, then can add on 1/2 -1 tab in the morning as needed/tolerated for agitation. Common side effects include somnolence (excessive sleepiness), dry mouth, constipation, dizziness, increased appetite, dyspepsia, weight gain, fatigue. Rare side effects include dysarthria (difficulty talking, i.e. slurring of speech), nasal congestion, confusion, personality changes, balance problems.    Dementia Caregiver Guide Dementia is a term used to describe a number of symptoms that affect memory and thinking. The most common symptoms include:  Memory loss.  Trouble with language and communication.  Trouble concentrating.  Poor judgment.  Problems with reasoning.  Child-like behavior and language.  Extreme anxiety.  Angry outbursts.  Wandering from home or public places.  Dementia usually gets worse slowly over time. In the early stages, people with dementia can stay independent and safe with some help. In later stages, they need help with daily tasks such as dressing, grooming, and using the bathroom. How to help the person with dementia cope Dementia can be frightening and confusing. Here are some tips to help the person with dementia cope with changes caused by the disease. General tips  Keep the person on track with his or her routine.  Try to identify areas where the person may need help.  Be supportive, patient, calm, and encouraging.  Gently remind the person that adjusting to changes takes time.  Help with the tasks that the person has asked for help with.  Keep the person involved in daily tasks and decisions as much as possible.  Encourage conversation, but try not to get frustrated or harried if the person struggles to find words or does not seem to appreciate your help. Communication tips  When the person is talking or seems frustrated, make eye contact and hold the person's  hand.  Ask specific questions that need yes or no answers.  Use simple words, short sentences, and a calm voice. Only give one direction at a time.  When offering choices, limit them to just 1 or 2.  Avoid correcting the person in a negative way.  If the person is struggling to find the right words, gently try to help him or her. How to recognize symptoms of stress Symptoms of stress in caregivers include:  Feeling frustrated or angry with the person with dementia.  Denying that the person has dementia or that his or her symptoms will not improve.  Feeling hopeless and unappreciated.  Difficulty sleeping.  Difficulty concentrating.  Feeling anxious, irritable, or depressed.  Developing stress-related health problems.  Feeling like you have too little time for your own life.  Follow these instructions at home:  Make sure that you and the person you are caring for: ? Get regular sleep. ? Exercise regularly. ? Eat regular, nutritious meals. ? Drink enough fluid to keep your urine clear or pale yellow. ? Take over-the-counter and prescription medicines only as told by your health care providers. ? Attend all scheduled health care appointments.  Join a support group with others who are caregivers.  Ask about respite care resources so that you can have a regular break from the stress of caregiving.  Look for signs of stress in yourself and in the person you are caring for. If you notice signs of stress, take steps to manage it.  Consider any safety risks and take steps to avoid them.  Organize medications in a pill box for each day of the week.  Create a plan to handle any legal or financial matters. Get legal or financial advice if needed.  Keep a calendar in a central location to remind the person of appointments or other activities. Tips for reducing the risk of injury  Keep floors clear of clutter. Remove rugs, magazine racks, and floor lamps.  Keep hallways  well lit, especially at night.  Put a handrail and nonslip mat in the bathtub or shower.  Put childproof locks on cabinets that contain dangerous items, such as medicines, alcohol, guns, toxic cleaning items, sharp tools or utensils, matches, and lighters.  Put the locks in places where the person cannot see or reach them easily. This will help ensure that the person does not wander out of the house and get lost.  Be prepared for emergencies. Keep a list of emergency phone numbers and addresses in a convenient area.  Remove car keys and lock garage doors so that the person does not try to get in the car and drive.  Have the person wear a bracelet that tracks locations and identifies the person as having memory problems. This should be worn at all times for safety. Where to find support: Many individuals and organizations offer support. These include:  Support groups for people with dementia and for caregivers.  Counselors or therapists.  Home health care services.  Adult day care centers.  Where to find more information: Alzheimer[employer] Association: [employer] Contact a health care provider if:  The person's health is rapidly getting worse.  You are no longer able to care for the person.  Caring for the person is affecting your physical and emotional health.  The person threatens himself or herself, you, or anyone else. Summary  Dementia is a term used to describe a number of symptoms that affect memory and thinking.  Dementia usually gets worse slowly over time.  Take steps to reduce the person's risk of injury, and to plan for future care.  Caregivers need support, relief from caregiving, and time for their own lives. This information is not intended to replace advice given to you by your health care provider. Make sure you discuss any questions you have with your health care provider. Document Released: 01/13/2016 Document Revised: 01/13/2016 Document Reviewed:  01/13/2016 Elsevier Interactive Patient Education  [employer].

## 2017-05-25 LAB — HEPATIC FUNCTION PANEL
AG RATIO: 2 (calc) (ref 1.0–2.5)
ALKALINE PHOSPHATASE (APISO): 82 U/L (ref 40–115)
ALT: 28 U/L (ref 9–46)
AST: 27 U/L (ref 10–35)
Albumin: 4.4 g/dL (ref 3.6–5.1)
BILIRUBIN INDIRECT: 0.3 mg/dL (ref 0.2–1.2)
Bilirubin, Direct: 0.1 mg/dL (ref 0.0–0.2)
Globulin: 2.2 g/dL (calc) (ref 1.9–3.7)
TOTAL PROTEIN: 6.6 g/dL (ref 6.1–8.1)
Total Bilirubin: 0.4 mg/dL (ref 0.2–1.2)

## 2017-05-25 LAB — CBC WITH DIFFERENTIAL/PLATELET
BASOS ABS: 80 {cells}/uL (ref 0–200)
Basophils Relative: 1 %
EOS ABS: 208 {cells}/uL (ref 15–500)
Eosinophils Relative: 2.6 %
HCT: 40.6 % (ref 38.5–50.0)
Hemoglobin: 14.3 g/dL (ref 13.2–17.1)
Lymphs Abs: 2160 cells/uL (ref 850–3900)
MCH: 31.3 pg (ref 27.0–33.0)
MCHC: 35.2 g/dL (ref 32.0–36.0)
MCV: 88.8 fL (ref 80.0–100.0)
MONOS PCT: 7.4 %
MPV: 9.3 fL (ref 7.5–12.5)
NEUTROS PCT: 62 %
Neutro Abs: 4960 cells/uL (ref 1500–7800)
Platelets: 186 10*3/uL (ref 140–400)
RBC: 4.57 10*6/uL (ref 4.20–5.80)
RDW: 13 % (ref 11.0–15.0)
Total Lymphocyte: 27 %
WBC mixed population: 592 cells/uL (ref 200–950)
WBC: 8 10*3/uL (ref 3.8–10.8)

## 2017-05-25 LAB — BASIC METABOLIC PANEL WITH GFR
BUN: 25 mg/dL (ref 7–25)
CHLORIDE: 104 mmol/L (ref 98–110)
CO2: 25 mmol/L (ref 20–32)
Calcium: 9.4 mg/dL (ref 8.6–10.3)
Creat: 1.1 mg/dL (ref 0.70–1.11)
GFR, Est African American: 73 mL/min/{1.73_m2} (ref >=60)
GFR, Est Non African American: 63 mL/min/{1.73_m2} (ref >=60)
GLUCOSE: 306 mg/dL — AB (ref 65–99)
Potassium: 4.6 mmol/L (ref 3.5–5.3)
SODIUM: 138 mmol/L (ref 135–146)

## 2017-05-25 LAB — URINALYSIS W MICROSCOPIC + REFLEX CULTURE
BILIRUBIN URINE: NEGATIVE
Bacteria, UA: NONE SEEN /HPF
Hgb urine dipstick: NEGATIVE
Hyaline Cast: NONE SEEN /LPF
Ketones, ur: NEGATIVE
Leukocyte Esterase: NEGATIVE
NITRITES URINE, INITIAL: NEGATIVE
Protein, ur: NEGATIVE
RBC / HPF: NONE SEEN /HPF (ref 0–2)
Specific Gravity, Urine: 1.031 (ref 1.001–1.03)
Squamous Epithelial / LPF: NONE SEEN /HPF (ref <=5)
WBC, UA: NONE SEEN /HPF (ref 0–5)

## 2017-05-25 LAB — NO CULTURE INDICATED

## 2017-05-25 LAB — TSH: TSH: 0.47 mIU/L (ref 0.40–4.50)

## 2017-06-03 ENCOUNTER — Encounter: Payer: Self-pay | Admitting: *Deleted

## 2017-07-05 ENCOUNTER — Ambulatory Visit: Payer: Self-pay | Admitting: Internal Medicine

## 2017-07-05 ENCOUNTER — Other Ambulatory Visit: Payer: Self-pay | Admitting: Internal Medicine

## 2017-07-05 ENCOUNTER — Encounter: Payer: Self-pay | Admitting: Neurology

## 2017-07-05 ENCOUNTER — Other Ambulatory Visit: Payer: Self-pay

## 2017-07-05 ENCOUNTER — Ambulatory Visit (INDEPENDENT_AMBULATORY_CARE_PROVIDER_SITE_OTHER): Payer: Medicare Other | Admitting: Neurology

## 2017-07-05 VITALS — BP 142/64 | HR 52 | Ht 69.0 in | Wt 161.0 lb

## 2017-07-05 DIAGNOSIS — R4789 Other speech disturbances: Secondary | ICD-10-CM

## 2017-07-05 DIAGNOSIS — R451 Restlessness and agitation: Secondary | ICD-10-CM

## 2017-07-05 DIAGNOSIS — F0391 Unspecified dementia with behavioral disturbance: Secondary | ICD-10-CM | POA: Diagnosis not present

## 2017-07-05 DIAGNOSIS — F03B18 Unspecified dementia, moderate, with other behavioral disturbance: Secondary | ICD-10-CM

## 2017-07-05 MED ORDER — DONEPEZIL HCL 10 MG PO TABS: ORAL_TABLET | ORAL | 3 refills | Status: AC

## 2017-07-05 MED ORDER — QUETIAPINE FUMARATE 25 MG PO TABS: ORAL_TABLET | ORAL | 3 refills | Status: DC

## 2017-07-05 NOTE — Progress Notes (Signed)
NEUROLOGY FOLLOW UP OFFICE NOTE  NORVAL SLAVEN [id number] 1934/07/22  HISTORY OF PRESENT ILLNESS: I had the pleasure of seeing Garvin Ellena in follow-up in the neurology clinic on 07/05/2017.  The patient was last seen 6 months ago for mild dementia and is again accompanied by his wife who supplements the history today.  MMSE 24/30 in June 2018. B12 level normal. MRI brain showed a small area of cortical encephalomalacia in the left anterior superior frontal gyrus and left middle temporal gyrus; moderate chronic microvascular disease, more pronounced on the left hemisphere, as well as scattered bilateral gyriform hemosiderin affecting mostly the anterior and superior frontal lobes, left occipital and lateral left temporal lobe, with diagnosis of superficial siderosis, sequelae of prior intracranial hemorrhage. Memory and speech difficulties likely of vascular etiology (vascular dementia). He was mostly reporting word-finding difficulties. He presents today with family reporting significant worsening. He had back surgery in January and did really well with no further back pain. His wife reports that he was doing well when discharged home on 03/29/17, however on 04/08/17 he started acting different, he was very quiet and did not say anything all day, then the next day was paranoid, watching for people outside the windows, argumentative and angry, occasionally hitting the table or wall with his fist. His wife reports she thinks he has auditory hallucinations, he hears people and has gotten up a few times at night and one time got dressed to get the people. One time she went under the house with him to show him there was no one there. Another time he was up at 3am with all the lights on, emptying the trash with the garage door open. He was prescribed Seroquel by his PCP but his wife reports she never got the prescription. Symptoms are unchanged. He is very pleasant in the office today and denies any problems  except for left rib pain that started after he tripped and fell last Friday. He showers and dresses himself but needs help picking out clothes. He mostly just likes to sit and watch TV, she has tried to get him out daily, but he has fallen twice when they would go out for walks, he fell the other day and daughter helped him up.   Diagnostic Data: EKG 12/2016 showed a QTc of 384.  HPI 08/02/2016: This is an 83 yo RH man with a history of hypertension, hyperlipidemia, diabetes, hypothyroidism, who presented for evaluation of memory loss with word-finding difficulties. He feels his memory is good except for "not knowing things that have happened." His wife started noticing memory changes for around 5 months, he would have difficulty trying to say a sentence, stopping and trying to grasp for the next word. Recently he forgets names of his grandchildren. He forgets where he puts things. He has not been driving as much after getting turned around 2 months ago with his wife in the car with him. 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 He fills out his own pillbox and states he takes his medications okay. He states he is here because he could not answer questions at Dr. Kathryne Sharper office one time. His wife denies any personality changes, no hallucinations. He is independent with ADLs. His last fall was 3-4 months ago.  He has had anosmia for at least 60 years. He has chronic back pain. He denies any headaches, dizziness, diplopia, dysarthria, dysphagia, neck  pain, focal numbness/tingling/weakness, bowel/bladder dysfunction. No tremors, no falls. He denies any significant head injuries, no alcohol use. No family history of dementia.  I personally reviewed MRI brain without contrast done 06/23/2016 which did not show any acute changes. There was a small area of cortical encephalomalacia in the left anterior superior frontal gyrus and  left middle temporal gyrus. There was moderate chronic microvascular disease, more pronounced on the left hemisphere. There was also scattered bilateral gyriform hemosiderin affecting mostly the anterior and superior frontal lobes, left occipital and lateral left temporal lobe, with diagnosis of superficial siderosis, sequelae of prior intracranial hemorrhage.  PAST MEDICAL HISTORY: Past Medical History:  Diagnosis Date  . BPH (benign prostatic hypertrophy)   . Diabetes mellitus without complication (HCC)   . GERD (gastroesophageal reflux disease)   . Hyperlipidemia   . Hypertension   . Rotator cuff tear, right RECURRENT  . Vitamin D deficiency     MEDICATIONS: Current Outpatient Medications on File Prior to Visit  Medication Sig Dispense Refill  . amLODipine (NORVASC) 10 MG tablet Take 1 tablet every night for BP 90 tablet 1  . aspirin 81 MG tablet Take by mouth daily.     Marland Kitchen atenolol (TENORMIN) 100 MG tablet Take 1 tablet daily for BP 90 tablet 3  . atenolol (TENORMIN) 50 MG tablet TAKE 2 TABLETS BY MOUTH EVERY DAY 180 tablet 1  . B-D ULTRAFINE III SHORT PEN 31G X 8 MM MISC USE UTD BID  1  . baclofen (LIORESAL) 10 MG tablet TAKE 1/2 TO 1 TABLET BY MOUTH 2 TO 3 TIMES A DAY AS NEEDED FOR MUSCLE SPASMS 270 tablet 1  . celecoxib (CELEBREX) 200 MG capsule TAKE 1 CAPSULE BY MOUTH DAILY FOR PAIN AND INFLAMMATION 90 capsule 0  . Cholecalciferol (VITAMIN D PO) Take 5,000 Units by mouth daily.    Marland Kitchen donepezil (ARICEPT) 10 MG tablet Take 1/2 tablet once daily for one month, Then take 1 tablets once daily. (Patient taking 1 tablet daily) 30 tablet 6  . ezetimibe (ZETIA) 10 MG tablet TAKE 1 TABLET BY MOUTH DAILY 90 tablet 1  . finasteride (PROSCAR) 5 MG tablet TAKE 1 TABLET BY MOUTH DAILY 90 tablet 0  . fish oil-omega-3 fatty acids 1000 MG capsule Take 1 g by mouth daily.     Marland Kitchen gabapentin (NEURONTIN) 300 MG capsule Take 300 mg by mouth 3 (three) times daily.    Marland Kitchen HYDROcodone-acetaminophen  (NORCO/VICODIN) 5-325 MG tablet Take 1 tablet by mouth every 6 (six) hours as needed for moderate pain.    Marland Kitchen LANTUS SOLOSTAR 100 UNIT/ML Solostar Pen USE AS DIRECTED 45 mL 1  . levothyroxine (SYNTHROID, LEVOTHROID) 150 MCG tablet TAKE 1 TABLET BY MOUTH EVERY MORNING 90 tablet 1  . losartan-hydrochlorothiazide (HYZAAR) 100-25 MG tablet TAKE 1 TABLET BY MOUTH EVERY DAY FOR HIGH BLOOD PRESSURE 90 tablet 1  . metFORMIN (GLUCOPHAGE-XR) 500 MG 24 hr tablet TAKE 4 TABLETS BY MOUTH EVERY DAY FOR DIABETES 360 tablet 3  . minocycline (MINOCIN,DYNACIN) 100 MG capsule TAKE 1 CAPSULE BY MOUTH DAILY 90 capsule 3  . Multiple Vitamin (MULTIVITAMIN) tablet Take 1 tablet by mouth daily.    Marland Kitchen MYRBETRIQ 50 MG TB24 tablet TAKE 1 TABLET BY MOUTH DAILY FOR BLADDER 90 tablet 1  . pantoprazole (PROTONIX) 40 MG tablet TAKE 1 TABLET BY MOUTH DAILY 90 tablet 0  . simvastatin (ZOCOR) 40 MG tablet TAKE 1 TABLET BY MOUTH EVERY NIGHT AT BEDTIME FOR CHOLESTEROL 90 tablet PRN   No  current facility-administered medications on file prior to visit.     ALLERGIES: Allergies  Allergen Reactions  . Ace Inhibitors     FAMILY HISTORY: Family History  Problem Relation Age of Onset  . Heart disease Mother   . Heart disease Father   . Stroke Father   . Cancer Father   . Cancer Sister   . Diabetes Maternal Grandmother   . Hypertension Sister   . Colon cancer Neg Hx   . Pancreatic cancer Neg Hx   . Stomach cancer Neg Hx   . Rectal cancer Neg Hx     SOCIAL HISTORY: Social History   Socioeconomic History  . Marital status: Married    Spouse name: Not on file  . Number of children: Not on file  . Years of education: Not on file  . Highest education level: Not on file  Occupational History  . Not on file  Social Needs  . Financial resource strain: Not on file  . Food insecurity:    Worry: Not on file    Inability: Not on file  . Transportation needs:    Medical: Not on file    Non-medical: Not on file  Tobacco  Use  . Smoking status: Former Smoker    Packs/day: 0.25    Types: Cigarettes    Last attempt to quit: 06/03/1990    Years since quitting: 27.1  . Smokeless tobacco: Never Used  Substance and Sexual Activity  . Alcohol use: No    Alcohol/week: 0.0 oz    Comment: OCCASIONAL , WINE-no longer drinks alcohol  . Drug use: No  . Sexual activity: Never  Lifestyle  . Physical activity:    Days per week: Not on file    Minutes per session: Not on file  . Stress: Not on file  Relationships  . Social connections:    Talks on phone: Not on file    Gets together: Not on file    Attends religious service: Not on file    Active member of club or organization: Not on file    Attends meetings of clubs or organizations: Not on file    Relationship status: Not on file  . Intimate partner violence:    Fear of current or ex partner: Not on file    Emotionally abused: Not on file    Physically abused: Not on file    Forced sexual activity: Not on file  Other Topics Concern  . Not on file  Social History Narrative  . Not on file    REVIEW OF SYSTEMS: Constitutional: No fevers, chills, or sweats, no generalized fatigue, change in appetite Eyes: No visual changes, double vision, eye pain Ear, nose and throat: No hearing loss, ear pain, nasal congestion, sore throat Cardiovascular: No chest pain, palpitations Respiratory:  No shortness of breath at rest or with exertion, wheezes GastrointestinaI: No nausea, vomiting, diarrhea, abdominal pain, fecal incontinence Genitourinary:  No dysuria, urinary retention or frequency Musculoskeletal:  No neck pain, +back pain Integumentary: No rash, pruritus, skin lesions Neurological: as above Psychiatric: No depression, insomnia, anxiety Endocrine: No palpitations, fatigue, diaphoresis, mood swings, change in appetite, change in weight, increased thirst Hematologic/Lymphatic:  No anemia, purpura, petechiae. Allergic/Immunologic: no itchy/runny eyes, nasal  congestion, recent allergic reactions, rashes  PHYSICAL EXAM: Vitals:   07/05/17 1005  BP: (!) 142/64  Pulse: (!) 52  SpO2: 99%   General: No acute distress Head:  Normocephalic/atraumatic Neck: supple, no paraspinal tenderness, full range of motion Heart:  Regular rate and rhythm Lungs:  Clear to auscultation bilaterally Back: No paraspinal tenderness Skin/Extremities: No rash, no edema Neurological Exam: alert and oriented to person. No dysarthria or aphasia. Recent and remote memory are impaired.  Attention and concentration are reduced, difficulty spelling WORLD. Able to repeat. Difficulty reading. CDT 0/5 MMSE - Mini Mental State Exam 07/05/2017 08/02/2016 03/04/2016  Orientation to time 0 4 5  Orientation to Place 0 5 5  Registration Attention/ Calculation 0 5 5  Recall 0 0 0  Language- name 2 objects Language- repeat 1 0 1  Language- follow 3 step command Language- read & follow direction 0 1 1  Write a sentence 0 1 1  Copy design Total score Cranial nerves: CN I: not tested CN II: pupils equal, round and reactive to light, visual fields intact CN III, IV, VI:  full range of motion, no nystagmus, no ptosis CN V: facial sensation intact CN VII: upper and lower face symmetric Bulk & Tone: normal, no fasciculations, no cogwheeling Motor: 5/5 throughout except for left shoulder limited abduction post rotator cuff surgeries (similar to prior), no pronator drift. Sensation: intact to light touch.  Romberg test negative Deep Tendon Reflexes: +2 throughout, no ankle clonus Plantar responses: downgoing bilaterally Cerebellar: no incoordination on finger to nose testing Gait: narrow-based and steady, mild difficulty with tandem walk Tremor: none  IMPRESSION: This is a pleasant 83 yo RH man with a history of  hypertension, hyperlipidemia, diabetes, hypothyroidism, with dementia. He was mostly reporting word-finding difficulties. MRI brain  in 06/2016 showed a small area of cortical encephalomalacia in the left anterior superior frontal gyrus and left middle temporal gyrus; moderate chronic microvascular disease, more pronounced on the left hemisphere, as well as scattered bilateral gyriform hemosiderin affecting mostly the anterior and superior frontal lobes, left occipital and lateral left temporal lobe, with diagnosis of superficial siderosis, sequelae of prior intracranial hemorrhage. Memory and speech difficulties likely of vascular etiology (vascular dementia), however there has been a significant change over the past few months. MMSE today 8/30 (24/30 in June 2018). Bloodwork at PCP office last month was unremarkable, a head CT without contrast will be ordered to assess for underlying structural abnormality. He is taking Donepezil  daily. His wife is mostly concerned about the behavioral changes with paranoia and agitation, proceed with starting Seroquel 12.5mg  qhs for a week, then increase to  qhs. Side effects were discussed, this may be uptitrated as tolerated. We also discussed moving to higher level of care, his wife and daughters are talking about assisted living in [place]. He does not drive. He will follow-up in 6 months and knows to call for any changes.   Thank you for allowing me to participate in his care.  Please do not hesitate to call for any questions or concerns.  The duration of this appointment visit was 30 minutes of face-to-face time with the patient.  Greater than 50% of this time was spent in counseling, explanation of diagnosis, planning of further management, and coordination of care.   Patrcia Dolly, M.D.   CC: Dr. Oneta Rack

## 2017-07-05 NOTE — Patient Instructions (Addendum)
1. Schedule head CT without contrast   We have sent a referral to [facility] for your CT and they will call you directly to schedule your appt. They are located at [address] [facility] If you need to contact them directly please call [phone].   2. Start Seroquel : Take 1/2 tablet every night for a week, then increase to 1 tablet every night if still needed 3. Continue Donepezil  daily 4. Agree with starting to look into assisted living for higher level of care 5. Follow-up in 6 months, call for any changes  FALL PRECAUTIONS: Be cautious when walking. Scan the area for obstacles that may increase the risk of trips and falls. When getting up in the mornings, sit up at the edge of the bed for a few minutes before getting out of bed. Consider elevating the bed at the head end to avoid drop of blood pressure when getting up. Walk always in a well-lit room (use night lights in the walls). Avoid area rugs or power cords from appliances in the middle of the walkways. Use a walker or a cane if necessary and consider physical therapy for balance exercise. Get your eyesight checked regularly.  FINANCIAL OVERSIGHT: Supervision, especially oversight when making financial decisions or transactions is also recommended.  HOME SAFETY: Consider the safety of the kitchen when operating appliances like stoves, microwave oven, and blender. Consider having supervision and share cooking responsibilities until no longer able to participate in those. Accidents with firearms and other hazards in the house should be identified and addressed as well.  DRIVING: Regarding driving, in patients with progressive memory problems, driving will be impaired. We advise to have someone else do the driving if trouble finding directions or if minor accidents are reported. Independent driving assessment is available to determine safety of driving.  ABILITY TO BE LEFT ALONE: If patient is unable to contact 911 operator,  consider using LifeLine, or when the need is there, arrange for someone to stay with patients. Smoking is a fire hazard, consider supervision or cessation. Risk of wandering should be assessed by caregiver and if detected at any point, supervision and safe proof recommendations should be instituted.  MEDICATION SUPERVISION: Inability to self-administer medication needs to be constantly addressed. Implement a mechanism to ensure safe administration of the medications.  RECOMMENDATIONS FOR ALL PATIENTS WITH MEMORY PROBLEMS: 1. Continue to exercise (Recommend 30 minutes of walking everyday, or 3 hours every week) 2. Increase social interactions - continue going to [place] and enjoy social gatherings with friends and family 3. Eat healthy, avoid fried foods and eat more fruits and vegetables 4. Maintain adequate blood pressure, blood sugar, and blood cholesterol level. Reducing the risk of stroke and cardiovascular disease also helps promoting better memory. 5. Avoid stressful situations. Live a simple life and avoid aggravations. Organize your time and prepare for the next day in anticipation. 6. Sleep well, avoid any interruptions of sleep and avoid any distractions in the bedroom that may interfere with adequate sleep quality 7. Avoid sugar, avoid sweets as there is a strong link between excessive sugar intake, diabetes, and cognitive impairment The Mediterranean diet has been shown to help patients reduce the risk of progressive memory disorders and reduces cardiovascular risk. This includes eating fish, eat fruits and green leafy vegetables, nuts like almonds and hazelnuts, walnuts, and also use olive oil. Avoid fast foods and fried foods as much as possible. Avoid sweets and sugar as sugar use has been linked to worsening of  memory function.  There is always a concern of gradual progression of memory problems. If this is the case, then we may need to adjust level of care according to patient needs.  Support, both to the patient and caregiver, should then be put into place.

## 2017-07-06 ENCOUNTER — Other Ambulatory Visit: Payer: Self-pay | Admitting: Internal Medicine

## 2017-07-06 DIAGNOSIS — I10 Essential (primary) hypertension: Secondary | ICD-10-CM

## 2017-07-08 ENCOUNTER — Other Ambulatory Visit: Payer: Self-pay | Admitting: Physician Assistant

## 2017-07-08 MED ORDER — GLUCOSE BLOOD VI STRP: ORAL_STRIP | 2 refills | Status: DC

## 2017-07-13 ENCOUNTER — Telehealth: Payer: Self-pay | Admitting: Neurology

## 2017-07-13 NOTE — Telephone Encounter (Signed)
If patient is at the pharmacy, call can be transferred to refill team.  1.     Which medications need to be refilled? (please list name of each medication and dose if know) Seroquel  2.     Which pharmacy/location (including street and city if local pharmacy) is medication to be sent to? [facility] [place] and [facility]. He was here to see Dr. Karel Jarvis on 5/14 and they have not been able to get the medication.   3.     Do they need a 30 or 90 day supply?  90 Days

## 2017-07-13 NOTE — Telephone Encounter (Signed)
Attempted to initiate prior authorization.  covermymeds unable to find pt.

## 2017-07-23 ENCOUNTER — Other Ambulatory Visit: Payer: Self-pay | Admitting: Internal Medicine

## 2017-07-25 ENCOUNTER — Other Ambulatory Visit: Payer: Self-pay | Admitting: Internal Medicine

## 2017-07-25 ENCOUNTER — Ambulatory Visit
Admission: RE | Admit: 2017-07-25 | Discharge: 2017-07-25 | Disposition: A | Discharge status: Home / Self Care | Payer: Medicare Other | Source: Ambulatory Visit | Attending: Neurology | Admitting: Neurology

## 2017-07-25 DIAGNOSIS — R4789 Other speech disturbances: Secondary | ICD-10-CM

## 2017-07-25 DIAGNOSIS — I639 Cerebral infarction, unspecified: Secondary | ICD-10-CM | POA: Diagnosis not present

## 2017-07-25 DIAGNOSIS — F0391 Unspecified dementia with behavioral disturbance: Secondary | ICD-10-CM

## 2017-07-25 DIAGNOSIS — F03B18 Unspecified dementia, moderate, with other behavioral disturbance: Secondary | ICD-10-CM

## 2017-07-25 DIAGNOSIS — R451 Restlessness and agitation: Secondary | ICD-10-CM

## 2017-07-26 ENCOUNTER — Telehealth: Payer: Self-pay | Admitting: Neurology

## 2017-07-26 NOTE — Telephone Encounter (Signed)
Left VM to discuss CT head results with wife. Tried calling cell listed but mailbox is full.

## 2017-07-27 NOTE — Telephone Encounter (Signed)
Spoke to wife about head CT showing new stroke in right frontal region, new since MRI in 2018. Discussed concern for siderosis and microbleeds on prior MRI, would be very hesitant to be on anticoagulation/antiplatelet due to increased risk for bleed, understanding risk for stroke without these medications. Wife agreed and expressed understanding of risks. She still has not received Seroquel, we will check on this. If unable to obtain, would do Depakote ER 250mg  qhs for agitation related to dementia.  Gerald [name], can you pls check on Seroquel again? It looks like covermymeds did not have him on list, can you check with wife if we have the right member number(?). Thanks

## 2017-07-27 NOTE — Telephone Encounter (Signed)
Patient returned DR Karel JarvisAquino call please call her back at [phone]

## 2017-08-02 ENCOUNTER — Other Ambulatory Visit: Payer: Self-pay

## 2017-08-02 DIAGNOSIS — R451 Restlessness and agitation: Secondary | ICD-10-CM

## 2017-08-02 MED ORDER — QUETIAPINE FUMARATE 25 MG PO TABS: ORAL_TABLET | ORAL | 3 refills | Status: AC

## 2017-08-05 ENCOUNTER — Ambulatory Visit: Payer: Medicare Other | Admitting: Neurology

## 2017-08-05 ENCOUNTER — Encounter

## 2017-08-19 ENCOUNTER — Other Ambulatory Visit: Payer: Self-pay | Admitting: *Deleted

## 2017-08-19 ENCOUNTER — Ambulatory Visit: Payer: Medicare Other | Admitting: Internal Medicine

## 2017-08-19 ENCOUNTER — Encounter: Payer: Self-pay | Admitting: Internal Medicine

## 2017-08-19 VITALS — BP 122/60 | HR 52 | Temp 97.4°F | Resp 16 | Ht 69.0 in | Wt 161.0 lb

## 2017-08-19 DIAGNOSIS — K219 Gastro-esophageal reflux disease without esophagitis: Secondary | ICD-10-CM | POA: Diagnosis not present

## 2017-08-19 DIAGNOSIS — E039 Hypothyroidism, unspecified: Secondary | ICD-10-CM | POA: Diagnosis not present

## 2017-08-19 DIAGNOSIS — Z79899 Other long term (current) drug therapy: Secondary | ICD-10-CM | POA: Diagnosis not present

## 2017-08-19 DIAGNOSIS — N182 Chronic kidney disease, stage 2 (mild): Secondary | ICD-10-CM | POA: Diagnosis not present

## 2017-08-19 DIAGNOSIS — Z794 Long term (current) use of insulin: Secondary | ICD-10-CM | POA: Diagnosis not present

## 2017-08-19 DIAGNOSIS — E559 Vitamin D deficiency, unspecified: Secondary | ICD-10-CM | POA: Diagnosis not present

## 2017-08-19 DIAGNOSIS — I10 Essential (primary) hypertension: Secondary | ICD-10-CM | POA: Diagnosis not present

## 2017-08-19 DIAGNOSIS — R69 Illness, unspecified: Secondary | ICD-10-CM

## 2017-08-19 DIAGNOSIS — E1122 Type 2 diabetes mellitus with diabetic chronic kidney disease: Secondary | ICD-10-CM | POA: Diagnosis not present

## 2017-08-19 DIAGNOSIS — E782 Mixed hyperlipidemia: Secondary | ICD-10-CM | POA: Diagnosis not present

## 2017-08-19 MED ORDER — SIMVASTATIN 40 MG PO TABS: ORAL_TABLET | ORAL | 3 refills | Status: AC

## 2017-08-19 NOTE — Progress Notes (Signed)
           Left  before  Seen                                                                                       This very nice 83 y.o.  MWM presents for 6 month follow up with HTN, HLD, Pre-Diabetes and Vitamin D Deficiency.      Patient is treated for HTN & BP has been controlled at home. Today's  . Patient has had no complaints of any cardiac type chest pain, palpitations, dyspnea / orthopnea / PND, dizziness, claudication, or dependent edema.     Hyperlipidemia is controlled with diet & meds. Patient denies myalgias or other med SE's. Last Lipids were  Lab Results  Component Value Date   CHOL 145 04/20/2017   HDL 40 (L) 04/20/2017   LDLCALC 70 04/20/2017   TRIG 265 (H) 04/20/2017   CHOLHDL 3.6 04/20/2017      Also, the patient has history of T2_IDDM and has had no symptoms of reactive hypoglycemia, diabetic polys, paresthesias or visual blurring.  Last A1c was  Lab Results  Component Value Date   HGBA1C 8.5 (H) 04/20/2017      Further, the patient also has history of Vitamin D Deficiency and supplements vitamin D without any suspected side-effects. Last vitamin D was   Lab Results  Component Value Date   VD25OH 66 12/24/2016

## 2017-08-20 LAB — COMPLETE METABOLIC PANEL WITH GFR
AG RATIO: 2 (calc) (ref 1.0–2.5)
ALBUMIN MSPROF: 4.3 g/dL (ref 3.6–5.1)
ALT: 32 U/L (ref 9–46)
AST: 36 U/L — AB (ref 10–35)
Alkaline phosphatase (APISO): 65 U/L (ref 40–115)
BUN / CREAT RATIO: 23 (calc) — AB (ref 6–22)
BUN: 27 mg/dL — ABNORMAL HIGH (ref 7–25)
CALCIUM: 9.8 mg/dL (ref 8.6–10.3)
CO2: 26 mmol/L (ref 20–32)
CREATININE: 1.18 mg/dL — AB (ref 0.70–1.11)
Chloride: 103 mmol/L (ref 98–110)
GFR, EST AFRICAN AMERICAN: 67 mL/min/{1.73_m2} (ref >=60)
GFR, Est Non African American: 58 mL/min/{1.73_m2} — ABNORMAL LOW (ref >=60)
GLOBULIN: 2.2 g/dL (ref 1.9–3.7)
Glucose, Bld: 155 mg/dL — ABNORMAL HIGH (ref 65–99)
POTASSIUM: 4.8 mmol/L (ref 3.5–5.3)
Sodium: 139 mmol/L (ref 135–146)
TOTAL PROTEIN: 6.5 g/dL (ref 6.1–8.1)
Total Bilirubin: 0.6 mg/dL (ref 0.2–1.2)

## 2017-08-20 LAB — CBC WITH DIFFERENTIAL/PLATELET
BASOS ABS: 37 {cells}/uL (ref 0–200)
BASOS PCT: 0.5 %
Eosinophils Absolute: 207 cells/uL (ref 15–500)
Eosinophils Relative: 2.8 %
HEMATOCRIT: 41.1 % (ref 38.5–50.0)
Hemoglobin: 14.2 g/dL (ref 13.2–17.1)
Lymphs Abs: 2139 cells/uL (ref 850–3900)
MCH: 30.3 pg (ref 27.0–33.0)
MCHC: 34.5 g/dL (ref 32.0–36.0)
MCV: 87.6 fL (ref 80.0–100.0)
MPV: 9.2 fL (ref 7.5–12.5)
Monocytes Relative: 9.2 %
Neutro Abs: 4336 cells/uL (ref 1500–7800)
Neutrophils Relative %: 58.6 %
Platelets: 177 10*3/uL (ref 140–400)
RBC: 4.69 10*6/uL (ref 4.20–5.80)
RDW: 13.4 % (ref 11.0–15.0)
TOTAL LYMPHOCYTE: 28.9 %
WBC: 7.4 10*3/uL (ref 3.8–10.8)
WBCMIX: 681 {cells}/uL (ref 200–950)

## 2017-08-20 LAB — LIPID PANEL
CHOL/HDL RATIO: 5.3 (calc) — AB (ref <5.0)
CHOLESTEROL: 212 mg/dL — AB (ref <200)
HDL: 40 mg/dL — ABNORMAL LOW (ref >40)
LDL CHOLESTEROL (CALC): 123 mg/dL — AB
Non-HDL Cholesterol (Calc): 172 mg/dL (calc) — ABNORMAL HIGH (ref <130)
Triglycerides: 400 mg/dL — ABNORMAL HIGH (ref <150)

## 2017-08-20 LAB — MAGNESIUM: MAGNESIUM: 1.9 mg/dL (ref 1.5–2.5)

## 2017-08-20 LAB — HEMOGLOBIN A1C
Hgb A1c MFr Bld: 7.9 % of total Hgb — ABNORMAL HIGH (ref <5.7)
Mean Plasma Glucose: 180 (calc)
eAG (mmol/L): 10 (calc)

## 2017-08-20 LAB — TSH: TSH: 1.12 m[IU]/L (ref 0.40–4.50)

## 2017-08-20 LAB — VITAMIN D 25 HYDROXY (VIT D DEFICIENCY, FRACTURES): Vit D, 25-Hydroxy: 60 ng/mL (ref 30–100)

## 2017-08-21 ENCOUNTER — Other Ambulatory Visit: Payer: Self-pay | Admitting: Internal Medicine

## 2017-09-01 ENCOUNTER — Ambulatory Visit (INDEPENDENT_AMBULATORY_CARE_PROVIDER_SITE_OTHER): Payer: Medicare Other | Admitting: Internal Medicine

## 2017-09-01 VITALS — BP 138/56 | HR 52 | Temp 97.9°F | Resp 16 | Ht 68.5 in | Wt 166.6 lb

## 2017-09-01 DIAGNOSIS — F0391 Unspecified dementia with behavioral disturbance: Secondary | ICD-10-CM | POA: Diagnosis not present

## 2017-09-01 DIAGNOSIS — N182 Chronic kidney disease, stage 2 (mild): Secondary | ICD-10-CM | POA: Diagnosis not present

## 2017-09-01 DIAGNOSIS — E039 Hypothyroidism, unspecified: Secondary | ICD-10-CM

## 2017-09-01 DIAGNOSIS — I10 Essential (primary) hypertension: Secondary | ICD-10-CM

## 2017-09-01 DIAGNOSIS — E782 Mixed hyperlipidemia: Secondary | ICD-10-CM

## 2017-09-01 DIAGNOSIS — Z794 Long term (current) use of insulin: Secondary | ICD-10-CM

## 2017-09-01 DIAGNOSIS — E559 Vitamin D deficiency, unspecified: Secondary | ICD-10-CM | POA: Diagnosis not present

## 2017-09-01 DIAGNOSIS — Z79899 Other long term (current) drug therapy: Secondary | ICD-10-CM

## 2017-09-01 DIAGNOSIS — E1122 Type 2 diabetes mellitus with diabetic chronic kidney disease: Secondary | ICD-10-CM

## 2017-09-01 MED ORDER — OLMESARTAN MEDOXOMIL 20 MG PO TABS: ORAL_TABLET | ORAL | 1 refills | Status: AC

## 2017-09-01 MED ORDER — CITALOPRAM HYDROBROMIDE 20 MG PO TABS: ORAL_TABLET | ORAL | 0 refills | Status: AC

## 2017-09-01 NOTE — Progress Notes (Signed)
This very nice 83 y.o. MWM presents for 6 month follow up with HTN, HLD, Pre-Diabetes and Vitamin D Deficiency. Patient was seen June 2018 by Dr Karel Jarvis for cognitive issues and she felt he had "Mild Dementia" and started Aricept. Patient has issues with poor short term recall and word finding. His confusion seems much wore today ad his wife reports his memory is much worse despite the meds. She reports that he has become much more oppositional and confrontational (verbally).      Patient is treated for HTN (1989)  & BP has been controlled at home. Today's BP is 138/56. Patient has had no complaints of any cardiac type chest pain, palpitations, dyspnea / orthopnea / PND, dizziness, claudication, or dependent edema.     Hyperlipidemia is controlled with diet & meds. Patient denies myalgias or other med SE's. Last Lipids were  Lab Results  Component Value Date   CHOL 212 (H) 08/19/2017   HDL 40 (L) 08/19/2017   LDLCALC 123 (H) 08/19/2017   TRIG 400 (H) 08/19/2017   CHOLHDL 5.3 (H) 08/19/2017      Also, the patient has history of  Insulin Requiring T2_DM and has had no symptoms of reactive hypoglycemia, diabetic polys, paresthesias or visual blurring.  Last A1c was not at goal: Lab Results  Component Value Date   HGBA1C 7.9 (H) 08/19/2017      Further, the patient also has history of Vitamin D Deficiency and supplements vitamin D without any suspected side-effects. Last vitamin D was   Lab Results  Component Value Date   VD25OH 60 08/19/2017   Current Outpatient Medications on File Prior to Visit  Medication Sig  . amLODipine (NORVASC) 10 MG tablet TAKE 1 TABLET BY MOUTH EVERY NIGHT FOR BLOOD PRESSURE  . aspirin 81 MG tablet Take by mouth daily.   Marland Kitchen atenolol (TENORMIN) 50 MG tablet Take 1 tablet (50 mg total) by mouth daily.  . B-D ULTRAFINE III SHORT PEN 31G X 8 MM MISC USE AS DIRECTED TWICE DAILY  . baclofen (LIORESAL) 10 MG tablet TAKE 1/2 TO 1 TABLET BY MOUTH 2 TO 3 TIMES A DAY AS  NEEDED FOR MUSCLE SPASMS  . celecoxib (CELEBREX) 200 MG capsule TAKE 1 CAPSULE BY MOUTH DAILY FOR PAIN AND INFLAMMATION  . Cholecalciferol (VITAMIN D PO) Take 5,000 Units by mouth daily.  Marland Kitchen donepezil (ARICEPT) 10 MG tablet Take 1 tablets once daily.  Marland Kitchen ezetimibe (ZETIA) 10 MG tablet TAKE 1 TABLET BY MOUTH DAILY  . finasteride (PROSCAR) 5 MG tablet TAKE 1 TABLET BY MOUTH DAILY  . fish oil-omega-3 fatty acids 1000 MG capsule Take 1 g by mouth daily.   Marland Kitchen glucose blood test strip Test sugar 3 x a day. DX insulin dependent diabetes Z79.4  . LANTUS SOLOSTAR 100 UNIT/ML Solostar Pen INJECT WITH PEN USE AS DIRECTED  . levothyroxine (SYNTHROID, LEVOTHROID) 150 MCG tablet TAKE 1 TABLET BY MOUTH EVERY MORNING  . losartan-hydrochlorothiazide (HYZAAR) 100-25 MG tablet TAKE 1 TABLET BY MOUTH EVERY DAY FOR HIGH BLOOD PRESSURE  . LUTEIN PO Take by mouth.  . Magnesium 400 MG TABS Take 1 tablet by mouth. Takes 2 in the AM and 1 at night  . metFORMIN (GLUCOPHAGE-XR) 500 MG 24 hr tablet TAKE 4 TABLETS BY MOUTH EVERY DAY FOR DIABETES  . minocycline (MINOCIN,DYNACIN) 100 MG capsule TAKE 1 CAPSULE BY MOUTH DAILY  . Multiple Vitamin (MULTIVITAMIN) tablet Take 1 tablet by mouth daily.  . pantoprazole (PROTONIX) 40 MG  tablet TAKE 1 TABLET BY MOUTH DAILY  . QUEtiapine (SEROQUEL) 25 MG tablet Take 1/2 tablet every night for 1 week, then increase to 1 tablet every night  . ranitidine (ZANTAC) 150 MG tablet Take 1 tablet (150 mg total) by mouth 2 (two) times daily.  . simvastatin (ZOCOR) 40 MG tablet TAKE 1 TABLET BY MOUTH EVERY NIGHT AT BEDTIME FOR CHOLESTEROL   No current facility-administered medications on file prior to visit.    Allergies  Allergen Reactions  . Ace Inhibitors    PMHx:   Past Medical History:  Diagnosis Date  . BPH (benign prostatic hypertrophy)   . Diabetes mellitus without complication (HCC)   . GERD (gastroesophageal reflux disease)   . Hyperlipidemia   . Hypertension   . Rotator cuff  tear, right RECURRENT  . Vitamin D deficiency    Immunization History  Administered Date(s) Administered  . DT 11/01/2014  . Influenza Whole 11/10/2012  . Influenza, High Dose Seasonal PF 12/03/2013, 11/01/2014, 10/16/2015, 12/24/2016  . Pneumococcal Conjugate-13 12/03/2013  . Pneumococcal Polysaccharide-23 07/27/2011  . Tdap 02/23/2004  . Zoster 07/15/2005   Past Surgical History:  Procedure Laterality Date  . LUMBAR DISC SURGERY  03/15/2017   [place] [facility] by Dr. Leeanne Rio  . LUMBAR MICRODISCECTOMY  04-06-1999   L4 - 5  . RIGHT SHOULDER ARTHROSCOPY/ LABRAL & BICEPS TENDON DEBRIDEMENT/ DISTAL CLAVICLE DECOMPRESSION/ SAD/ MINI OPEN ROTATOR CUFF REPAIR  03-09-2010  . SHOULDER ARTHROSCOPY  04-10-2004   LEFT  . SHOULDER OPEN ROTATOR CUFF REPAIR  06/09/2011   Procedure: ROTATOR CUFF REPAIR SHOULDER OPEN;  Surgeon: Drucilla Schmidt, MD;  Location: [facility];  Service: Orthopedics;  Laterality: Right;  anterior acromionectomy repair right rotator cuff with tissue mend    FHx:    Reviewed / unchanged  SHx:    Reviewed / unchanged   Systems Review:  Constitutional: Denies fever, chills, wt changes, headaches, insomnia, fatigue, night sweats, change in appetite. Eyes: Denies redness, blurred vision, diplopia, discharge, itchy, watery eyes.  ENT: Denies discharge, congestion, post nasal drip, epistaxis, sore throat, earache, hearing loss, dental pain, tinnitus, vertigo, sinus pain, snoring.  CV: Denies chest pain, palpitations, irregular heartbeat, syncope, dyspnea, diaphoresis, orthopnea, PND, claudication or edema. Respiratory: denies cough, dyspnea, DOE, pleurisy, hoarseness, laryngitis, wheezing.  Gastrointestinal: Denies dysphagia, odynophagia, heartburn, reflux, water brash, abdominal pain or cramps, nausea, vomiting, bloating, diarrhea, constipation, hematemesis, melena, hematochezia  or hemorrhoids. Genitourinary: Denies dysuria, frequency, urgency, nocturia,  hesitancy, discharge, hematuria or flank pain. Musculoskeletal: Denies arthralgias, myalgias, stiffness, jt. swelling, pain, limping or strain/sprain.  Skin: Denies pruritus, rash, hives, warts, acne, eczema or change in skin lesion(s). Neuro: No weakness, tremor, incoordination, spasms, paresthesia or pain. Psychiatric: Denies confusion, memory loss or sensory loss. Endo: Denies change in weight, skin or hair change.  Heme/Lymph: No excessive bleeding, bruising or enlarged lymph nodes.  Physical Exam  BP (!) 138/56   Pulse (!) 52   Temp 97.9 F (36.6 C)   Resp 16   Ht 5' 8.5" (1.74 m)   Wt 166 lb 9.6 oz (75.6 kg)   BMI 24.96 kg/m   Appears  well nourished, well groomed  and in no distress.  Eyes: PERRLA, EOMs, conjunctiva no swelling or erythema. Sinuses: No frontal/maxillary tenderness ENT/Mouth: EAC's clear, TM's nl w/o erythema, bulging. Nares clear w/o erythema, swelling, exudates. Oropharynx clear without erythema or exudates. Oral hygiene is good. Tongue normal, non obstructing. Hearing intact.  Neck: Supple. Thyroid not palpable. Car 2+/2+ without  bruits, nodes or JVD. Chest: Respirations nl with BS clear & equal w/o rales, rhonchi, wheezing or stridor.  Cor: Heart sounds normal w/ regular rate and rhythm without sig. murmurs, gallops, clicks or rubs. Peripheral pulses normal and equal  without edema.  Abdomen: Soft & bowel sounds normal. Non-tender w/o guarding, rebound, hernias, masses or organomegaly.  Lymphatics: Unremarkable.  Musculoskeletal: Full ROM all peripheral extremities, joint stability, 5/5 strength and normal gait.  Skin: Warm, dry without exposed rashes, lesions or ecchymosis apparent.  Neuro: Cranial nerves intact, reflexes equal bilaterally. Sensory-motor testing grossly intact. Tendon reflexes grossly intact.  Pysch: Alert & oriented x 1-2.  Insight and judgement very limited. Patient is speaking nonsense & gibberish.  Assessment and Plan:  1.  Essential hypertension  - Continue medication, monitor blood pressure at home.  - Continue DASH diet.  Reminder to go to the ER if any CP,  SOB, nausea, dizziness, severe HA, changes vision/speech.  - olmesartan (BENICAR) 20 MG tablet; Take 1 tablet daily for BP  (Replaces Losartan)  Dispense: 90 tablet; Refill: 1  2. Hyperlipidemia, mixed  - Continue diet/meds, exercise,& lifestyle modifications.  - Continue monitor periodic cholesterol/liver & renal functions   3. Type 2 diabetes mellitus with stage 2 chronic kidney disease, with long-term current use of insulin (HCC)  - Continue diet, exercise, lifestyle modifications.  - Monitor appropriate labs.  4. Vitamin D deficiency  - Continue supplementation.  5. Dementia with behavioral disturbance, unspecified dementia type  - citalopram (CELEXA) 20 MG tablet; Take 1/2 to 1 tablet daily for mood & relaxation  Dispense: 90 tablet; Refill: 0  6. Hypothyroidism   7. Medication management        Discussed  regular exercise, BP monitoring, weight control to maintain BMI less than 25 and discussed med and SE's. Recent labsresults were reviewed in detail with patient and his wife. Over 30 minutes of exam, counseling, chart review was performed.

## 2017-09-01 NOTE — Patient Instructions (Signed)

## 2017-09-04 ENCOUNTER — Encounter: Payer: Self-pay | Admitting: Internal Medicine

## 2017-09-22 ENCOUNTER — Other Ambulatory Visit: Payer: Self-pay | Admitting: Internal Medicine

## 2017-09-24 ENCOUNTER — Other Ambulatory Visit: Payer: Self-pay | Admitting: Internal Medicine

## 2017-09-28 ENCOUNTER — Other Ambulatory Visit: Payer: Self-pay | Admitting: Internal Medicine

## 2017-10-17 ENCOUNTER — Ambulatory Visit: Payer: Self-pay | Admitting: Internal Medicine

## 2017-10-25 ENCOUNTER — Ambulatory Visit (INDEPENDENT_AMBULATORY_CARE_PROVIDER_SITE_OTHER): Payer: Medicare Other | Admitting: Internal Medicine

## 2017-10-25 ENCOUNTER — Encounter: Payer: Self-pay | Admitting: Internal Medicine

## 2017-10-25 VITALS — BP 118/60 | HR 48 | Temp 97.9°F | Resp 16 | Ht 68.5 in | Wt 168.4 lb

## 2017-10-25 DIAGNOSIS — I10 Essential (primary) hypertension: Secondary | ICD-10-CM | POA: Diagnosis not present

## 2017-10-25 DIAGNOSIS — E1122 Type 2 diabetes mellitus with diabetic chronic kidney disease: Secondary | ICD-10-CM

## 2017-10-25 DIAGNOSIS — N182 Chronic kidney disease, stage 2 (mild): Secondary | ICD-10-CM | POA: Diagnosis not present

## 2017-10-25 DIAGNOSIS — F0391 Unspecified dementia with behavioral disturbance: Secondary | ICD-10-CM | POA: Diagnosis not present

## 2017-10-25 DIAGNOSIS — Z794 Long term (current) use of insulin: Secondary | ICD-10-CM | POA: Diagnosis not present

## 2017-10-25 NOTE — Patient Instructions (Signed)

## 2017-10-25 NOTE — Progress Notes (Signed)
Subjective:    Patient ID: Gerald [name], male    DOB: 08/01/1934, 83 y.o.   MRN: [id number]  HPI   This very nice 83 yo MWM w/HTN, HLD, Ins Req T2_DM, Hypothyroidism   Returns for 6 week f/u on Celexa. Wife reports that he is much improved w/o any more "Outbursts" . She also elated that they are moving in Nov to  around [place] to be close to her daughter. Wife reports CBG's ranging betw [phone] fasting.  Medication Sig  . amLODipine 10 MG  TAKE 1 TABLET  EVERY NIGHT   . aspirin 81 MG  Take by mouth daily.   Marland Kitchen atenolol  50 MG  Take 1 tablet (daily.  . baclofen  10 MG  TAKE 1/2-1 TABLET 2-3 x  /DAY AS NEEDED   . celecoxib 200 MG cap TAKE 1 CAPSULE  DAILY FOR PAIN   . VITAMIN D PO) 5,000 Units  Take daily.  . citalopram (CELEXA) 20 MG  Take 1/2 to 1 tablet daily for mood & relaxation  . donepezil (ARICEPT) 10 MG  Take 1 tablets once daily.  Marland Kitchen ezetimibe10 MG tablet TAKE 1 TABLETDAILY  . finasteride  5 MG  TAKE 1 TABLET  DAILY  . fish oil-omega-3 1000 MG  Take 1 g by mouth daily.   Marland Kitchen LANTUS SOLOSTAR 100 u/ml   Uses 50 u qam & 20 u qpm   . Levothyroxine 150 MCG TAKE 1 TABLET EVERY MORNING  . LUTEIN  Take daily  . Magnesium 400 MG TABS Takes 2 in the AM and 1 at night  . metFORMIN-XR 500 MG TAKE 4 TABLETS  EVERY DAY   . minocycline 100 MG cap TAKE 1 CAPSULE  DAILY  . Multi-Vit Take 1 tablet by mouth daily.  Marland Kitchen olmesartan  20 MG  Take 1 tablet daily for BP  . Pantoprazole 40 MG  TAKE 1 TABLET  DAILY  . QUEtiapine 25 MG  Take 1/2 tablet every night for 1 week, then increase to 1 tablet every night  . Ranitidine 150 MG Take 1 tablet  2  times daily.  . simvastatin 40 MG TAKE 1 TABLET  EVERY NIGHT   . losartan-hctz  100-25 MG  TAKE 1 TABLET  EVERY DAY     Allergies  Allergen Reactions  . Ace Inhibitors    Past Medical History:  Diagnosis Date  . BPH (benign prostatic hypertrophy)   . Diabetes mellitus without complication (HCC)   . GERD (gastroesophageal reflux disease)   .  Hyperlipidemia   . Hypertension   . Rotator cuff tear, right RECURRENT  . Vitamin D deficiency    Past Surgical History:  Procedure Laterality Date  . LUMBAR DISC SURGERY  03/15/2017   [place] [facility] by Dr. Leeanne Rio  . LUMBAR MICRODISCECTOMY  04-06-1999   L4 - 5  . RIGHT SHOULDER ARTHROSCOPY/ LABRAL & BICEPS TENDON DEBRIDEMENT/ DISTAL CLAVICLE DECOMPRESSION/ SAD/ MINI OPEN ROTATOR CUFF REPAIR  03-09-2010  . SHOULDER ARTHROSCOPY  04-10-2004   LEFT  . SHOULDER OPEN ROTATOR CUFF REPAIR  06/09/2011   Procedure: ROTATOR CUFF REPAIR SHOULDER OPEN;  Surgeon: Drucilla Schmidt, MD;  Location: [facility];  Service: Orthopedics;  Laterality: Right;  anterior acromionectomy repair right rotator cuff with tissue mend    Review of Systems    10 point systems review negative except as above.    Objective:   Physical Exam  BP 118/60   Pulse (!) 48   Temp  97.9 F (36.6 C)   Resp 16   Ht 5' 8.5" (1.74 m)   Wt 168 lb 6.4 oz (76.4 kg)   BMI 25.23 kg/m   HEENT - WNL. Neck - supple.  Chest - Clear equal BS. Cor - Nl HS. RRR w/o sig MGR. PP 1(+). No edema. MS- FROM w/o deformities.  Gait Nl. Neuro -  Nl w/o focal abnormalities. Poor insight & ST recall.          Assessment & Plan:   1. Essential hypertension  2. Dementia with behavioral disturbance, unspecified dementia type  3. Type 2 diabetes mellitus with stage 2 chronic kidney disease, with long-term current use of insulin [facility])  - wife is establishing f/u medical thru her daughter in [place].

## 2017-11-11 ENCOUNTER — Other Ambulatory Visit: Payer: Self-pay | Admitting: Adult Health

## 2017-11-11 DIAGNOSIS — F0391 Unspecified dementia with behavioral disturbance: Secondary | ICD-10-CM

## 2017-11-11 DIAGNOSIS — R451 Restlessness and agitation: Secondary | ICD-10-CM

## 2017-11-17 ENCOUNTER — Other Ambulatory Visit: Payer: Self-pay

## 2017-11-17 MED ORDER — QUETIAPINE FUMARATE 50 MG PO TABS: ORAL_TABLET | ORAL | 0 refills | Status: DC

## 2017-11-24 ENCOUNTER — Other Ambulatory Visit: Payer: Self-pay | Admitting: Internal Medicine

## 2017-11-30 ENCOUNTER — Other Ambulatory Visit: Payer: Self-pay | Admitting: Internal Medicine

## 2017-12-23 ENCOUNTER — Other Ambulatory Visit: Payer: Self-pay | Admitting: Internal Medicine

## 2017-12-23 ENCOUNTER — Other Ambulatory Visit: Payer: Self-pay | Admitting: Adult Health

## 2017-12-26 ENCOUNTER — Telehealth: Payer: Self-pay

## 2017-12-26 MED ORDER — QUETIAPINE FUMARATE 50 MG PO TABS: ORAL_TABLET | ORAL | 0 refills | Status: AC

## 2017-12-26 NOTE — Telephone Encounter (Signed)
Refill request for Seroquel, 50mg . Patient has relocated to [place]. Please advise.

## 2017-12-26 NOTE — Addendum Note (Signed)
Addended by: Dionicio Stall on: 12/26/2017 04:59 PM   Modules accepted: Orders

## 2018-01-05 ENCOUNTER — Other Ambulatory Visit: Payer: Self-pay | Admitting: Internal Medicine

## 2018-01-05 DIAGNOSIS — I10 Essential (primary) hypertension: Secondary | ICD-10-CM

## 2018-01-23 ENCOUNTER — Encounter: Payer: Self-pay | Admitting: Internal Medicine

## 2018-01-24 ENCOUNTER — Other Ambulatory Visit: Payer: Self-pay | Admitting: Internal Medicine

## 2018-02-07 ENCOUNTER — Other Ambulatory Visit: Payer: Self-pay

## 2018-02-08 ENCOUNTER — Ambulatory Visit: Payer: Medicare Other | Admitting: Neurology

## 2018-02-18 ENCOUNTER — Other Ambulatory Visit: Payer: Self-pay | Admitting: Internal Medicine

## 2018-02-18 DIAGNOSIS — I10 Essential (primary) hypertension: Secondary | ICD-10-CM

## 2018-02-21 DIAGNOSIS — E7800 Pure hypercholesterolemia, unspecified: Secondary | ICD-10-CM | POA: Diagnosis not present

## 2018-02-21 DIAGNOSIS — M160 Bilateral primary osteoarthritis of hip: Secondary | ICD-10-CM | POA: Diagnosis not present

## 2018-02-21 DIAGNOSIS — E0840 Diabetes mellitus due to underlying condition with diabetic neuropathy, unspecified: Secondary | ICD-10-CM | POA: Diagnosis not present

## 2018-02-21 DIAGNOSIS — I10 Essential (primary) hypertension: Secondary | ICD-10-CM | POA: Diagnosis not present

## 2018-02-21 DIAGNOSIS — Z79899 Other long term (current) drug therapy: Secondary | ICD-10-CM | POA: Diagnosis not present

## 2018-02-23 DIAGNOSIS — Z79899 Other long term (current) drug therapy: Secondary | ICD-10-CM | POA: Diagnosis not present

## 2018-03-10 DIAGNOSIS — R269 Unspecified abnormalities of gait and mobility: Secondary | ICD-10-CM | POA: Diagnosis not present

## 2018-03-10 DIAGNOSIS — Z79899 Other long term (current) drug therapy: Secondary | ICD-10-CM | POA: Diagnosis not present

## 2018-03-10 DIAGNOSIS — S40021A Contusion of right upper arm, initial encounter: Secondary | ICD-10-CM | POA: Diagnosis not present

## 2018-03-10 DIAGNOSIS — E0840 Diabetes mellitus due to underlying condition with diabetic neuropathy, unspecified: Secondary | ICD-10-CM | POA: Diagnosis not present

## 2018-03-10 DIAGNOSIS — W19XXXA Unspecified fall, initial encounter: Secondary | ICD-10-CM | POA: Diagnosis not present

## 2018-03-10 DIAGNOSIS — J0100 Acute maxillary sinusitis, unspecified: Secondary | ICD-10-CM | POA: Diagnosis not present

## 2018-03-10 DIAGNOSIS — M79631 Pain in right forearm: Secondary | ICD-10-CM | POA: Diagnosis not present

## 2018-03-13 DIAGNOSIS — I10 Essential (primary) hypertension: Secondary | ICD-10-CM | POA: Diagnosis not present

## 2018-03-13 DIAGNOSIS — Z794 Long term (current) use of insulin: Secondary | ICD-10-CM | POA: Diagnosis not present

## 2018-03-13 DIAGNOSIS — M159 Polyosteoarthritis, unspecified: Secondary | ICD-10-CM | POA: Diagnosis not present

## 2018-03-13 DIAGNOSIS — Z7982 Long term (current) use of aspirin: Secondary | ICD-10-CM | POA: Diagnosis not present

## 2018-03-13 DIAGNOSIS — Z9181 History of falling: Secondary | ICD-10-CM | POA: Diagnosis not present

## 2018-03-13 DIAGNOSIS — E119 Type 2 diabetes mellitus without complications: Secondary | ICD-10-CM | POA: Diagnosis not present

## 2018-03-13 DIAGNOSIS — F0390 Unspecified dementia without behavioral disturbance: Secondary | ICD-10-CM | POA: Diagnosis not present

## 2018-03-24 DIAGNOSIS — M160 Bilateral primary osteoarthritis of hip: Secondary | ICD-10-CM | POA: Diagnosis not present

## 2018-03-24 DIAGNOSIS — S40021D Contusion of right upper arm, subsequent encounter: Secondary | ICD-10-CM | POA: Diagnosis not present

## 2018-03-24 DIAGNOSIS — E0840 Diabetes mellitus due to underlying condition with diabetic neuropathy, unspecified: Secondary | ICD-10-CM | POA: Diagnosis not present

## 2018-03-24 DIAGNOSIS — F0390 Unspecified dementia without behavioral disturbance: Secondary | ICD-10-CM | POA: Diagnosis not present

## 2018-04-07 DIAGNOSIS — R400 Somnolence: Secondary | ICD-10-CM | POA: Diagnosis not present

## 2018-04-07 DIAGNOSIS — F0390 Unspecified dementia without behavioral disturbance: Secondary | ICD-10-CM | POA: Diagnosis not present

## 2018-04-07 DIAGNOSIS — Z79899 Other long term (current) drug therapy: Secondary | ICD-10-CM | POA: Diagnosis not present

## 2018-04-07 DIAGNOSIS — E0840 Diabetes mellitus due to underlying condition with diabetic neuropathy, unspecified: Secondary | ICD-10-CM | POA: Diagnosis not present

## 2018-04-07 DIAGNOSIS — I10 Essential (primary) hypertension: Secondary | ICD-10-CM | POA: Diagnosis not present

## 2018-04-12 DIAGNOSIS — Z794 Long term (current) use of insulin: Secondary | ICD-10-CM | POA: Diagnosis not present

## 2018-04-12 DIAGNOSIS — M159 Polyosteoarthritis, unspecified: Secondary | ICD-10-CM | POA: Diagnosis not present

## 2018-04-12 DIAGNOSIS — E119 Type 2 diabetes mellitus without complications: Secondary | ICD-10-CM | POA: Diagnosis not present

## 2018-04-12 DIAGNOSIS — Z7982 Long term (current) use of aspirin: Secondary | ICD-10-CM | POA: Diagnosis not present

## 2018-04-12 DIAGNOSIS — I10 Essential (primary) hypertension: Secondary | ICD-10-CM | POA: Diagnosis not present

## 2018-04-12 DIAGNOSIS — Z9181 History of falling: Secondary | ICD-10-CM | POA: Diagnosis not present

## 2018-04-12 DIAGNOSIS — F0390 Unspecified dementia without behavioral disturbance: Secondary | ICD-10-CM | POA: Diagnosis not present

## 2018-04-18 DIAGNOSIS — F0390 Unspecified dementia without behavioral disturbance: Secondary | ICD-10-CM | POA: Diagnosis not present

## 2018-04-18 DIAGNOSIS — E7800 Pure hypercholesterolemia, unspecified: Secondary | ICD-10-CM | POA: Diagnosis not present

## 2018-04-18 DIAGNOSIS — E119 Type 2 diabetes mellitus without complications: Secondary | ICD-10-CM | POA: Diagnosis not present

## 2018-04-18 DIAGNOSIS — F419 Anxiety disorder, unspecified: Secondary | ICD-10-CM | POA: Diagnosis not present

## 2018-04-18 DIAGNOSIS — I10 Essential (primary) hypertension: Secondary | ICD-10-CM | POA: Diagnosis not present

## 2018-04-24 ENCOUNTER — Ambulatory Visit: Payer: Self-pay | Admitting: Adult Health

## 2018-04-25 DIAGNOSIS — M21371 Foot drop, right foot: Secondary | ICD-10-CM | POA: Diagnosis not present

## 2018-04-25 DIAGNOSIS — E0840 Diabetes mellitus due to underlying condition with diabetic neuropathy, unspecified: Secondary | ICD-10-CM | POA: Diagnosis not present

## 2018-04-25 DIAGNOSIS — F0390 Unspecified dementia without behavioral disturbance: Secondary | ICD-10-CM | POA: Diagnosis not present

## 2018-04-25 DIAGNOSIS — M160 Bilateral primary osteoarthritis of hip: Secondary | ICD-10-CM | POA: Diagnosis not present

## 2018-05-02 DIAGNOSIS — R4689 Other symptoms and signs involving appearance and behavior: Secondary | ICD-10-CM | POA: Diagnosis not present

## 2018-05-02 DIAGNOSIS — W19XXXA Unspecified fall, initial encounter: Secondary | ICD-10-CM | POA: Diagnosis not present

## 2018-05-02 DIAGNOSIS — E1165 Type 2 diabetes mellitus with hyperglycemia: Secondary | ICD-10-CM | POA: Diagnosis not present

## 2018-05-02 DIAGNOSIS — Z794 Long term (current) use of insulin: Secondary | ICD-10-CM | POA: Diagnosis not present

## 2018-05-02 DIAGNOSIS — Z79899 Other long term (current) drug therapy: Secondary | ICD-10-CM | POA: Diagnosis not present

## 2018-05-02 DIAGNOSIS — S20212A Contusion of left front wall of thorax, initial encounter: Secondary | ICD-10-CM | POA: Diagnosis not present

## 2018-05-02 DIAGNOSIS — R0789 Other chest pain: Secondary | ICD-10-CM | POA: Diagnosis not present

## 2018-05-04 DIAGNOSIS — M545 Low back pain: Secondary | ICD-10-CM | POA: Diagnosis not present

## 2018-05-04 DIAGNOSIS — W19XXXA Unspecified fall, initial encounter: Secondary | ICD-10-CM | POA: Diagnosis not present

## 2018-05-04 DIAGNOSIS — R102 Pelvic and perineal pain: Secondary | ICD-10-CM | POA: Diagnosis not present

## 2018-05-05 DIAGNOSIS — M6281 Muscle weakness (generalized): Secondary | ICD-10-CM | POA: Diagnosis not present

## 2018-05-05 DIAGNOSIS — E119 Type 2 diabetes mellitus without complications: Secondary | ICD-10-CM | POA: Diagnosis not present

## 2018-05-05 DIAGNOSIS — R52 Pain, unspecified: Secondary | ICD-10-CM | POA: Diagnosis not present

## 2018-05-05 DIAGNOSIS — G912 (Idiopathic) normal pressure hydrocephalus: Secondary | ICD-10-CM | POA: Diagnosis not present

## 2018-05-05 DIAGNOSIS — R200 Anesthesia of skin: Secondary | ICD-10-CM | POA: Diagnosis not present

## 2018-05-05 DIAGNOSIS — R531 Weakness: Secondary | ICD-10-CM | POA: Diagnosis not present

## 2018-05-05 DIAGNOSIS — G9389 Other specified disorders of brain: Secondary | ICD-10-CM | POA: Diagnosis not present

## 2018-05-05 DIAGNOSIS — R4182 Altered mental status, unspecified: Secondary | ICD-10-CM | POA: Diagnosis not present

## 2018-05-05 DIAGNOSIS — F0390 Unspecified dementia without behavioral disturbance: Secondary | ICD-10-CM | POA: Diagnosis not present

## 2018-05-05 DIAGNOSIS — Z87891 Personal history of nicotine dependence: Secondary | ICD-10-CM | POA: Diagnosis not present

## 2018-05-05 DIAGNOSIS — R296 Repeated falls: Secondary | ICD-10-CM | POA: Diagnosis not present

## 2018-05-09 DIAGNOSIS — E1165 Type 2 diabetes mellitus with hyperglycemia: Secondary | ICD-10-CM | POA: Diagnosis not present

## 2018-05-09 DIAGNOSIS — M21371 Foot drop, right foot: Secondary | ICD-10-CM | POA: Diagnosis not present

## 2018-05-09 DIAGNOSIS — G8190 Hemiplegia, unspecified affecting unspecified side: Secondary | ICD-10-CM | POA: Diagnosis not present

## 2018-05-09 DIAGNOSIS — I6389 Other cerebral infarction: Secondary | ICD-10-CM | POA: Diagnosis not present

## 2018-05-12 DIAGNOSIS — F0390 Unspecified dementia without behavioral disturbance: Secondary | ICD-10-CM | POA: Diagnosis not present

## 2018-05-12 DIAGNOSIS — Z7982 Long term (current) use of aspirin: Secondary | ICD-10-CM | POA: Diagnosis not present

## 2018-05-12 DIAGNOSIS — Z9181 History of falling: Secondary | ICD-10-CM | POA: Diagnosis not present

## 2018-05-12 DIAGNOSIS — M159 Polyosteoarthritis, unspecified: Secondary | ICD-10-CM | POA: Diagnosis not present

## 2018-05-12 DIAGNOSIS — E119 Type 2 diabetes mellitus without complications: Secondary | ICD-10-CM | POA: Diagnosis not present

## 2018-05-12 DIAGNOSIS — I10 Essential (primary) hypertension: Secondary | ICD-10-CM | POA: Diagnosis not present

## 2018-05-12 DIAGNOSIS — Z794 Long term (current) use of insulin: Secondary | ICD-10-CM | POA: Diagnosis not present

## 2018-05-15 DIAGNOSIS — Z9181 History of falling: Secondary | ICD-10-CM | POA: Diagnosis not present

## 2018-05-15 DIAGNOSIS — I10 Essential (primary) hypertension: Secondary | ICD-10-CM | POA: Diagnosis not present

## 2018-05-15 DIAGNOSIS — E119 Type 2 diabetes mellitus without complications: Secondary | ICD-10-CM | POA: Diagnosis not present

## 2018-05-15 DIAGNOSIS — M159 Polyosteoarthritis, unspecified: Secondary | ICD-10-CM | POA: Diagnosis not present

## 2018-05-15 DIAGNOSIS — Z7982 Long term (current) use of aspirin: Secondary | ICD-10-CM | POA: Diagnosis not present

## 2018-05-15 DIAGNOSIS — F0390 Unspecified dementia without behavioral disturbance: Secondary | ICD-10-CM | POA: Diagnosis not present

## 2018-05-18 DIAGNOSIS — E1165 Type 2 diabetes mellitus with hyperglycemia: Secondary | ICD-10-CM | POA: Diagnosis not present

## 2018-05-18 DIAGNOSIS — G8190 Hemiplegia, unspecified affecting unspecified side: Secondary | ICD-10-CM | POA: Diagnosis not present

## 2018-05-18 DIAGNOSIS — R443 Hallucinations, unspecified: Secondary | ICD-10-CM | POA: Diagnosis not present

## 2018-05-18 DIAGNOSIS — I6389 Other cerebral infarction: Secondary | ICD-10-CM | POA: Diagnosis not present

## 2018-05-23 DIAGNOSIS — I6389 Other cerebral infarction: Secondary | ICD-10-CM | POA: Diagnosis not present

## 2018-05-23 DIAGNOSIS — Z111 Encounter for screening for respiratory tuberculosis: Secondary | ICD-10-CM | POA: Diagnosis not present

## 2018-05-23 DIAGNOSIS — G8190 Hemiplegia, unspecified affecting unspecified side: Secondary | ICD-10-CM | POA: Diagnosis not present

## 2018-05-23 DIAGNOSIS — E1165 Type 2 diabetes mellitus with hyperglycemia: Secondary | ICD-10-CM | POA: Diagnosis not present

## 2018-05-23 DIAGNOSIS — F0390 Unspecified dementia without behavioral disturbance: Secondary | ICD-10-CM | POA: Diagnosis not present

## 2018-05-31 DIAGNOSIS — F0391 Unspecified dementia with behavioral disturbance: Secondary | ICD-10-CM | POA: Diagnosis not present

## 2018-06-02 DIAGNOSIS — F0391 Unspecified dementia with behavioral disturbance: Secondary | ICD-10-CM | POA: Diagnosis not present

## 2018-06-02 DIAGNOSIS — I10 Essential (primary) hypertension: Secondary | ICD-10-CM | POA: Diagnosis not present

## 2018-06-02 DIAGNOSIS — N400 Enlarged prostate without lower urinary tract symptoms: Secondary | ICD-10-CM | POA: Diagnosis not present

## 2018-06-02 DIAGNOSIS — E119 Type 2 diabetes mellitus without complications: Secondary | ICD-10-CM | POA: Diagnosis not present

## 2018-06-02 DIAGNOSIS — K219 Gastro-esophageal reflux disease without esophagitis: Secondary | ICD-10-CM | POA: Diagnosis not present

## 2018-06-02 DIAGNOSIS — E559 Vitamin D deficiency, unspecified: Secondary | ICD-10-CM | POA: Diagnosis not present

## 2018-06-02 DIAGNOSIS — M6281 Muscle weakness (generalized): Secondary | ICD-10-CM | POA: Diagnosis not present

## 2018-06-02 DIAGNOSIS — M1990 Unspecified osteoarthritis, unspecified site: Secondary | ICD-10-CM | POA: Diagnosis not present

## 2018-06-02 DIAGNOSIS — E039 Hypothyroidism, unspecified: Secondary | ICD-10-CM | POA: Diagnosis not present

## 2018-06-02 DIAGNOSIS — E785 Hyperlipidemia, unspecified: Secondary | ICD-10-CM | POA: Diagnosis not present

## 2018-06-02 DIAGNOSIS — Z7982 Long term (current) use of aspirin: Secondary | ICD-10-CM | POA: Diagnosis not present

## 2018-06-02 DIAGNOSIS — E639 Nutritional deficiency, unspecified: Secondary | ICD-10-CM | POA: Diagnosis not present

## 2019-01-16 IMAGING — CT CT HEAD W/O CM
3 of 4 series · 14 of 47 positions shown, 16 images · non-contrast
Comparison: 06/23/2016 brain MRI

CLINICAL DATA: Moderate dementia. Agitation. Difficulty with word
finding

EXAM:
CT HEAD WITHOUT CONTRAST
TECHNIQUE: Contiguous axial images were obtained from the base of the skull
through the vertex without intravenous contrast.

[Series 2: head 5.00 hr40 s3 ibhc · axial · 0.44mm/px · z∈[-564,-439]mm · 8 of 31 slices shown, 10 images]
[im 3/31  brain]
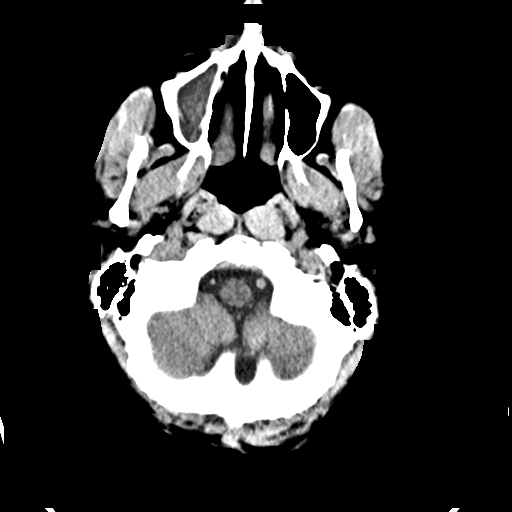
[im 3/31  bone]
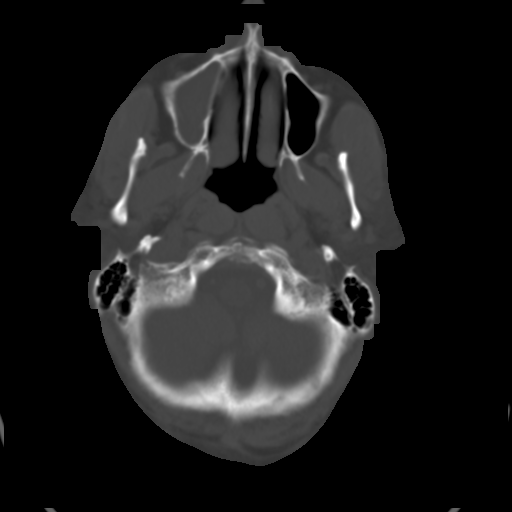
[im 7/31  brain]
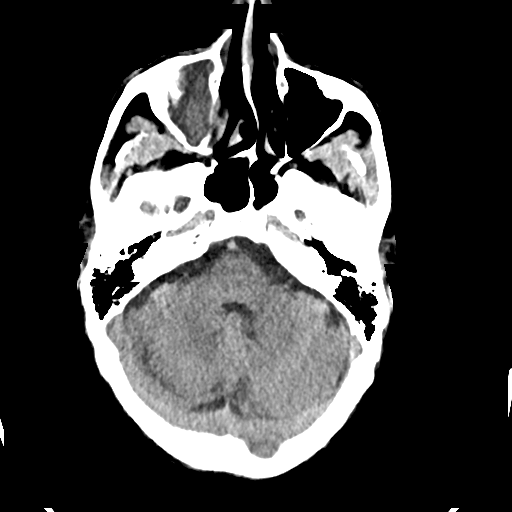
[im 11/31  brain]
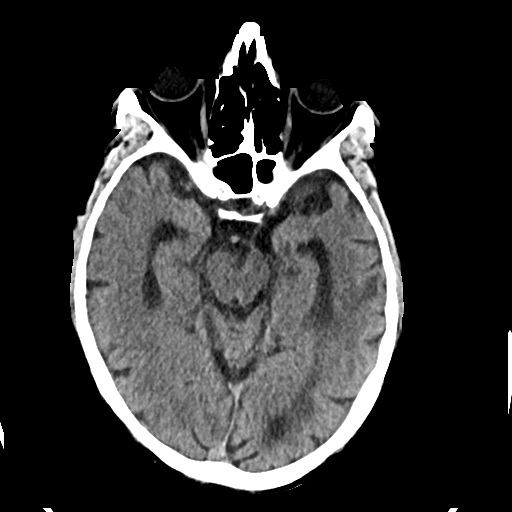
[im 13/31  brain]
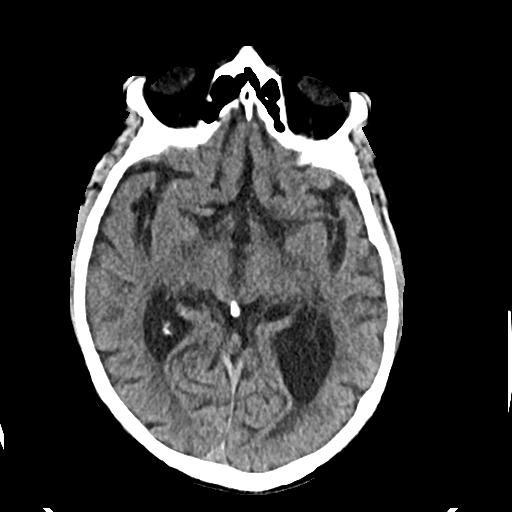
[im 18/31  brain]
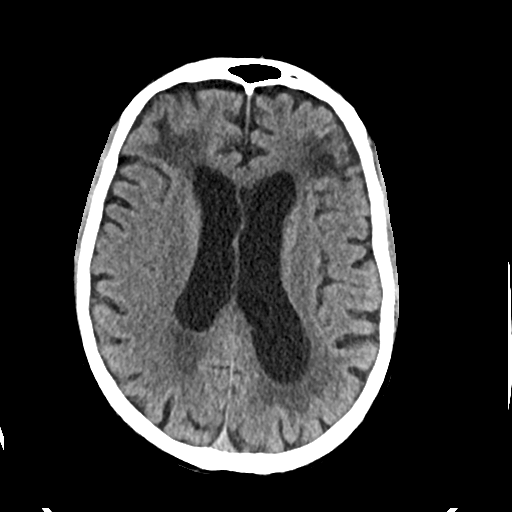
[im 18/31  bone]
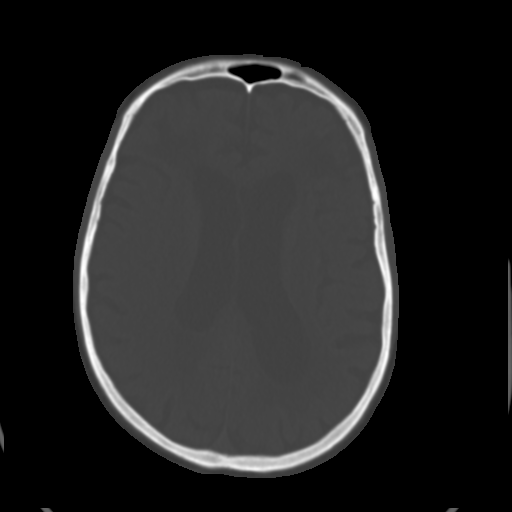
[im 20/31  brain]
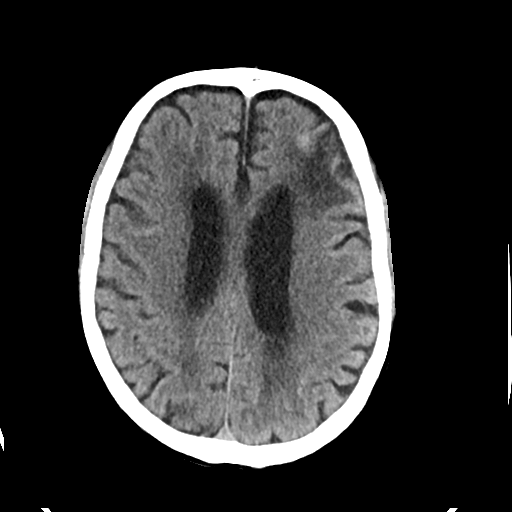
[im 24/31  brain]
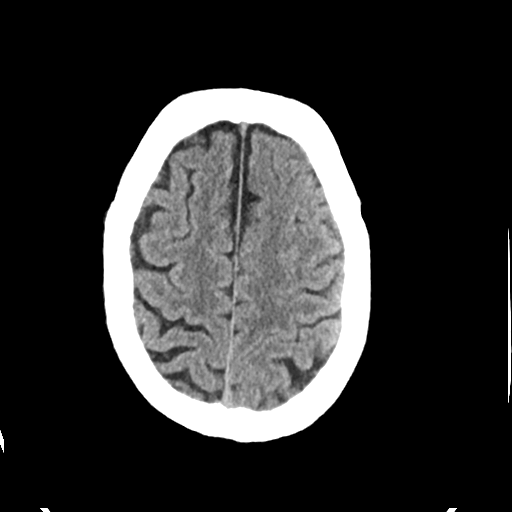
[im 28/31  brain]
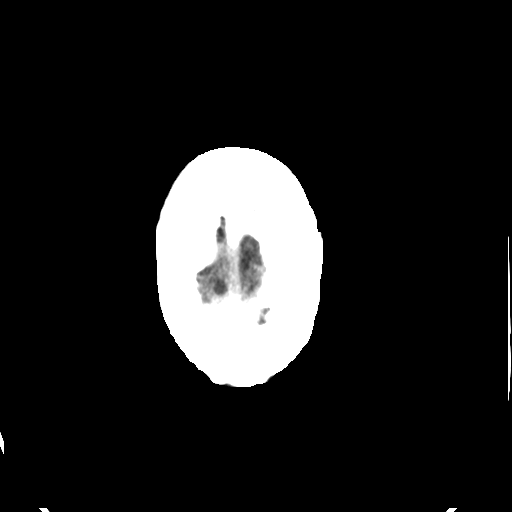

[Series 4: head 3.00 hr40 s3 cor · coronal · 0.30mm/px · 3 of 73 slices shown]
[im 25/73  brain]
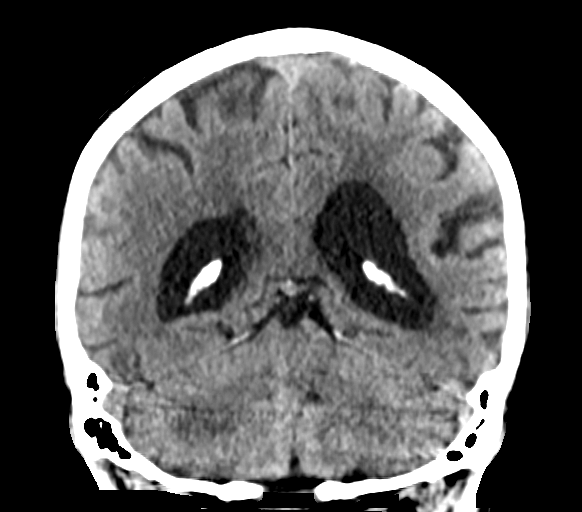
[im 33/73  brain]
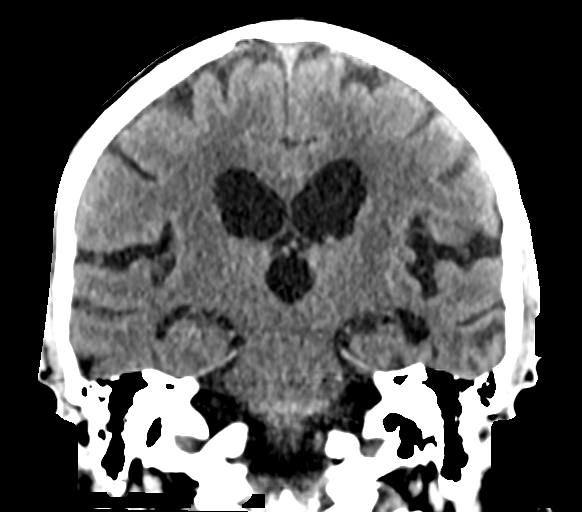
[im 41/73  brain]
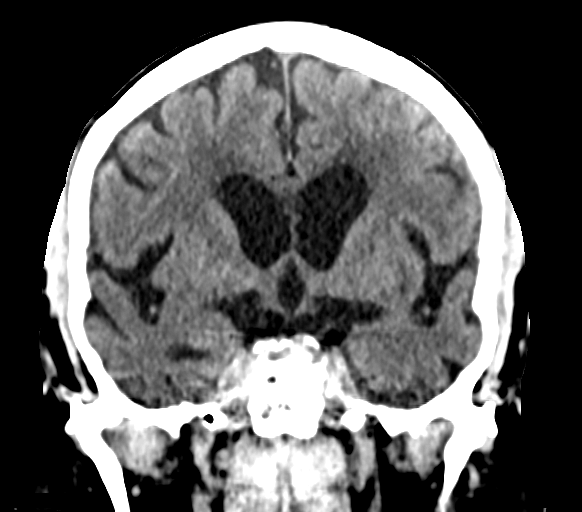

[Series 6: head 3.00 hr40 s3 sag · sagittal · 0.30mm/px · 3 of 58 slices shown]
[im 20/58  brain]
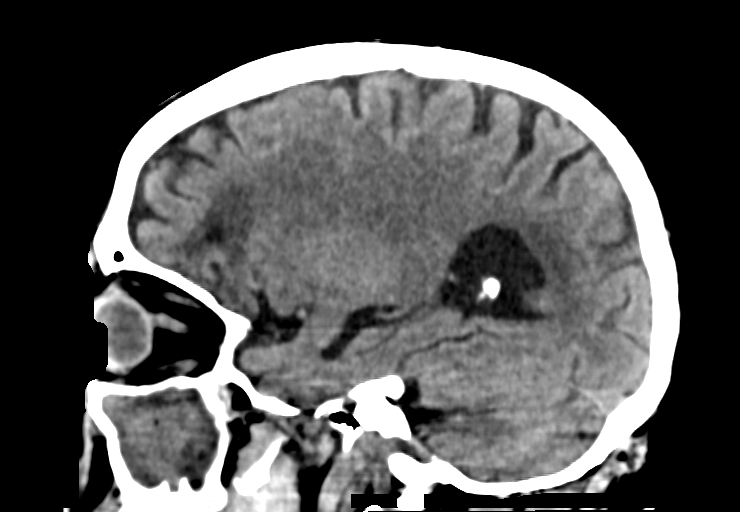
[im 29/58  brain]
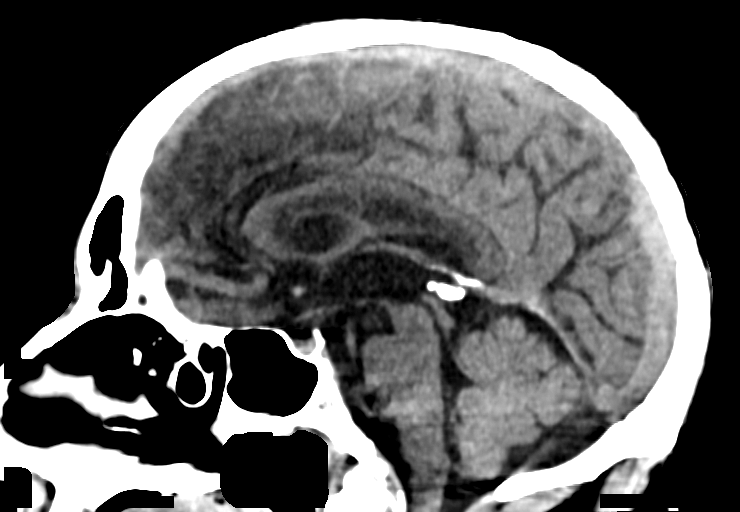
[im 39/58  brain]
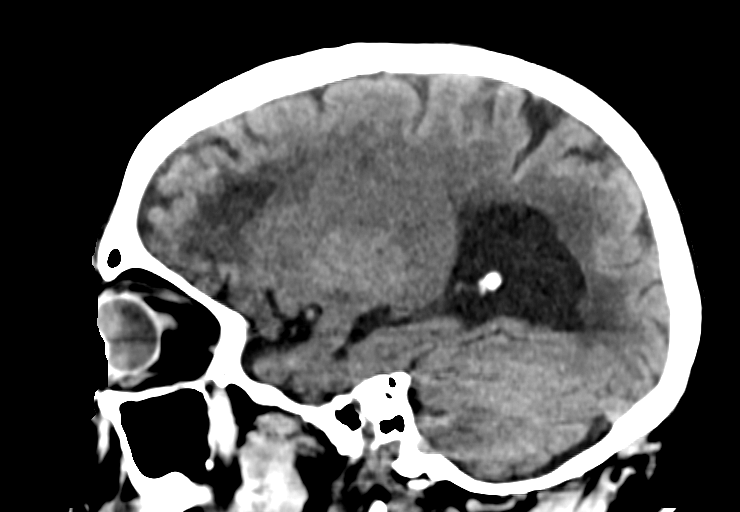

[14 of 47 positions shown; findings below may reference images not displayed]

FINDINGS: Brain: Small right anterolateral frontal cortex and white matter
infarct that is new from prior. Superficial siderosis was seen in
this area previously. Remote but progressed infarct in the lateral
and anterior left frontal cortex, small to moderate. Chronic small
vessel ischemia with low-density in the cerebral white matter.
Chronic asymmetric enlargement of the left lateral ventricle without
hydrocephalus or collection. Cerebral volume loss is overall mild
for age, including at the medial temporal lobes. No masslike
finding.

Vascular: Atherosclerotic calcification.

Skull: No acute or aggressive finding.

Sinuses/Orbits: Right maxillary sinus opacification with soft tissue
bulging at the medial wall. This is new from prior.
IMPRESSION: 1. New right and progressive left anterolateral frontal lobe
infarcts. Superficial siderosis seen in 6917 predominated in these
areas, question amyloid angiopathy.
2. Cerebral volume loss with ventriculomegaly. No progression since
1 year prior.
3. Obstructed right maxillary sinus, new from prior.

## 2019-01-23 DEATH — deceased

## 2019-02-23 DEATH — deceased
